# Patient Record
Sex: Female | Born: 1940 | Race: White | Hispanic: No | Smoking: Never smoker
Health system: Southern US, Community
[De-identification: ages and names within clinical notes are randomized; demographics above are authoritative.]

## PROBLEM LIST (undated history)

## (undated) DIAGNOSIS — E7800 Pure hypercholesterolemia, unspecified: Secondary | ICD-10-CM

## (undated) DIAGNOSIS — R0602 Shortness of breath: Secondary | ICD-10-CM

## (undated) DIAGNOSIS — F329 Major depressive disorder, single episode, unspecified: Secondary | ICD-10-CM

## (undated) DIAGNOSIS — M1990 Unspecified osteoarthritis, unspecified site: Secondary | ICD-10-CM

## (undated) DIAGNOSIS — E669 Obesity, unspecified: Secondary | ICD-10-CM

## (undated) DIAGNOSIS — R0600 Dyspnea, unspecified: Secondary | ICD-10-CM

## (undated) DIAGNOSIS — R0609 Other forms of dyspnea: Secondary | ICD-10-CM

## (undated) DIAGNOSIS — L409 Psoriasis, unspecified: Secondary | ICD-10-CM

## (undated) DIAGNOSIS — Z90710 Acquired absence of both cervix and uterus: Secondary | ICD-10-CM

## (undated) DIAGNOSIS — I10 Essential (primary) hypertension: Secondary | ICD-10-CM

## (undated) DIAGNOSIS — E119 Type 2 diabetes mellitus without complications: Secondary | ICD-10-CM

## (undated) DIAGNOSIS — L4050 Arthropathic psoriasis, unspecified: Secondary | ICD-10-CM

## (undated) DIAGNOSIS — I480 Paroxysmal atrial fibrillation: Secondary | ICD-10-CM

## (undated) DIAGNOSIS — H409 Unspecified glaucoma: Secondary | ICD-10-CM

## (undated) DIAGNOSIS — F32A Depression, unspecified: Secondary | ICD-10-CM

## (undated) HISTORY — DX: Psoriasis, unspecified: L40.9

## (undated) HISTORY — DX: Obesity, unspecified: E66.9

## (undated) HISTORY — PX: TUBAL LIGATION: SHX77

## (undated) HISTORY — DX: Dyspnea, unspecified: R06.00

## (undated) HISTORY — DX: Essential (primary) hypertension: I10

## (undated) HISTORY — PX: CARDIAC CATHETERIZATION: SHX172

## (undated) HISTORY — DX: Major depressive disorder, single episode, unspecified: F32.9

## (undated) HISTORY — DX: Paroxysmal atrial fibrillation: I48.0

## (undated) HISTORY — PX: JOINT REPLACEMENT: SHX530

## (undated) HISTORY — PX: TOTAL KNEE ARTHROPLASTY: SHX125

## (undated) HISTORY — PX: ABDOMINAL SURGERY: SHX537

## (undated) HISTORY — DX: Other forms of dyspnea: R06.09

## (undated) HISTORY — DX: Pure hypercholesterolemia, unspecified: E78.00

## (undated) HISTORY — DX: Unspecified glaucoma: H40.9

## (undated) HISTORY — DX: Acquired absence of both cervix and uterus: Z90.710

## (undated) HISTORY — DX: Arthropathic psoriasis, unspecified: L40.50

## (undated) HISTORY — DX: Shortness of breath: R06.02

## (undated) HISTORY — DX: Depression, unspecified: F32.A

## (undated) HISTORY — PX: APPENDECTOMY: SHX54

## (undated) HISTORY — DX: Type 2 diabetes mellitus without complications: E11.9

## (undated) HISTORY — PX: ABDOMINAL HYSTERECTOMY: SHX81

---

## 1997-11-15 ENCOUNTER — Other Ambulatory Visit: Admission: RE | Admit: 1997-11-15 | Discharge: 1997-11-15 | Payer: Self-pay | Admitting: *Deleted

## 1999-01-26 ENCOUNTER — Other Ambulatory Visit: Admission: RE | Admit: 1999-01-26 | Discharge: 1999-01-26 | Payer: Self-pay | Admitting: Internal Medicine

## 2008-01-12 ENCOUNTER — Encounter: Admission: RE | Admit: 2008-01-12 | Discharge: 2008-01-12 | Payer: Self-pay | Admitting: Cardiology

## 2008-02-12 ENCOUNTER — Inpatient Hospital Stay (HOSPITAL_BASED_OUTPATIENT_CLINIC_OR_DEPARTMENT_OTHER): Admission: RE | Admit: 2008-02-12 | Discharge: 2008-02-12 | Payer: Self-pay | Admitting: Cardiology

## 2010-11-07 NOTE — Cardiovascular Report (Signed)
NAMEHARSHITHA, Christina Bishop               ACCOUNT NO.:  [id number]   MEDICAL RECORD NO.:  [id number]          PATIENT TYPE:  OIB   LOCATION:  1963                         FACILITY:  [facility]   PHYSICIAN:  Georga Hacking, M.D.DATE OF BIRTH:  July 21, 1940   DATE OF PROCEDURE:  02/12/2008  DATE OF DISCHARGE:  02/12/2008                            CARDIAC CATHETERIZATION   HISTORY:  This is a 70 year old female with a history of diabetes,  hypertension, and hyperlipidemia with significant marital stress.  She  has developed worsening dyspnea on exertion with poorly-controlled  hypertension, began to have chest pain or shortness of breath.  She  presented to the office and had a Cardiolite stress test that showed  either a breast attenuation or anterior ischemia and a possibility of  transient cavity dilation.  She has a history of heart catheterization  several years ago and reported having had blockage, but we have not  received the records of this.   PROCEDURE:  Left heart catheterization with coronary angiograms and left  ventriculogram.   COMMENTS ABOUT PROCEDURE:  The patient was done in the outpatient  diagnostic laboratory.  The right femoral artery was entered using a  single anterior needle wall stick and a 4-French sheath was placed.  The  procedure was done with 4-French catheters and a 30 mL ventriculogram  was performed.  The sheath was removed in the holding area.  She  tolerated the procedure well.   HEMODYNAMIC DATA:  Aorta postcontrast 145/56, LV postcontrast 145/13-21.   ANGIOGRAPHIC DATA:  Left ventriculogram:  Performed in the 30-degree RAO  projection.  The aortic valve was normal.  Mitral valve was normal.  Left ventricle appears normal in size.  Estimated ejection fraction is  60%.  Coronary arteries arise and distribute normally.  No significant  coronary calcification is noted.  Left main coronary artery is normal.  Left anterior descending terminates on the distal  anterior wall.  There  is a small intermediate branch that arises that is free of disease.  Circumflex has 2 small marginal arteries and is free of disease.  Right  coronary artery is a large-dominant vessel.  The posterior descending  supplies the apex as does 2 posterolateral branches.  The 2  posterolateral branches are somewhat small and tortuous, but have no  severe obstructive stenoses noted.   IMPRESSION:  1. No significant obstructive coronary artery disease identified and      no coronary calcifications.  2. Normal left ventricular function.   RECOMMENDATIONS:  The patient had a recent echocardiogram and probably  has hypertensive heart disease as a cause for her dyspnea.  She has  poorly-controlled hypertension and will need better blood pressure  control.  She does not have obstructive coronary artery disease of  significance, may have hypertensive heart disease.      Georga Hacking, M.D.  Electronically Signed     WST/MEDQ  D:  02/12/2008  T:  02/12/2008  Job:  [id number]   cc:   Fara Chute

## 2010-11-10 NOTE — Cardiovascular Report (Signed)
NAMEPHILLIS, Christina Bishop               ACCOUNT NO.:  [id number]   MEDICAL RECORD NO.:  [id number]          PATIENT TYPE:  OIB   LOCATION:  1963                         FACILITY:  [facility]   PHYSICIAN:  Georga Hacking, M.D.DATE OF BIRTH:  13-Sep-1940   DATE OF PROCEDURE:  02/23/2008  DATE OF DISCHARGE:  02/12/2008                            CARDIAC CATHETERIZATION   HISTORY:  The patient is a 70 year old female with hypertension,  diabetes, and hyperlipidemia, has reported history of coronary disease.  She presented with the increasing exertional dyspnea.  Echocardiogram  shows moderate concentric LVH, but preserved systolic function.  Cardiolite testing showed breast attenuation versus anterior ischemia.  Catheterization was advised to assess for coronary disease.   PROCEDURE:  Left heart catheterization with coronary artery disease and  left ventriculogram.   COMMENTS ABOUT PROCEDURE:  The procedure was done in the outpatient  diagnostic laboratory.  The right femoral artery was entered using a  single anterior needle wall stick and 4-French sheath was inserted.  The  procedure was done with 4-French catheters and 30 mL ventriculogram was  performed.  She tolerated the procedure well.   HEMODYNAMIC DATA:  Aorta postcontrast 150/80, LV postcontrast 150/15-20.   ANGIOGRAPHIC DATA:  Left ventriculogram:  Performed in the 30-degree RAO  projection.  The aortic valve was normal.  The mitral valve was normal.  Left ventricle was normal in size.  Estimated ejection fraction is 60-  65%.  Coronary arteries arise and distribute normally.  There was no  significant coronary calcification identified.  The left main coronary  artery appears normal.  Left anterior descending has a larger diagonal  branch and the continuation branch of the LAD is somewhat smaller and  terminates on the apex.  There were scattered irregularities, but no  significant coronary obstructive disease is noted.  Circumflex  coronary  artery supplies 2 tortuous marginal arteries and is free of disease.  Right coronary artery is a large dominant vessel that appears free of  disease, posterior descending and large posterolateral branch supplied  by apex.   IMPRESSION:  1. Concentric left ventricular hypertrophy with normal systolic      function and increased left ventricular end-diastolic pressure.  2. No significant obstructive coronary artery disease was identified.   RECOMMENDATIONS:  Evaluate for other causes of dyspnea and risk factor  modification.  Weight loss, controlled with blood pressure.     Georga Hacking, M.D.  Electronically Signed    WST/MEDQ  D:  02/23/2008  T:  02/24/2008  Job:  [id number]   cc:   Fara Chute

## 2011-03-05 ENCOUNTER — Inpatient Hospital Stay (INDEPENDENT_AMBULATORY_CARE_PROVIDER_SITE_OTHER)
Admission: RE | Admit: 2011-03-05 | Discharge: 2011-03-05 | Disposition: A | Discharge status: Home / Self Care | Payer: Medicare Other | Source: Ambulatory Visit | Attending: Emergency Medicine | Admitting: Emergency Medicine

## 2011-03-05 DIAGNOSIS — IMO0002 Reserved for concepts with insufficient information to code with codable children: Secondary | ICD-10-CM

## 2011-03-05 DIAGNOSIS — S7010XA Contusion of unspecified thigh, initial encounter: Secondary | ICD-10-CM

## 2012-02-01 DIAGNOSIS — Z79899 Other long term (current) drug therapy: Secondary | ICD-10-CM | POA: Insufficient documentation

## 2012-02-01 DIAGNOSIS — H409 Unspecified glaucoma: Secondary | ICD-10-CM | POA: Insufficient documentation

## 2012-02-01 DIAGNOSIS — Z9889 Other specified postprocedural states: Secondary | ICD-10-CM

## 2012-02-01 DIAGNOSIS — L4050 Arthropathic psoriasis, unspecified: Secondary | ICD-10-CM | POA: Insufficient documentation

## 2012-02-01 DIAGNOSIS — F32A Depression, unspecified: Secondary | ICD-10-CM | POA: Insufficient documentation

## 2012-02-01 DIAGNOSIS — F329 Major depressive disorder, single episode, unspecified: Secondary | ICD-10-CM | POA: Insufficient documentation

## 2012-02-01 HISTORY — DX: Other specified postprocedural states: Z98.890

## 2012-02-01 HISTORY — DX: Other long term (current) drug therapy: Z79.899

## 2012-02-07 ENCOUNTER — Other Ambulatory Visit: Payer: Self-pay | Admitting: Obstetrics and Gynecology

## 2012-02-07 ENCOUNTER — Other Ambulatory Visit: Payer: Self-pay | Admitting: Family Medicine

## 2012-02-07 DIAGNOSIS — Z1231 Encounter for screening mammogram for malignant neoplasm of breast: Secondary | ICD-10-CM

## 2012-02-22 ENCOUNTER — Ambulatory Visit
Admission: RE | Admit: 2012-02-22 | Discharge: 2012-02-22 | Disposition: A | Discharge status: Home / Self Care | Payer: Medicare Other | Source: Ambulatory Visit | Attending: Family Medicine | Admitting: Family Medicine

## 2012-02-22 DIAGNOSIS — Z1231 Encounter for screening mammogram for malignant neoplasm of breast: Secondary | ICD-10-CM

## 2012-02-28 ENCOUNTER — Encounter: Payer: Self-pay | Admitting: Cardiology

## 2012-02-28 ENCOUNTER — Ambulatory Visit (INDEPENDENT_AMBULATORY_CARE_PROVIDER_SITE_OTHER): Payer: Medicare Other | Admitting: Cardiology

## 2012-02-28 VITALS — BP 110/74 | HR 52 | Ht 59.0 in | Wt 172.1 lb

## 2012-02-28 DIAGNOSIS — E1169 Type 2 diabetes mellitus with other specified complication: Secondary | ICD-10-CM

## 2012-02-28 DIAGNOSIS — I10 Essential (primary) hypertension: Secondary | ICD-10-CM

## 2012-02-28 DIAGNOSIS — E119 Type 2 diabetes mellitus without complications: Secondary | ICD-10-CM

## 2012-02-28 DIAGNOSIS — R0989 Other specified symptoms and signs involving the circulatory and respiratory systems: Secondary | ICD-10-CM

## 2012-02-28 DIAGNOSIS — E7800 Pure hypercholesterolemia, unspecified: Secondary | ICD-10-CM

## 2012-02-28 DIAGNOSIS — R0602 Shortness of breath: Secondary | ICD-10-CM

## 2012-02-28 DIAGNOSIS — R0600 Dyspnea, unspecified: Secondary | ICD-10-CM | POA: Insufficient documentation

## 2012-02-28 DIAGNOSIS — E669 Obesity, unspecified: Secondary | ICD-10-CM

## 2012-02-28 NOTE — Progress Notes (Signed)
Christina Bishop Date of Birth: November 26, 1940 Medical Record #[id number]  History of Present Illness: Christina Bishop is seen at the request of Cameron Sprang NP for evaluation of dyspnea. She is a pleasant 71 year old white female who states that she has been short of breath for years but that it is getting worse. She denies any chest pain. She gets short of breath now just walking across the room. Her feet swell every once in a while but this has not been a significant problem for her. Her weight has been stable over the past year. She admits that she is very sedentary and that she loves her recliner. She has had previous cardiac evaluation with cardiac catheterization in 2003 while in [place]. She also had a cardiac catheterization here in August of 2009 which was normal. She does have a history of diabetes mellitus type 2, hypertension, and hyperlipidemia. She is a nonsmoker. Current Outpatient Prescriptions on File Prior to Visit  Medication Sig Dispense Refill  . amLODipine (NORVASC) 10 MG tablet Take 10 mg by mouth daily.       Marland Kitchen atorvastatin (LIPITOR) 20 MG tablet Take by mouth daily.       . cloNIDine (CATAPRES) 0.1 MG tablet Take 0.1 mg by mouth at bedtime.      Marland Kitchen glipiZIDE (GLUCOTROL XL) 10 MG 24 hr tablet Take 10 mg by mouth daily with breakfast.       . lisinopril-hydrochlorothiazide (PRINZIDE,ZESTORETIC) 10-12.5 MG per tablet Take 1 tablet by mouth daily.       Marland Kitchen NEXIUM 40 MG capsule Take 40 mg by mouth as directed.       . ONGLYZA 5 MG TABS tablet Take 5 mg by mouth daily.       . sertraline (ZOLOFT) 100 MG tablet Take 100 mg by mouth daily.       Marland Kitchen zolpidem (AMBIEN) 5 MG tablet Take 5 mg by mouth at bedtime as needed.         Allergies  Allergen Reactions  . Codeine Itching    Past Medical History  Diagnosis Date  . DM2 (diabetes mellitus, type 2)     Uncontrolled  . HTN (hypertension)   . Psoriasis   . Psoriatic arthritis   . H/O: hysterectomy   . Depression   . SOB  (shortness of breath) on exertion   . Glaucoma   . Hypercholesterolemia     Past Surgical History  Procedure Date  . Total knee arthroplasty   . Cardiac catheterization     History  Smoking status  . Never Smoker   Smokeless tobacco  . Not on file    History  Alcohol Use No    Family History  Problem Relation Age of Onset  . Stroke Mother   . Heart attack Father     Review of Systems: The review of systems is positive for a mild chest cold that started 2 days ago. She has chronic insomnia for which she takes Ambien. She was recently started on statin therapy and her glipizide dose was increased. She reports that compared to 10-12 years ago she has gained a lot of weight. All other systems were reviewed and are negative.  Physical Exam: BP 110/74  Pulse 52  Ht 4\' 11"  (1.499 m)  Wt 78.073 kg (172 lb 1.9 oz)  BMI 34.76 kg/m2 She is a very pleasant but morbidly obese white female in no acute distress.The patient is alert and oriented x 3.  The mood and affect  are normal.  The skin is warm and dry.  Color is normal.  The HEENT exam reveals that the sclera are nonicteric.  The mucous membranes are moist.  The carotids are 2+ without bruits.  There is no thyromegaly.  There is no JVD.  The lungs are clear.  The chest wall is non tender.  The heart exam reveals a regular rate with a normal S1 and S2.  There are no murmurs, gallops, or rubs.  The PMI is not displaced.   Abdominal exam reveals good bowel sounds.  There is no guarding or rebound.  There is no hepatosplenomegaly or tenderness.  There are no masses.  Exam of the legs reveal no clubbing, cyanosis, or edema.  The legs are without rashes.  The distal pulses are intact.  Cranial nerves II - XII are intact.  Motor and sensory functions are intact.  The gait is normal.  LABORATORY DATA: ECG today demonstrates sinus bradycardia with sinus arrhythmia a rate of 52 beats per minute. It is otherwise normal. Laboratory data from  02/01/2012 shows a cholesterol of 262, triglycerides 135, LDL 157, HDL 78. Glucose of 174. BUN 31, creatinine 1.05. TSH was normal. Calcium was 10.3. Other chemistries were normal. Urinalysis was normal. CBC was normal. A1c was 7.6%.  Assessment / Plan: 1. Dyspnea. I suspect this is predominantly related to deconditioning and/or pulmonary disease. Prior cardiac evaluation has been unremarkable. She had a normal cardiac catheterization in 2009. We will update an echocardiogram. I'll also schedule her for complete pulmonary function testing. If the studies are unremarkable I will recommend a regular aerobic exercise program and weight loss.  2. Hypertension, controlled. Next  3. Hypercholesterolemia, moderate. Now atorvastatin.  4. Diabetes mellitus type 2.  5. Morbid obesity with significant deconditioning. May need to consider sleep apnea in differential. If above studies are negative we will consider a sleep study.

## 2012-02-28 NOTE — Patient Instructions (Addendum)
We will schedule you for an Echocardiogram and pulmonary function tests  Continue your current medication.  I will see you back in 2 months.

## 2012-03-05 ENCOUNTER — Other Ambulatory Visit (HOSPITAL_COMMUNITY): Payer: Medicare Other

## 2012-03-07 ENCOUNTER — Ambulatory Visit (INDEPENDENT_AMBULATORY_CARE_PROVIDER_SITE_OTHER): Payer: Medicare Other | Admitting: Internal Medicine

## 2012-03-07 DIAGNOSIS — R0609 Other forms of dyspnea: Secondary | ICD-10-CM

## 2012-03-07 DIAGNOSIS — R0989 Other specified symptoms and signs involving the circulatory and respiratory systems: Secondary | ICD-10-CM

## 2012-03-07 DIAGNOSIS — R0600 Dyspnea, unspecified: Secondary | ICD-10-CM

## 2012-03-07 DIAGNOSIS — I10 Essential (primary) hypertension: Secondary | ICD-10-CM

## 2012-03-07 DIAGNOSIS — E669 Obesity, unspecified: Secondary | ICD-10-CM

## 2012-03-07 DIAGNOSIS — E1169 Type 2 diabetes mellitus with other specified complication: Secondary | ICD-10-CM

## 2012-03-07 LAB — PULMONARY FUNCTION TEST

## 2012-03-07 NOTE — Progress Notes (Signed)
PFT done today. 

## 2012-03-18 ENCOUNTER — Telehealth: Payer: Self-pay

## 2012-03-18 ENCOUNTER — Ambulatory Visit (HOSPITAL_COMMUNITY): Payer: Medicare Other | Attending: Cardiology | Admitting: Radiology

## 2012-03-18 ENCOUNTER — Other Ambulatory Visit: Payer: Self-pay

## 2012-03-18 DIAGNOSIS — R0989 Other specified symptoms and signs involving the circulatory and respiratory systems: Secondary | ICD-10-CM | POA: Insufficient documentation

## 2012-03-18 DIAGNOSIS — E119 Type 2 diabetes mellitus without complications: Secondary | ICD-10-CM | POA: Insufficient documentation

## 2012-03-18 DIAGNOSIS — E1169 Type 2 diabetes mellitus with other specified complication: Secondary | ICD-10-CM

## 2012-03-18 DIAGNOSIS — R0609 Other forms of dyspnea: Secondary | ICD-10-CM | POA: Insufficient documentation

## 2012-03-18 DIAGNOSIS — E669 Obesity, unspecified: Secondary | ICD-10-CM

## 2012-03-18 DIAGNOSIS — I379 Nonrheumatic pulmonary valve disorder, unspecified: Secondary | ICD-10-CM | POA: Insufficient documentation

## 2012-03-18 DIAGNOSIS — R0600 Dyspnea, unspecified: Secondary | ICD-10-CM

## 2012-03-18 DIAGNOSIS — I059 Rheumatic mitral valve disease, unspecified: Secondary | ICD-10-CM | POA: Insufficient documentation

## 2012-03-18 DIAGNOSIS — I10 Essential (primary) hypertension: Secondary | ICD-10-CM | POA: Insufficient documentation

## 2012-03-18 DIAGNOSIS — I369 Nonrheumatic tricuspid valve disorder, unspecified: Secondary | ICD-10-CM | POA: Insufficient documentation

## 2012-03-18 NOTE — Progress Notes (Signed)
Echocardiogram performed.  

## 2012-03-18 NOTE — Telephone Encounter (Signed)
Patient called was told PFT's were okay.

## 2012-04-29 ENCOUNTER — Encounter: Payer: Self-pay | Admitting: Cardiology

## 2012-04-29 ENCOUNTER — Ambulatory Visit (INDEPENDENT_AMBULATORY_CARE_PROVIDER_SITE_OTHER): Payer: Medicare Other | Admitting: Cardiology

## 2012-04-29 VITALS — BP 120/66 | HR 64 | Ht <= 58 in | Wt 174.8 lb

## 2012-04-29 DIAGNOSIS — R0602 Shortness of breath: Secondary | ICD-10-CM

## 2012-04-29 DIAGNOSIS — E7800 Pure hypercholesterolemia, unspecified: Secondary | ICD-10-CM

## 2012-04-29 DIAGNOSIS — I10 Essential (primary) hypertension: Secondary | ICD-10-CM

## 2012-04-29 NOTE — Patient Instructions (Signed)
Continue to try and lose weight.  Exercise walking daily.  I will see you as needed.

## 2012-04-29 NOTE — Progress Notes (Signed)
Christina Bishop Date of Birth: [birth year] Medical Record #[id number]  History of Present Illness: Christina Bishop is seen for followup of her dyspnea. She states she is still short of breath is unchanged from her prior visit. She denies any cough. She has no edema or orthopnea.  Current Outpatient Prescriptions on File Prior to Visit  Medication Sig Dispense Refill  . amLODipine (NORVASC) 10 MG tablet Take 10 mg by mouth daily.       Marland Kitchen atorvastatin (LIPITOR) 20 MG tablet Take by mouth daily.       . Brimonidine Tartrate-Timolol (COMBIGAN OP) Apply to eye as directed.      . cloNIDine (CATAPRES) 0.1 MG tablet Take 0.1 mg by mouth at bedtime.      Marland Kitchen glipiZIDE (GLUCOTROL XL) 10 MG 24 hr tablet Take 10 mg by mouth daily with breakfast.       . lisinopril-hydrochlorothiazide (PRINZIDE,ZESTORETIC) 10-12.5 MG per tablet Take 1 tablet by mouth daily.       Marland Kitchen NEXIUM 40 MG capsule Take 40 mg by mouth as directed.       . NON FORMULARY Part of a drug study and think medication she is on is welbutrin      . ONGLYZA 5 MG TABS tablet Take 5 mg by mouth daily.       . sertraline (ZOLOFT) 100 MG tablet Take 100 mg by mouth daily.       . travoprost, benzalkonium, (TRAVATAN) 0.004 % ophthalmic solution Place 1 drop into both eyes at bedtime.      Marland Kitchen zolpidem (AMBIEN) 5 MG tablet Take 5 mg by mouth at bedtime as needed.         Allergies  Allergen Reactions  . Codeine Itching    Past Medical History  Diagnosis Date  . DM2 (diabetes mellitus, type 2)     Uncontrolled  . HTN (hypertension)   . Psoriasis   . Psoriatic arthritis   . H/O: hysterectomy   . Depression   . SOB (shortness of breath) on exertion   . Glaucoma   . Hypercholesterolemia   . Obesity     Past Surgical History  Procedure Date  . Total knee arthroplasty   . Cardiac catheterization     History  Smoking status  . Never Smoker   Smokeless tobacco  . Not on file    History  Alcohol Use No    Family History  Problem  Relation Age of Onset  . Stroke Mother   . Heart attack Father     Review of Systems: As noted in history of present illness. All other systems were reviewed and are negative.  Physical Exam: BP 120/66  Pulse 64  Ht 4\' 10"  (1.473 m)  Wt 79.289 kg (174 lb 12.8 oz)  BMI 36.53 kg/m2 She is a very pleasant but morbidly obese white female in no acute distress. The HEENT exam is normal.  The carotids are 2+ without bruits.  There is no thyromegaly.  There is no JVD.  The lungs are clear.  The chest wall is non tender.  The heart exam reveals a regular rate with a normal S1 and S2.  There are no murmurs, gallops, or rubs.  The PMI is not displaced.   Abdominal exam is obese. There are no masses.  Exam of the legs reveal no clubbing, cyanosis, or edema.  The legs are without rashes.  The distal pulses are intact.  Cranial nerves II - XII are intact.  Motor and sensory functions are intact.  The gait is normal.  LABORATORY DATA: Recent pulmonary function studies showed mild obstructive deficit with some response to bronchodilators. Diffusion capacity was moderately reduced. Next  Echo:Study Conclusions  Left ventricle: The cavity size was normal. Wall thickness was normal. Systolic function was normal. The estimated ejection fraction was in the range of 55% to 60%. Wall motion was normal; there were no regional wall motion abnormalities. Doppler parameters are consistent with abnormal left ventricular relaxation (grade 1 diastolic dysfunction).    Assessment / Plan: 1. Dyspnea. Based on her evaluation today I do not feel that her symptoms are cardiac related. Her echocardiogram was unremarkable. She had a normal cardiac catheterization in 2009. Pulmonary function studies are mildly impaired. Patient states she does not think she has sleep apnea. She is reassured by the above findings. She would like to manage conservatively. I strongly recommended weight loss and regular aerobic exercise. If  she continues to have significant dyspnea I would recommend pulmonary evaluation. I will see back as needed.  2. Hypertension, controlled.   3. Hypercholesterolemia, moderate. Now atorvastatin.  4. Diabetes mellitus type 2.  5. Morbid obesity with significant deconditioning.

## 2013-02-03 ENCOUNTER — Other Ambulatory Visit: Payer: Self-pay

## 2013-02-03 DIAGNOSIS — Z1231 Encounter for screening mammogram for malignant neoplasm of breast: Secondary | ICD-10-CM

## 2013-02-13 ENCOUNTER — Ambulatory Visit (INDEPENDENT_AMBULATORY_CARE_PROVIDER_SITE_OTHER): Payer: Medicare Other | Admitting: Internal Medicine

## 2013-02-13 ENCOUNTER — Encounter: Payer: Self-pay | Admitting: Internal Medicine

## 2013-02-13 VITALS — BP 122/74 | HR 78 | Temp 98.7°F | Ht <= 58 in | Wt 170.0 lb

## 2013-02-13 DIAGNOSIS — I10 Essential (primary) hypertension: Secondary | ICD-10-CM

## 2013-02-13 DIAGNOSIS — R0602 Shortness of breath: Secondary | ICD-10-CM

## 2013-02-13 MED ORDER — LOSARTAN POTASSIUM-HCTZ 50-12.5 MG PO TABS: 1.0000 | ORAL_TABLET | Freq: Every day | ORAL | Status: DC

## 2013-02-13 NOTE — Progress Notes (Signed)
  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  Already eval  02/2012 by Dr Peter [place] with neg cardiac w/u.  No obvious day to day or  daytime variabilty or assoc cp or chest tightness, subjective wheeze overt sinus or hb symptoms. No unusual exp hx or h/o childhood pna/ asthma or knowledge of premature birth.   Sleeping ok without nocturnal  or early am exacerbation  of respiratory  c/o's or need for noct saba. Also denies any obvious fluctuation of symptoms with weather or environmental changes or other aggravating or alleviating factors except as outlined above   Current Medications, Allergies, Past Medical History, Past Surgical History, Family History, and Social History were reviewed in [employer] record.    Review of Systems  Constitutional: Negative for fever, chills, diaphoresis, activity change, appetite change, fatigue and unexpected weight change.  HENT: Positive for ear pain. Negative for hearing loss, nosebleeds, congestion, sore throat, facial swelling, rhinorrhea, sneezing, mouth sores, trouble swallowing, neck pain, neck stiffness, dental problem, voice change, postnasal drip, sinus pressure, tinnitus and ear discharge.   Eyes: Negative for photophobia, discharge, itching and visual disturbance.  Respiratory: Positive for cough. Negative for apnea, choking, chest tightness, shortness of breath, wheezing and stridor.   Cardiovascular: Negative for chest pain, palpitations and leg  swelling.  Gastrointestinal: Negative for nausea, vomiting, abdominal pain, constipation, blood in stool and abdominal distention.       Indigestion  Genitourinary: Negative for dysuria, urgency, frequency, hematuria, flank pain, decreased urine volume and difficulty urinating.  Musculoskeletal: Positive for myalgias. Negative for back pain, joint swelling, arthralgias and gait problem.  Skin: Positive for rash. Negative for color change and pallor.  Neurological: Negative for dizziness, tremors, seizures, syncope, speech difficulty, weakness, light-headedness, numbness and headaches.  Hematological: Negative for adenopathy. Does not bruise/bleed easily.  Psychiatric/Behavioral: Positive for dysphoric mood. Negative for confusion, sleep disturbance and agitation. The patient is nervous/anxious.        Objective:   Physical Exam  amb wf nad  Wt Readings from Last 3 Encounters:  02/13/13 170 lb (77.111 kg)  04/29/12 174 lb 12.8 oz (79.289 kg)  02/28/12 172 lb 1.9 oz (78.073 kg)    HEENT: nl dentition, turbinates, and orophanx. Nl external ear canals without cough reflex   NECK :  without JVD/Nodes/TM/ nl carotid upstrokes bilaterally   LUNGS: no acc muscle use, clear to A and P bilaterally without cough on insp or exp maneuvers   CV:  RRR  no s3 or murmur or increase in P2, no edema   ABD:  soft and nontender with nl excursion in the supine position. No bruits or organomegaly, bowel sounds nl  MS:  warm without deformities, calf tenderness, cyanosis or clubbing  SKIN: warm and dry without lesions    NEURO:  alert, approp, no deficits          Assessment & Plan:

## 2013-02-13 NOTE — Patient Instructions (Addendum)
Stop lisinopril  Start losartan 50/12.5 one daily in its place  GERD (REFLUX)  is an extremely common cause of respiratory symptoms just like yours, many times with no significant heartburn at all.    It can be treated with medication, but also with lifestyle changes including avoidance of late meals, excessive alcohol, smoking cessation, and avoid fatty foods, chocolate, peppermint, colas, red wine, and acidic juices such as orange juice.  NO MINT OR MENTHOL PRODUCTS SO NO COUGH DROPS  USE SUGARLESS CANDY INSTEAD (jolley ranchers or Stover's)  NO OIL BASED VITAMINS - use powdered substitutes.  Please schedule a follow up office visit in 6 weeks, call sooner if needed

## 2013-02-14 NOTE — Assessment & Plan Note (Addendum)
-   PFT's 03/07/12 FEV1  1.28 ( 80%) ratio 70 and DLCO 53 corrects to 104  ERV 39% c/w  normal lungs but extrinsic effects of obesity - 02/13/2013  Walked RA x 3 laps @ 185 ft each stopped due to end of study no desat   Symptoms are markedly disproportionate to objective findings and not clear this is a lung problem but pt does appear to have difficult airway management issues.  DDX of  difficult airways managment all start with A and  include Adherence, Ace Inhibitors, Acid Reflux, Active Sinus Disease, Alpha 1 Antitripsin deficiency, Anxiety masquerading as Airways dz,  ABPA,  allergy(esp in young), Aspiration (esp in elderly), Adverse effects of DPI,  Active smokers, plus two Bs  = Bronchiectasis and Beta blocker use..and one C= CHF  acei at top of list based on previous studies showing no airflow obst and assoc dry cough.  Acid reflux related to obesity/ aging also a concern > diet reviewed in addition to timing of daily ppi maint rx which she's already doing  Will ask her to return in 6 weeks to regroup

## 2013-02-14 NOTE — Assessment & Plan Note (Addendum)
ACE inhibitors are problematic in  pts with airway complaints because  even experienced pulmonologists can't always distinguish ace effects from copd/asthma.  By themselves they don't actually cause a problem, much like oxygen can't by itself start a fire, but they certainly serve as a powerful catalyst or enhancer for any "fire"  or inflammatory process in the upper airway, be it caused by an ET  tube or more commonly reflux (especially in the obese or pts with known GERD (as is the case here and can be a problem triggering ace intolerance even in the absence of HB)  or who are on biphoshonates).    In the era of ARB near equivalency ( at least in short run)  until we have a better handle on the reversibility of the airway problem, it just makes sense to avoid ACEI  entirely in the short run and then decide later, having established a level of airway control using a reasonable limited regimen, whether to add back ace but even then being very careful to observe the pt for worsening airway control and number of meds used/ needed to control symptoms.    Stop lisinopril  Start trial losartan 50/12.5 one daily in its place

## 2013-03-02 ENCOUNTER — Ambulatory Visit: Payer: Medicare Other

## 2013-03-27 ENCOUNTER — Encounter: Payer: Self-pay | Admitting: Internal Medicine

## 2013-03-27 ENCOUNTER — Ambulatory Visit (INDEPENDENT_AMBULATORY_CARE_PROVIDER_SITE_OTHER): Payer: Medicare Other | Admitting: Internal Medicine

## 2013-03-27 VITALS — BP 118/66 | HR 65 | Temp 98.1°F | Ht 58.5 in | Wt 173.0 lb

## 2013-03-27 DIAGNOSIS — R0602 Shortness of breath: Secondary | ICD-10-CM

## 2013-03-27 DIAGNOSIS — I10 Essential (primary) hypertension: Secondary | ICD-10-CM

## 2013-03-27 DIAGNOSIS — K219 Gastro-esophageal reflux disease without esophagitis: Secondary | ICD-10-CM

## 2013-03-27 HISTORY — DX: Gastro-esophageal reflux disease without esophagitis: K21.9

## 2013-03-27 NOTE — Patient Instructions (Addendum)
If you are satisfied with your treatment plan let your doctor know and he/she can either refill your medications or you can return here when your prescription runs out.     If in any way you are not 100% satisfied,  please tell us.  If 100% better, tell your friends!  

## 2013-03-27 NOTE — Assessment & Plan Note (Addendum)
-   PFT's 03/07/12 FEV1  1.28 ( 80%) ratio 70 and DLCO 53 corrects to 104  ERV 39%  - 02/13/2013  Walked RA x 3 laps @ 185 ft each stopped due to end of study no desat  - Trial off ACEi 02/14/2013 > all symptoms resolved   Clearly this was an usual ace case, resolved off acei ( which probably served to accentuate the uppers airway  effects of acid reflux, which she still has but much milder off acei)  no further pulmonary f/u needed    Each maintenance medication was reviewed in detail including most importantly the difference between maintenance and as needed and under what circumstances the prns are to be used.  Please see instructions for details which were reviewed in writing and the patient given a copy.

## 2013-03-27 NOTE — Progress Notes (Signed)
  Subjective:    Patient ID: Christina Bishop, female    DOB: 17-Jul-1940 MRN: [id number]    Brief patient profile:  66 yowf never smoker with h/o psoriatic arthritis rx in [facility] by dermatologist referred 02/13/2013 by Dr Florentina Addison bland for doe onset 2009  History of Present Illness  02/13/2013 1st [facility] office visit/ Wert on ACEi cc indolent onset doe x 5 years mildly progressive to point where walking to [employer] at moderate pace about a block away from appt up slt inclline on way back  and steps stops p one flight and rests, assoc with variable dry cough.  Already eval  02/2012 by Dr Peter [place] with neg cardiac w/u. rec Stop lisinopril Start losartan 50/12.5 one daily in its place GERD diet    03/27/2013 f/u ov/Wert re: sob ? acei side effect Chief Complaint  Patient presents with  . Follow-up    Pt states her breathing is much improved- "I feel like a new person". No co's today.    not limited at all by sob    No obvious day to day or daytime variabilty or assoc chronic cough or cp or chest tightness, subjective wheeze overt sinus or hb symptoms. No unusual exp hx or h/o childhood pna/ asthma or knowledge of premature birth.  Sleeping ok without nocturnal  or early am exacerbation  of respiratory  c/o's or need for noct saba. Also denies any obvious fluctuation of symptoms with weather or environmental changes or other aggravating or alleviating factors except as outlined above   Current Medications, Allergies, Complete Past Medical History, Past Surgical History, Family History, and Social History were reviewed in [employer] record.  ROS  The following are not active complaints unless bolded sore throat, dysphagia, dental problems, itching, sneezing,  nasal congestion or excess/ purulent secretions, ear ache,   fever, chills, sweats, unintended wt loss, pleuritic or exertional cp, hemoptysis,  orthopnea pnd or leg swelling, presyncope,  palpitations, heartburn, abdominal pain, anorexia, nausea, vomiting, diarrhea  or change in bowel or urinary habits, change in stools or urine, dysuria,hematuria,  rash, arthralgias, visual complaints, headache, numbness weakness or ataxia or problems with walking or coordination,  change in mood/affect or memory.              Objective:   Physical Exam  amb wf nad   03/27/2013       173  Wt Readings from Last 3 Encounters:  02/13/13 170 lb (77.111 kg)  04/29/12 174 lb 12.8 oz (79.289 kg)  02/28/12 172 lb 1.9 oz (78.073 kg)    HEENT: nl dentition, turbinates, and orophanx. Nl external ear canals without cough reflex   NECK :  without JVD/Nodes/TM/ nl carotid upstrokes bilaterally   LUNGS: no acc muscle use, clear to A and P bilaterally without cough on insp or exp maneuvers   CV:  RRR  no s3 or murmur or increase in P2, no edema   ABD:  soft and nontender with nl excursion in the supine position. No bruits or organomegaly, bowel sounds nl  MS:  warm without deformities, calf tenderness, cyanosis or clubbing  SKIN: warm and dry without lesions    NEURO:  alert, approp, no deficits          Assessment & Plan:

## 2013-03-27 NOTE — Assessment & Plan Note (Signed)
She says still has over hb when misses a dose of nexium but not really following diet.  On diet and off acei she may be able to wean off nexium or use cheaper alternative but defer rx to Dr Lowella Bandy

## 2013-03-27 NOTE — Assessment & Plan Note (Signed)
Adequate control on present rx, reviewed > no change in rx needed    rec avoid acei indefinitely

## 2013-04-07 ENCOUNTER — Ambulatory Visit: Payer: Medicare Other

## 2013-04-29 ENCOUNTER — Ambulatory Visit: Payer: Medicare Other

## 2014-03-26 ENCOUNTER — Observation Stay (HOSPITAL_COMMUNITY)
Admission: EM | Admit: 2014-03-26 | Discharge: 2014-03-27 | Disposition: A | Discharge status: Home / Self Care | Payer: Medicare Other | Attending: Internal Medicine | Admitting: Internal Medicine

## 2014-03-26 ENCOUNTER — Encounter (HOSPITAL_COMMUNITY): Payer: Self-pay | Admitting: Emergency Medicine

## 2014-03-26 DIAGNOSIS — E669 Obesity, unspecified: Secondary | ICD-10-CM | POA: Insufficient documentation

## 2014-03-26 DIAGNOSIS — H409 Unspecified glaucoma: Secondary | ICD-10-CM | POA: Diagnosis not present

## 2014-03-26 DIAGNOSIS — E7800 Pure hypercholesterolemia, unspecified: Secondary | ICD-10-CM | POA: Diagnosis present

## 2014-03-26 DIAGNOSIS — I10 Essential (primary) hypertension: Secondary | ICD-10-CM | POA: Diagnosis not present

## 2014-03-26 DIAGNOSIS — E119 Type 2 diabetes mellitus without complications: Secondary | ICD-10-CM | POA: Diagnosis not present

## 2014-03-26 DIAGNOSIS — I4891 Unspecified atrial fibrillation: Secondary | ICD-10-CM | POA: Diagnosis not present

## 2014-03-26 DIAGNOSIS — Z794 Long term (current) use of insulin: Secondary | ICD-10-CM | POA: Insufficient documentation

## 2014-03-26 DIAGNOSIS — K219 Gastro-esophageal reflux disease without esophagitis: Secondary | ICD-10-CM | POA: Diagnosis present

## 2014-03-26 DIAGNOSIS — R55 Syncope and collapse: Principal | ICD-10-CM | POA: Diagnosis present

## 2014-03-26 DIAGNOSIS — Z872 Personal history of diseases of the skin and subcutaneous tissue: Secondary | ICD-10-CM | POA: Diagnosis not present

## 2014-03-26 DIAGNOSIS — Z90710 Acquired absence of both cervix and uterus: Secondary | ICD-10-CM | POA: Insufficient documentation

## 2014-03-26 DIAGNOSIS — Z79899 Other long term (current) drug therapy: Secondary | ICD-10-CM | POA: Insufficient documentation

## 2014-03-26 DIAGNOSIS — F329 Major depressive disorder, single episode, unspecified: Secondary | ICD-10-CM | POA: Insufficient documentation

## 2014-03-26 DIAGNOSIS — Z9889 Other specified postprocedural states: Secondary | ICD-10-CM | POA: Diagnosis not present

## 2014-03-26 DIAGNOSIS — R112 Nausea with vomiting, unspecified: Secondary | ICD-10-CM | POA: Insufficient documentation

## 2014-03-26 DIAGNOSIS — I480 Paroxysmal atrial fibrillation: Secondary | ICD-10-CM

## 2014-03-26 HISTORY — DX: Unspecified atrial fibrillation: I48.91

## 2014-03-26 LAB — CBC
HCT: 42.5 % (ref 36.0–46.0)
Hemoglobin: 14.7 g/dL (ref 12.0–15.0)
MCH: 28.4 pg (ref 26.0–34.0)
MCHC: 34.6 g/dL (ref 30.0–36.0)
MCV: 82.2 fL (ref 78.0–100.0)
Platelets: 310 10*3/uL (ref 150–400)
RBC: 5.17 MIL/uL — ABNORMAL HIGH (ref 3.87–5.11)
RDW: 13.1 % (ref 11.5–15.5)
WBC: 10.3 10*3/uL (ref 4.0–10.5)

## 2014-03-26 LAB — BASIC METABOLIC PANEL
Anion gap: 15 (ref 5–15)
BUN: 15 mg/dL (ref 6–23)
CO2: 26 mEq/L (ref 19–32)
Calcium: 9.5 mg/dL (ref 8.4–10.5)
Chloride: 99 mEq/L (ref 96–112)
Creatinine, Ser: 0.85 mg/dL (ref 0.50–1.10)
GFR calc Af Amer: 78 mL/min — ABNORMAL LOW (ref >90)
GFR calc non Af Amer: 67 mL/min — ABNORMAL LOW (ref >90)
Glucose, Bld: 258 mg/dL — ABNORMAL HIGH (ref 70–99)
Potassium: 3.9 mEq/L (ref 3.7–5.3)
Sodium: 140 mEq/L (ref 137–147)

## 2014-03-26 LAB — URINALYSIS, ROUTINE W REFLEX MICROSCOPIC
Bilirubin Urine: NEGATIVE
Glucose, UA: 250 mg/dL — AB
Hgb urine dipstick: NEGATIVE
Ketones, ur: 15 mg/dL — AB
Leukocytes, UA: NEGATIVE
Nitrite: NEGATIVE
Protein, ur: NEGATIVE mg/dL
Specific Gravity, Urine: 1.024 (ref 1.005–1.030)
Urobilinogen, UA: 0.2 mg/dL (ref 0.0–1.0)
pH: 6.5 (ref 5.0–8.0)

## 2014-03-26 LAB — TROPONIN I: Troponin I: <0.3 ng/mL (ref <0.30)

## 2014-03-26 LAB — I-STAT TROPONIN, ED: Troponin i, poc: 0 ng/mL (ref 0.00–0.08)

## 2014-03-26 LAB — TSH: TSH: 4.01 u[IU]/mL (ref 0.350–4.500)

## 2014-03-26 LAB — GLUCOSE, CAPILLARY: Glucose-Capillary: 154 mg/dL — ABNORMAL HIGH (ref 70–99)

## 2014-03-26 LAB — CBG MONITORING, ED: Glucose-Capillary: 285 mg/dL — ABNORMAL HIGH (ref 70–99)

## 2014-03-26 MED ORDER — INSULIN ASPART 100 UNIT/ML ~~LOC~~ SOLN
0.0000 [IU] | Freq: Three times a day (TID) | SUBCUTANEOUS | Status: DC
  Administered 2014-03-27: 3 [IU] via SUBCUTANEOUS

## 2014-03-26 MED ORDER — SERTRALINE HCL 100 MG PO TABS
100.0000 mg | ORAL_TABLET | Freq: Every day | ORAL | Status: DC
  Administered 2014-03-26 – 2014-03-27 (×2): 100 mg via ORAL
  Filled 2014-03-26 (×2): qty 1

## 2014-03-26 MED ORDER — LOSARTAN POTASSIUM 50 MG PO TABS
50.0000 mg | ORAL_TABLET | Freq: Every day | ORAL | Status: DC
Start: 2014-03-27 — End: 2014-03-27
  Administered 2014-03-27: 50 mg via ORAL
  Filled 2014-03-26: qty 1

## 2014-03-26 MED ORDER — HYDROCHLOROTHIAZIDE 12.5 MG PO CAPS
12.5000 mg | ORAL_CAPSULE | Freq: Every day | ORAL | Status: DC
  Administered 2014-03-27: 12.5 mg via ORAL
  Filled 2014-03-26: qty 1

## 2014-03-26 MED ORDER — OFF THE BEAT BOOK
Freq: Once | Status: AC
  Administered 2014-03-26: 22:00:00
  Filled 2014-03-26: qty 1

## 2014-03-26 MED ORDER — PANTOPRAZOLE SODIUM 40 MG PO TBEC
80.0000 mg | DELAYED_RELEASE_TABLET | Freq: Every day | ORAL | Status: DC
  Administered 2014-03-27: 80 mg via ORAL
  Filled 2014-03-26: qty 2

## 2014-03-26 MED ORDER — TRAVOPROST 0.004 % OP SOLN: 1.0000 [drp] | Freq: Every day | OPHTHALMIC | Status: DC

## 2014-03-26 MED ORDER — INSULIN DETEMIR 100 UNIT/ML FLEXPEN: 12.0000 [IU] | Freq: Every day | SUBCUTANEOUS | Status: DC

## 2014-03-26 MED ORDER — ATORVASTATIN CALCIUM 20 MG PO TABS
20.0000 mg | ORAL_TABLET | Freq: Every day | ORAL | Status: DC
  Administered 2014-03-26: 20 mg via ORAL
  Filled 2014-03-26 (×2): qty 1

## 2014-03-26 MED ORDER — GLIPIZIDE 10 MG PO TABS
10.0000 mg | ORAL_TABLET | Freq: Two times a day (BID) | ORAL | Status: DC
  Administered 2014-03-27: 10 mg via ORAL
  Filled 2014-03-26 (×3): qty 1

## 2014-03-26 MED ORDER — LATANOPROST 0.005 % OP SOLN
1.0000 [drp] | Freq: Every day | OPHTHALMIC | Status: DC
  Administered 2014-03-26: 1 [drp] via OPHTHALMIC
  Filled 2014-03-26: qty 2.5

## 2014-03-26 MED ORDER — METOPROLOL TARTRATE 12.5 MG HALF TABLET
12.5000 mg | ORAL_TABLET | Freq: Two times a day (BID) | ORAL | Status: DC
  Administered 2014-03-26 – 2014-03-27 (×2): 12.5 mg via ORAL
  Filled 2014-03-26 (×3): qty 1

## 2014-03-26 MED ORDER — INSULIN DETEMIR 100 UNIT/ML ~~LOC~~ SOLN
12.0000 [IU] | Freq: Every day | SUBCUTANEOUS | Status: DC
  Administered 2014-03-26: 12 [IU] via SUBCUTANEOUS
  Filled 2014-03-26 (×3): qty 0.12

## 2014-03-26 MED ORDER — ONDANSETRON HCL 4 MG/2ML IJ SOLN: 4.0000 mg | Freq: Four times a day (QID) | INTRAMUSCULAR | Status: DC | PRN

## 2014-03-26 MED ORDER — CLONIDINE HCL 0.1 MG PO TABS
0.1000 mg | ORAL_TABLET | Freq: Every day | ORAL | Status: DC
  Administered 2014-03-26: 0.1 mg via ORAL
  Filled 2014-03-26 (×2): qty 1

## 2014-03-26 MED ORDER — ACETAMINOPHEN 325 MG PO TABS: 650.0000 mg | ORAL_TABLET | ORAL | Status: DC | PRN

## 2014-03-26 MED ORDER — APIXABAN 5 MG PO TABS
5.0000 mg | ORAL_TABLET | Freq: Two times a day (BID) | ORAL | Status: DC
  Administered 2014-03-26 – 2014-03-27 (×2): 5 mg via ORAL
  Filled 2014-03-26 (×3): qty 1

## 2014-03-26 MED ORDER — LOSARTAN POTASSIUM-HCTZ 50-12.5 MG PO TABS: 1.0000 | ORAL_TABLET | Freq: Every day | ORAL | Status: DC

## 2014-03-26 NOTE — ED Notes (Signed)
Per EMS: standing in line at back when vision started to go black, patient sat down.  Per witnesses, patient passed out and was out for several minutes, upon awakening patient was nauseated and was vomiting.  Patient was clammy on ems arrival, 12 lead showed afib (80-100), patient denies hx of afib.  Patient states she feels weak.  Given zofran by ems, patient states she still feels sick though is not actively vomiting.

## 2014-03-26 NOTE — ED Provider Notes (Signed)
CSN: [id number]     Arrival date & time 03/26/14  1333 History   First MD Initiated Contact with Patient 03/26/14 1419     Chief Complaint  Patient presents with  . Loss of Consciousness  . Atrial Fibrillation  . Nausea     (Consider location/radiation/quality/duration/timing/severity/associated sxs/prior Treatment) Patient is a 73 y.o. female presenting with syncope.  Loss of Consciousness Episode history:  Single Most recent episode:  Today Duration: brief. Timing:  Constant Progression:  Resolved Chronicity:  New Context comment:  Standing in line at the bank Witnessed: yes   Relieved by:  Lying down Associated symptoms: diaphoresis, dizziness, nausea, palpitations (last night) and vomiting   Associated symptoms: no chest pain and no shortness of breath     Past Medical History  Diagnosis Date  . DM2 (diabetes mellitus, type 2)     Uncontrolled  . HTN (hypertension)   . Psoriasis   . Psoriatic arthritis   . H/O: hysterectomy   . Depression   . SOB (shortness of breath) on exertion   . Glaucoma   . Hypercholesterolemia   . Obesity    Past Surgical History  Procedure Laterality Date  . Total knee arthroplasty    . Cardiac catheterization     Family History  Problem Relation Age of Onset  . Stroke Mother   . Heart attack Father    History  Substance Use Topics  . Smoking status: Never Smoker   . Smokeless tobacco: Not on file  . Alcohol Use: No   OB History   Grav Para Term Preterm Abortions TAB SAB Ect Mult Living                 Review of Systems  Constitutional: Positive for diaphoresis.  Respiratory: Negative for shortness of breath.   Cardiovascular: Positive for palpitations (last night) and syncope. Negative for chest pain.  Gastrointestinal: Positive for nausea and vomiting.  Neurological: Positive for dizziness.  All other systems reviewed and are negative.     Allergies  Codeine  Home Medications   Prior to Admission medications    Medication Sig Start Date End Date Taking? Authorizing Provider  amLODipine (NORVASC) 10 MG tablet Take 10 mg by mouth daily.  02/24/12  Yes Historical Provider, MD  atorvastatin (LIPITOR) 20 MG tablet Take 20 mg by mouth daily.  02/06/12  Yes Historical Provider, MD  Brimonidine Tartrate-Timolol (COMBIGAN OP) Place 1 drop into both eyes 2 (two) times daily.    Yes Historical Provider, MD  cloNIDine (CATAPRES) 0.1 MG tablet Take 0.1 mg by mouth at bedtime.   Yes Historical Provider, MD  glipiZIDE (GLUCOTROL) 10 MG tablet Take 10 mg by mouth 2 (two) times daily before a meal.   Yes Historical Provider, MD  insulin detemir (LEVEMIR) 100 unit/ml SOLN Inject 12 Units into the skin at bedtime.   Yes Historical Provider, MD  losartan-hydrochlorothiazide (HYZAAR) 50-12.5 MG per tablet Take 1 tablet by mouth daily. 02/13/13  Yes Tanda Rockers, MD  NEXIUM 40 MG capsule Take 40 mg by mouth daily.  01/29/12  Yes Historical Provider, MD  sertraline (ZOLOFT) 100 MG tablet Take 100 mg by mouth daily.  02/24/12  Yes Historical Provider, MD  sitaGLIPtin (JANUVIA) 100 MG tablet Take 100 mg by mouth daily.   Yes Historical Provider, MD  travoprost, benzalkonium, (TRAVATAN) 0.004 % ophthalmic solution Place 1 drop into both eyes at bedtime.   Yes Historical Provider, MD   BP 124/84  Pulse 84  Temp(Src) 97.6 F (36.4 C) (Oral)  Resp 19  Ht 4\' 11"  (1.499 m)  Wt 166 lb (75.297 kg)  BMI 33.51 kg/m2  SpO2 98% Physical Exam  Nursing note and vitals reviewed. Constitutional: She is oriented to person, place, and time. She appears well-developed and well-nourished. No distress.  HENT:  Head: Normocephalic and atraumatic.  Mouth/Throat: Oropharynx is clear and moist.  Eyes: Conjunctivae are normal. Pupils are equal, round, and reactive to light. No scleral icterus.  Neck: Neck supple.  Cardiovascular: Normal rate, normal heart sounds and intact distal pulses.  An irregularly irregular rhythm present.  No murmur  heard. Pulmonary/Chest: Effort normal and breath sounds normal. No stridor. No respiratory distress. She has no rales.  Abdominal: Soft. Bowel sounds are normal. She exhibits no distension. There is no tenderness.  Musculoskeletal: Normal range of motion.  Neurological: She is alert and oriented to person, place, and time.  Skin: Skin is warm and dry. No rash noted.  Psychiatric: She has a normal mood and affect. Her behavior is normal.    ED Course  Procedures (including critical care time) Labs Review Labs Reviewed  CBC - Abnormal; Notable for the following:    RBC 5.17 (*)    All other components within normal limits  BASIC METABOLIC PANEL - Abnormal; Notable for the following:    Glucose, Bld 258 (*)    GFR calc non Af Amer 67 (*)    GFR calc Af Amer 78 (*)    All other components within normal limits  URINALYSIS, ROUTINE W REFLEX MICROSCOPIC - Abnormal; Notable for the following:    Glucose, UA 250 (*)    Ketones, ur 15 (*)    All other components within normal limits  CBG MONITORING, ED - Abnormal; Notable for the following:    Glucose-Capillary 285 (*)    All other components within normal limits  I-STAT TROPOININ, ED    Imaging Review No results found.   EKG Interpretation   Date/Time:  Friday March 26 2014 14:00:12 EDT Ventricular Rate:  100 PR Interval:    QRS Duration: 80 QT Interval:  384 QTC Calculation: 495 R Axis:   -40 Text Interpretation:  Atrial fibrillation Left anterior fascicular block  Low voltage, precordial leads Abnormal R-wave progression, early  transition Borderline prolonged QT interval No old tracing to compare  Confirmed by [facility]  MD, TREY ([id number]) on 03/26/2014 5:00:55 PM      MDM   Final diagnoses:  Syncope, unspecified syncope type  Atrial fibrillation, unspecified    73 yo female with syncope while standing in line at the bank.  No apparent traumatic injuries, pt was seated at time of syncope, does not think she fell to the  floor.  She was found to be in new onset a fib.  Rate normal currently.  Labs unremarkable.  Cardiology have been consulted.      Artis Delay, MD 03/26/14 [phone]

## 2014-03-26 NOTE — ED Notes (Signed)
Attempted report 

## 2014-03-26 NOTE — H&P (Signed)
Referring Physician:  ED Physician Primary Physician: Elyn Peers, MD Primary Cardiologist: Dr. Martinique   HPI:  Ms. Trim is a 73 year old female with past medical history significant for hypertension, hyperlipidemia, diabetes and obesity. She had a negative cath in 2009 which showed essentially normal coronary arteries with normal ejection fraction. Her last echocardiogram was obtained on 03/18/2012 which showed EF 09-81%, grade 1 diastolic dysfunction. She was last seen by Dr. Martinique on 04/29/2012, during which visit, Dr. Martinique reviewed the previous cath results and the echo result and felt that her dyspnea on exertion was unlikely to be related to cardiac etiology. She was eventually referred to Dr. Melvyn Novas with pulmonology for further evaluation. After evaluation by pulmonology, it was felt that her dyspnea was related to lisinopril, therefore lisinopril was discontinued and she was started on losartan. She states her dyspnea on exertion improved after that. According to the patient, she had intermittent irregular rhythm once every few months for the past several years. She has not seen a cardiologist for her irregular rhythm before.  She denies any recent chest pain, significant lower extremity edema, orthopnea, proximal nocturnal dyspnea, fever, chill, or cough. She states she has noticed increased dyspnea associated with exertion in the recent few months. This was new since her she stopped her lisinopril years ago. For the past 2 weeks, she has not been feeling well with right lower abdominal discomfort. She sought help with her PCP, and was told it was related to arthritis. Patient has also noted intermittent dizzy spells. She states she had a total of 8 falls in the last year. She describes herself as very clumsy. She also states her physical therapist told her she has some imbalance issue when she walks while turning her head, however no imbalance when she walk straight. She states she has  been compliant with her medication at home.  She went to sleep on the night of 03/25/2014 feeling her heart was out of rhythm with mild palpitation. She went to [employer] in the morning of 03/26/2014. After eating at [facility] inside [place], she went to the in-store bank. Shortly after getting up from [employer] and walk toward the bank, she had dizziness, diaphoresis and feeling clammy. She sat down in a nearby chair and felt she lost her consciousness for several minutes. She regained her consciousness after a bank teller tried to wake her up. When she woke up, she felt nausea and had vomiting. She was subsequently transferred to [facility] for [facility] evaluation. Upon arrival, her blood pressure was 116/78. O2 saturation 100% on room air. She was noted to be tachycardic with heart rate 125. Laboratory finding were significant for creatinine of 0.85, negative troponin, glucose was elevated at 258. EKG showed atrial fibrillation with heart rate 100, poor R wave progression in anterior leads, left anterior fascicular block. She has no prior documented EKG of atrial fibrillation, although patient states she has been having occasional irregular rhythm for the past several years.   Review of Systems:     Cardiac Review of Systems: {Y] = yes [ ]  = no  Chest Pain [  No  ]  Resting SOB [  No ] Exertional SOB  [ yes  ]  Orthopnea [ no ]   Pedal Edema [ nonpitting only  ]    Palpitations [  ] Syncope  [ yes  ]   Presyncope [   ]   General Review of Systems: [Y] = yes [  ]=no Constitional:  recent weight change [ no]; anorexia [  ]; fatigue [ no ]; nausea [ yes ]; night sweats [  ]; fever [ no ]; or chills [no  ];                                                                     Eyes : blurred vision [  ]; diplopia [   ]; vision changes [  ];  Amaurosis fugax[  ];  Resp: cough [ no ];  wheezing[ no ];  hemoptysis[  ];  PND Finlee.Brake  ];  GI:  gallstones[  ], vomiting[ yes ];  dysphagia[  ]; melena[  ];   hematochezia [no  ]; heartburn[  ];  RLQ abdominal pain GU: kidney stones [  ]; hematuria[ no ];   dysuria [  ];  nocturia[  ]; incontinence [ no ];             Skin: rash, swelling[no  ];, hair loss[  ];  peripheral edema[ yes, nonpitting ];  or itching[  ]; Musculosketetal: myalgias[  ];  joint swelling[  ];  joint erythema[  ];  joint pain[  ];  back pain[no  ];  Heme/Lymph: bruising[  ];  bleeding[no  ];  anemia[ no ];  Neuro: TIA[  ];  headaches[no  ];  stroke[  ];  vertigo[  ];  seizures[  ];   paresthesias[  ];  difficulty walking[ multiple fall ];  Psych:depression[  ]; anxiety[  ];  Endocrine: diabetes[yes  ];  thyroid dysfunction[no  ];  Other:  Past Medical History  Diagnosis Date  . DM2 (diabetes mellitus, type 2)     Uncontrolled  . HTN (hypertension)   . Psoriasis   . Psoriatic arthritis   . H/O: hysterectomy   . Depression   . SOB (shortness of breath) on exertion   . Glaucoma   . Hypercholesterolemia   . Obesity     Infusions:   Allergies  Allergen Reactions  . Codeine Itching    History   Social History  . Marital Status: Legally Separated    Spouse Name: N/A    Number of Children: 2  . Years of Education: N/A   Occupational History  . office work   . CNA    Social History Main Topics  . Smoking status: Never Smoker   . Smokeless tobacco: Not on file  . Alcohol Use: No  . Drug Use: No  . Sexual Activity:    Other Topics Concern  . Not on file   Social History Narrative  . No narrative on file    Family History  Problem Relation Age of Onset  . Stroke Mother   . Heart attack Father     PHYSICAL EXAM: Filed Vitals:   03/26/14 1630  BP: 165/90  Pulse: 97  Temp:   Resp: 19    No intake or output data in the 24 hours ending 03/26/14 1649  General:  Well appearing. No respiratory difficulty HEENT: normal Neck: supple. no JVD. Carotids 2+ bilat; no bruits. No lymphadenopathy or thryomegaly appreciated. Cor: PMI nondisplaced.  Irregularly irregular No rubs, gallops or murmurs. Lungs: clear Abdomen: soft, nontender, nondistended. No hepatosplenomegaly. No bruits or masses. Good bowel  sounds. Extremities: no cyanosis, clubbing, rash. LE nonpitting edema Neuro: alert & oriented x 3, cranial nerves grossly intact. moves all 4 extremities w/o difficulty. Affect pleasant.  ECG:  Results for orders placed during the hospital encounter of 03/26/14 (from the past 24 hour(s))  CBC     Status: Abnormal   Collection Time    03/26/14  2:10 PM      Result Value Ref Range   WBC 10.3  4.0 - 10.5 K/uL   RBC 5.17 (*) 3.87 - 5.11 MIL/uL   Hemoglobin 14.7  12.0 - 15.0 g/dL   HCT 42.5  36.0 - 46.0 %   MCV 82.2  78.0 - 100.0 fL   MCH 28.4  26.0 - 34.0 pg   MCHC 34.6  30.0 - 36.0 g/dL   RDW 13.1  11.5 - 15.5 %   Platelets 310  150 - 400 K/uL  BASIC METABOLIC PANEL     Status: Abnormal   Collection Time    03/26/14  2:10 PM      Result Value Ref Range   Sodium 140  137 - 147 mEq/L   Potassium 3.9  3.7 - 5.3 mEq/L   Chloride 99  96 - 112 mEq/L   CO2 26  19 - 32 mEq/L   Glucose, Bld 258 (*) 70 - 99 mg/dL   BUN 15  6 - 23 mg/dL   Creatinine, Ser 0.85  0.50 - 1.10 mg/dL   Calcium 9.5  8.4 - 10.5 mg/dL   GFR calc non Af Amer 67 (*) >90 mL/min   GFR calc Af Amer 78 (*) >90 mL/min   Anion gap 15  5 - 15  I-STAT TROPOININ, ED     Status: None   Collection Time    03/26/14  2:25 PM      Result Value Ref Range   Troponin i, poc 0.00  0.00 - 0.08 ng/mL   Comment 3           CBG MONITORING, ED     Status: Abnormal   Collection Time    03/26/14  3:11 PM      Result Value Ref Range   Glucose-Capillary 285 (*) 70 - 99 mg/dL   No results found.   ASSESSMENT: 1. Syncope 2. atrial fibrillation of unknown duration 3. Hypertension 4. Hyperlipidemia 5. Diabetes 6. Obesity  PLAN/DISCUSSION: 1. Admit to telemetry, trend enzyme (clean coronary on cath in 2009 reassuring, unlikely to have ACS, denies CP, admit to have  palpitation)  2. D/C amlodipine, add metoprolol 12.5 BID (may not need IV dilt as her HR 80 -110s on no rate control med)  3. Check TSH, orthostatic pressure, repeat Echo (to assess LV function and LA size)  4. ?if would be a systemic anticoagulation candidate, 8 falls in last yr, however appears to be mostly mechanical falls. IV heparin for now  5. Discussed with patient regarding potential cardioversion, she is hesitant as her mother got shocked multiple times. If does not convert with rate control, consider maintain rate control for now, f/u with Dr. Martinique and readdress cardioversion after 3 wks. May be candidate for NOAC depend on bleeding risk with fall  6. Watch out for severe bradycardia, per pt, her baseline HR should be in 21s   Signed, Almyra Deforest PA  Patient seen and examined with Almyra Deforest, PA-C. We discussed all aspects of the encounter. I agree with the assessment and plan as stated above.  73 y/o woman as above  presents with newly diagnosed AF with RVR and associated syncope. (CHADS2 = 2, CHADSVASC = 4 -> stroke risk 4.0% per year). She has had multiple falls but is very concerned about having a stroke because her mother had AF and suffered a stroke. We discussed the risks and indications of anti-coagulation with particular attention to her recurrent falls. She feels like she can tolerate anti-coagulation better if she uses a walker.   Agree with admitting her for rate control. Start low-dose metoprolol being watchful for tachy-brady syndrome. My gut feeling is that she will not tolerate AF well and will likely require an anti-arrhythmic agent at some point but we will see how she does.   Plan:  1) Admit tele 2) Start low-dose metoprolol 3) Apixaban 5 bid 4) Check echo  5) PT/OT consult  Benay Spice 6:34 PM

## 2014-03-27 ENCOUNTER — Encounter (HOSPITAL_COMMUNITY): Payer: Self-pay | Admitting: *Deleted

## 2014-03-27 DIAGNOSIS — I4891 Unspecified atrial fibrillation: Secondary | ICD-10-CM

## 2014-03-27 DIAGNOSIS — R55 Syncope and collapse: Secondary | ICD-10-CM | POA: Diagnosis present

## 2014-03-27 DIAGNOSIS — I10 Essential (primary) hypertension: Secondary | ICD-10-CM

## 2014-03-27 DIAGNOSIS — I480 Paroxysmal atrial fibrillation: Secondary | ICD-10-CM

## 2014-03-27 HISTORY — DX: Syncope and collapse: R55

## 2014-03-27 LAB — GLUCOSE, CAPILLARY
Glucose-Capillary: 113 mg/dL — ABNORMAL HIGH (ref 70–99)
Glucose-Capillary: 130 mg/dL — ABNORMAL HIGH (ref 70–99)
Glucose-Capillary: 114 mg/dL — ABNORMAL HIGH (ref 70–99)

## 2014-03-27 LAB — LIPID PANEL
Cholesterol: 152 mg/dL (ref 0–200)
HDL: 76 mg/dL (ref >39)
LDL Cholesterol: 60 mg/dL (ref 0–99)
Total CHOL/HDL Ratio: 2 RATIO
Triglycerides: 78 mg/dL (ref <150)
VLDL: 16 mg/dL (ref 0–40)

## 2014-03-27 LAB — BASIC METABOLIC PANEL
Anion gap: 10 (ref 5–15)
BUN: 19 mg/dL (ref 6–23)
CO2: 28 mEq/L (ref 19–32)
Calcium: 9.5 mg/dL (ref 8.4–10.5)
Chloride: 99 mEq/L (ref 96–112)
Creatinine, Ser: 0.82 mg/dL (ref 0.50–1.10)
GFR calc Af Amer: 81 mL/min — ABNORMAL LOW (ref >90)
GFR calc non Af Amer: 70 mL/min — ABNORMAL LOW (ref >90)
Glucose, Bld: 125 mg/dL — ABNORMAL HIGH (ref 70–99)
Potassium: 5.8 mEq/L — ABNORMAL HIGH (ref 3.7–5.3)
Sodium: 137 mEq/L (ref 137–147)

## 2014-03-27 LAB — TROPONIN I
Troponin I: <0.3 ng/mL (ref <0.30)
Troponin I: <0.3 ng/mL (ref <0.30)

## 2014-03-27 LAB — HEMOGLOBIN A1C
Hgb A1c MFr Bld: 7.4 % — ABNORMAL HIGH (ref <5.7)
Mean Plasma Glucose: 166 mg/dL — ABNORMAL HIGH (ref <117)

## 2014-03-27 MED ORDER — ACETAMINOPHEN 325 MG PO TABS: 650.0000 mg | ORAL_TABLET | ORAL | Status: AC | PRN

## 2014-03-27 MED ORDER — FUROSEMIDE 20 MG PO TABS: 20.0000 mg | ORAL_TABLET | Freq: Every day | ORAL | Status: DC

## 2014-03-27 MED ORDER — METOPROLOL TARTRATE 12.5 MG HALF TABLET: 12.5000 mg | ORAL_TABLET | Freq: Two times a day (BID) | ORAL | Status: DC

## 2014-03-27 MED ORDER — APIXABAN 5 MG PO TABS: 5.0000 mg | ORAL_TABLET | Freq: Two times a day (BID) | ORAL | Status: DC

## 2014-03-27 NOTE — Discharge Instructions (Addendum)
Information on my medicine - ELIQUIS (apixaban)  This medication education was reviewed with me or my healthcare representative as part of my discharge preparation.  The pharmacist that spoke with me during my hospital stay was:  Horatio Pel, RPH  Why was Eliquis prescribed for you? Eliquis was prescribed for you to reduce the risk of a blood clot forming that can cause a stroke if you have a medical condition called atrial fibrillation (a type of irregular heartbeat).  What do You need to know about Eliquis ? Take your Eliquis TWICE DAILY - one tablet in the morning and one tablet in the evening with or without food. If you have difficulty swallowing the tablet whole please discuss with your pharmacist how to take the medication safely.  Take Eliquis exactly as prescribed by your doctor and DO NOT stop taking Eliquis without talking to the doctor who prescribed the medication.  Stopping may increase your risk of developing a stroke.  Refill your prescription before you run out.  After discharge, you should have regular check-up appointments with your healthcare provider that is prescribing your Eliquis.  In the future your dose may need to be changed if your kidney function or weight changes by a significant amount or as you get older.  What do you do if you miss a dose? If you miss a dose, take it as soon as you remember on the same day and resume taking twice daily.  Do not take more than one dose of ELIQUIS at the same time to make up a missed dose.  Important Safety Information A possible side effect of Eliquis is bleeding. You should call your healthcare provider right away if you experience any of the following:   Bleeding from an injury or your nose that does not stop.   Unusual colored urine (red or dark brown) or unusual colored stools (red or black).   Unusual bruising for unknown reasons.   A serious fall or if you hit your head (even if there is no bleeding).  Some  medicines may interact with Eliquis and might increase your risk of bleeding or clotting while on Eliquis. To help avoid this, consult your healthcare provider or pharmacist prior to using any new prescription or non-prescription medications, including herbals, vitamins, non-steroidal anti-inflammatory drugs (NSAIDs) and supplements.  This website has more information on Eliquis (apixaban): [email or web address]  Atrial Fibrillation Atrial fibrillation is a type of irregular heart rhythm (arrhythmia). During atrial fibrillation, the upper chambers of the heart (atria) quiver continuously in a chaotic pattern. This causes an irregular and often rapid heart rate.  Atrial fibrillation is the result of the heart becoming overloaded with disorganized signals that tell it to beat. These signals are normally released one at a time by a part of the right atrium called the sinoatrial node. They then travel from the atria to the lower chambers of the heart (ventricles), causing the atria and ventricles to contract and pump blood as they pass. In atrial fibrillation, parts of the atria outside of the sinoatrial node also release these signals. This results in two problems. First, the atria receive so many signals that they do not have time to fully contract. Second, the ventricles, which can only receive one signal at a time, beat irregularly and out of rhythm with the atria.  There are three types of atrial fibrillation:   Paroxysmal. Paroxysmal atrial fibrillation starts suddenly and stops on its own within a week.  Persistent. Persistent atrial fibrillation lasts  for more than a week. It may stop on its own or with treatment.  Permanent. Permanent atrial fibrillation does not go away. Episodes of atrial fibrillation may lead to permanent atrial fibrillation. Atrial fibrillation can prevent your heart from pumping blood normally. It increases your risk of stroke and can lead to heart failure.    CAUSES   Heart conditions, including a heart attack, heart failure, coronary artery disease, and heart valve conditions.   Inflammation of the sac that surrounds the heart (pericarditis).  Blockage of an artery in the lungs (pulmonary embolism).  Pneumonia or other infections.  Chronic lung disease.  Thyroid problems, especially if the thyroid is overactive (hyperthyroidism).  Caffeine, excessive alcohol use, and use of some illegal drugs.   Use of some medicines, including certain decongestants and diet pills.  Heart surgery.   Birth defects.  Sometimes, no cause can be found. When this happens, the atrial fibrillation is called lone atrial fibrillation. The risk of complications from atrial fibrillation increases if you have lone atrial fibrillation and you are age 2 years or older. RISK FACTORS  Heart failure.  Coronary artery disease.  Diabetes mellitus.   High blood pressure (hypertension).   Obesity.   Other arrhythmias.   Increased age. SIGNS AND SYMPTOMS   A feeling that your heart is beating rapidly or irregularly.   A feeling of discomfort or pain in your chest.   Shortness of breath.   Sudden light-headedness or weakness.   Getting tired easily when exercising.   Urinating more often than normal (mainly when atrial fibrillation first begins).  In paroxysmal atrial fibrillation, symptoms may start and suddenly stop. DIAGNOSIS  Your health care provider may be able to detect atrial fibrillation when taking your pulse. Your health care provider may have you take a test called an ambulatory electrocardiogram (ECG). An ECG records your heartbeat patterns over a 24-hour period. You may also have other tests, such as:  Transthoracic echocardiogram (TTE). During echocardiography, sound waves are used to evaluate how blood flows through your heart.  Transesophageal echocardiogram (TEE).  Stress test. There is more than one type of stress  test. If a stress test is needed, ask your health care provider about which type is best for you.  Chest X-ray exam.  Blood tests.  Computed tomography (CT). TREATMENT  Treatment may include:  Treating any underlying conditions. For example, if you have an overactive thyroid, treating the condition may correct atrial fibrillation.  Taking medicine. Medicines may be given to control a rapid heart rate or to prevent blood clots, heart failure, or a stroke.  Having a procedure to correct the rhythm of the heart:  Electrical cardioversion. During electrical cardioversion, a controlled, low-energy shock is delivered to the heart through your skin. If you have chest pain, very low blood pressure, or sudden heart failure, this procedure may need to be done as an emergency.  Catheter ablation. During this procedure, heart tissues that send the signals that cause atrial fibrillation are destroyed.  Surgical ablation. During this surgery, thin lines of heart tissue that carry the abnormal signals are destroyed. This procedure can either be an open-heart surgery or a minimally invasive surgery. With the minimally invasive surgery, small cuts are made to access the heart instead of a large opening.  Pulmonary venous isolation. During this surgery, tissue around the veins that carry blood from the lungs (pulmonary veins) is destroyed. This tissue is thought to carry the abnormal signals. HOME CARE INSTRUCTIONS   Take  medicines only as directed by your health care provider. Some medicines can make atrial fibrillation worse or recur.  If blood thinners were prescribed by your health care provider, take them exactly as directed. Too much blood-thinning medicine can cause bleeding. If you take too little, you will not have the needed protection against stroke and other problems.  Perform blood tests at home if directed by your health care provider. Perform blood tests exactly as directed.  Quit smoking  if you smoke.  Do not drink alcohol.  Do not drink caffeinated beverages such as coffee, soda, and some teas. You may drink decaffeinated coffee, soda, or tea.   Maintain a healthy weight.Do not use diet pills unless your health care provider approves. They may make heart problems worse.   Follow diet instructions as directed by your health care provider.  Exercise regularly as directed by your health care provider.  Keep all follow-up visits as directed by your health care provider. This is important. PREVENTION  The following substances can cause atrial fibrillation to recur:   Caffeinated beverages.  Alcohol.  Certain medicines, especially those used for breathing problems.  Certain herbs and herbal medicines, such as those containing ephedra or ginseng.  Illegal drugs, such as cocaine and amphetamines. Sometimes medicines are given to prevent atrial fibrillation from recurring. Proper treatment of any underlying condition is also important in helping prevent recurrence.  SEEK MEDICAL CARE IF:  You notice a change in the rate, rhythm, or strength of your heartbeat.  You suddenly begin urinating more frequently.  You tire more easily when exerting yourself or exercising. SEEK IMMEDIATE MEDICAL CARE IF:   You have chest pain, abdominal pain, sweating, or weakness.  You feel nauseous.  You have shortness of breath.  You suddenly have swollen feet and ankles.  You feel dizzy.  Your face or limbs feel numb or weak.  You have a change in your vision or speech. MAKE SURE YOU:   Understand these instructions.  Will watch your condition.  Will get help right away if you are not doing well or get worse. Document Released: 06/11/2005 Document Revised: 10/26/2013 Document Reviewed: 07/22/2012 [facility] Patient Information 2015 [place], [place]. This information is not intended to replace advice given to you by your health care provider. Make sure you discuss any  questions you have with your health care provider.  Cardiac Diet This diet can help prevent heart disease and stroke. Many factors influence your heart health, including eating and exercise habits. Coronary risk rises a lot with abnormal blood fat (lipid) levels. Cardiac meal planning includes limiting unhealthy fats, increasing healthy fats, and making other small dietary changes. General guidelines are as follows:  Adjust calorie intake to reach and maintain desirable body weight.  Limit total fat intake to less than 30% of total calories. Saturated fat should be less than 7% of calories.  Saturated fats are found in animal products and in some vegetable products. Saturated vegetable fats are found in coconut oil, cocoa butter, palm oil, and palm kernel oil. Read labels carefully to avoid these products as much as possible. Use butter in moderation. Choose tub margarines and oils that have 2 grams of fat or less. Good cooking oils are canola and olive oils.  Practice low-fat cooking techniques. Do not fry food. Instead, broil, bake, boil, steam, grill, roast on a rack, stir-fry, or microwave it. Other fat reducing suggestions include:  Remove the skin from poultry.  Remove all visible fat from meats.  Skim  the fat off stews, soups, and gravies before serving them.  Steam vegetables in water or broth instead of sauting them in fat.  Avoid foods with trans fat (or hydrogenated oils), such as commercially fried foods and commercially baked goods. Commercial shortening and deep-frying fats will contain trans fat.  Increase intake of fruits, vegetables, whole grains, and legumes to replace foods high in fat.  Increase consumption of nuts, legumes, and seeds to at least 4 servings weekly. One serving of a legume equals  cup, and 1 serving of nuts or seeds equals  cup.  Choose whole grains more often. Have 3 servings per day (a serving is 1 ounce [oz]).  Eat 4 to 5 servings of vegetables  per day. A serving of vegetables is 1 cup of raw leafy vegetables;  cup of raw or cooked cut-up vegetables;  cup of vegetable juice.  Eat 4 to 5 servings of fruit per day. A serving of fruit is 1 medium whole fruit;  cup of dried fruit;  cup of fresh, frozen, or canned fruit;  cup of 100% fruit juice.  Increase your intake of dietary fiber to 20 to 30 grams per day. Insoluble fiber may help lower your risk of heart disease and may help curb your appetite.  Soluble fiber binds cholesterol to be removed from the blood. Foods high in soluble fiber are dried beans, citrus fruits, oats, apples, bananas, broccoli, Brussels sprouts, and eggplant.  Try to include foods fortified with plant sterols or stanols, such as yogurt, breads, juices, or margarines. Choose several fortified foods to achieve a daily intake of 2 to 3 grams of plant sterols or stanols.  Foods with omega-3 fats can help reduce your risk of heart disease. Aim to have a 3.5 oz portion of fatty fish twice per week, such as salmon, mackerel, albacore tuna, sardines, lake trout, or herring. If you wish to take a fish oil supplement, choose one that contains 1 gram of both DHA and EPA.  Limit processed meats to 2 servings (3 oz portion) weekly.  Limit the sodium in your diet to 1500 milligrams (mg) per day. If you have high blood pressure, talk to a registered dietitian about a DASH (Dietary Approaches to Stop Hypertension) eating plan.  Limit sweets and beverages with added sugar, such as soda, to no more than 5 servings per week. One serving is:   1 tablespoon sugar.  1 tablespoon jelly or jam.   cup sorbet.  1 cup lemonade.   cup regular soda. CHOOSING FOODS Starches  Allowed: Breads: All kinds (wheat, rye, raisin, white, oatmeal, [place], [place], and English muffin bread). Low-fat rolls: English muffins, frankfurter and hamburger buns, bagels, pita bread, tortillas (not fried). Pancakes, waffles, biscuits, and muffins  made with recommended oil.  Avoid: Products made with saturated or trans fats, oils, or whole milk products. Butter rolls, cheese breads, croissants. Commercial doughnuts, muffins, sweet rolls, biscuits, waffles, pancakes, store-bought mixes. Crackers  Allowed: Low-fat crackers and snacks: Animal, graham, rye, saltine (with recommended oil, no lard), oyster, and matzo crackers. Bread sticks, melba toast, rusks, flatbread, pretzels, and light popcorn.  Avoid: High-fat crackers: cheese crackers, butter crackers, and those made with coconut, palm oil, or trans fat (hydrogenated oils). Buttered popcorn. Cereals  Allowed: Hot or cold whole-grain cereals.  Avoid: Cereals containing coconut, hydrogenated vegetable fat, or animal fat. Potatoes / Pasta / Rice  Allowed: All kinds of potatoes, rice, and pasta (such as macaroni, spaghetti, and noodles).  Avoid: Pasta or rice  prepared with cream sauce or high-fat cheese. Chow mein noodles, [place] fries. Vegetables  Allowed: All vegetables and vegetable juices.  Avoid: Fried vegetables. Vegetables in cream, butter, or high-fat cheese sauces. Limit coconut. Fruit in cream or custard. Protein  Allowed: Limit your intake of meat, seafood, and poultry to no more than 6 oz (cooked weight) per day. All lean, well-trimmed beef, veal, pork, and lamb. All chicken and [place] without skin. All fish and shellfish. Wild game: wild duck, rabbit, pheasant, and venison. Egg whites or low-cholesterol egg substitutes may be used as desired. Meatless dishes: recipes with dried beans, peas, lentils, and tofu (soybean curd). Seeds and nuts: all seeds and most nuts.  Avoid: Prime grade and other heavily marbled and fatty meats, such as short ribs, spare ribs, rib eye roast or steak, frankfurters, sausage, bacon, and high-fat luncheon meats, mutton. Caviar. Commercially fried fish. Domestic duck, goose, venison sausage. Organ meats: liver, gizzard, heart, chitterlings,  brains, kidney, sweetbreads. Dairy  Allowed: Low-fat cheeses: nonfat or low-fat cottage cheese (1% or 2% fat), cheeses made with part skim milk, such as mozzarella, farmers, string, or ricotta. (Cheeses should be labeled no more than 2 to 6 grams fat per oz.). Skim (or 1%) milk: liquid, powdered, or evaporated. Buttermilk made with low-fat milk. Drinks made with skim or low-fat milk or cocoa. Chocolate milk or cocoa made with skim or low-fat (1%) milk. Nonfat or low-fat yogurt.  Avoid: Whole milk cheeses, including colby, cheddar, [place], Monterey Jack, [place], [place], [place], American, Swiss, and blue. Creamed cottage cheese, cream cheese. Whole milk and whole milk products, including buttermilk or yogurt made from whole milk, drinks made from whole milk. Condensed milk, evaporated whole milk, and 2% milk. Soups and Combination Foods  Allowed: Low-fat low-sodium soups: broth, dehydrated soups, homemade broth, soups with the fat removed, homemade cream soups made with skim or low-fat milk. Low-fat spaghetti, lasagna, chili, and Spanish rice if low-fat ingredients and low-fat cooking techniques are used.  Avoid: Cream soups made with whole milk, cream, or high-fat cheese. All other soups. Desserts and Sweets  Allowed: Sherbet, fruit ices, gelatins, meringues, and angel food cake. Homemade desserts with recommended fats, oils, and milk products. Jam, jelly, honey, marmalade, sugars, and syrups. Pure sugar candy, such as gum drops, hard candy, jelly beans, marshmallows, mints, and small amounts of dark chocolate.  Avoid: Commercially prepared cakes, pies, cookies, frosting, pudding, or mixes for these products. Desserts containing whole milk products, chocolate, coconut, lard, palm oil, or palm kernel oil. Ice cream or ice cream drinks. Candy that contains chocolate, coconut, butter, hydrogenated fat, or unknown ingredients. Buttered syrups. Fats and Oils  Allowed: Vegetable oils: safflower,  sunflower, corn, soybean, cottonseed, sesame, canola, olive, or peanut. Non-hydrogenated margarines. Salad dressing or mayonnaise: homemade or commercial, made with a recommended oil. Low or nonfat salad dressing or mayonnaise.  Limit added fats and oils to 6 to 8 tsp per day (includes fats used in cooking, baking, salads, and spreads on bread). Remember to count the "hidden fats" in foods.  Avoid: Solid fats and shortenings: butter, lard, salt pork, bacon drippings. Gravy containing meat fat, shortening, or suet. Cocoa butter, coconut. Coconut oil, palm oil, palm kernel oil, or hydrogenated oils: these ingredients are often used in bakery products, nondairy creamers, whipped toppings, candy, and commercially fried foods. Read labels carefully. Salad dressings made of unknown oils, sour cream, or cheese, such as blue cheese and [place]. Cream, all kinds: half-and-half, light, heavy, or whipping. Sour cream or cream  cheese (even if "light" or low-fat). Nondairy cream substitutes: coffee creamers and sour cream substitutes made with palm, palm kernel, hydrogenated oils, or coconut oil. Beverages  Allowed: Coffee (regular or decaffeinated), tea. Diet carbonated beverages, mineral water. Alcohol: Check with your caregiver. Moderation is recommended.  Avoid: Whole milk, regular sodas, and juice drinks with added sugar. Condiments  Allowed: All seasonings and condiments. Cocoa powder. "Cream" sauces made with recommended ingredients.  Avoid: Carob powder made with hydrogenated fats. SAMPLE MENU Breakfast   cup orange juice   cup oatmeal  1 slice toast  1 tsp margarine  1 cup skim milk Lunch  [place] sandwich with 2 oz [place], 2 slices bread  Lettuce and tomato slices  Fresh fruit  Carrot sticks  Coffee or tea Snack  Fresh fruit or low-fat crackers Dinner  3 oz lean ground beef  1 baked potato  1 tsp margarine   cup asparagus  Lettuce salad  1 tbs non-creamy  dressing   cup peach slices  1 cup skim milk Document Released: 03/20/2008 Document Revised: 12/11/2011 Document Reviewed: 08/11/2013 [employer] Patient Information 2015 [place], [place]. This information is not intended to replace advice given to you by your health care provider. Make sure you discuss any questions you have with your health care provider.

## 2014-03-27 NOTE — Progress Notes (Signed)
  Echocardiogram 2D Echocardiogram has been performed.  Milaina Sher 03/27/2014, 11:45 AM

## 2014-03-27 NOTE — Discharge Summary (Signed)
Patient ID: Christina Bishop,  MRN: [id number], DOB/AGE: October 02, 1940 73 y.o.  Admit date: 03/26/2014 Discharge date: 03/27/2014  Primary Care Provider: Elyn Peers, MD Primary Cardiologist: Dr Martinique  Discharge Diagnoses Principal Problem:   Syncope Active Problems:   Atrial fibrillation   DM2 (diabetes mellitus, type 2)   HTN (hypertension)   Hypercholesterolemia   GERD (gastroesophageal reflux disease)    Hospital Course:  73 year old female with past medical history significant for hypertension, hyperlipidemia, diabetes and obesity. She had a negative cath in 2009 which showed essentially normal coronary arteries with normal ejection fraction. Her last echocardiogram was obtained on 03/18/2012 which showed EF 12-24%, grade 1 diastolic dysfunction. She was last seen by Dr. Martinique on 04/29/2012, during which visit, Dr. Martinique reviewed the previous cath results and the echo result and felt that her dyspnea on exertion was unlikely to be related to cardiac etiology. She was eventually referred to Dr. Melvyn Novas with pulmonology for further evaluation. After evaluation by pulmonology, it was felt that her dyspnea was related to lisinopril, therefore lisinopril was discontinued and she was started on losartan. She states her dyspnea on exertion improved after that. According to the patient, she had intermittent irregular rhythm once every few months for the past several years. She has not seen a cardiologist for her irregular rhythm before.              She went to sleep on the night of 03/25/2014 feeling her heart was out of rhythm with mild palpitation. She went to [employer] in the morning of 03/26/2014. After eating at [facility] inside [place], she went to the in-store bank. Shortly after getting up from [employer] and walk toward the bank, she had dizziness, diaphoresis and feeling clammy. She sat down in a nearby chair and felt she lost her consciousness for several minutes. She regained her  consciousness after a bank teller tried to wake her up. When she woke up, she felt nausea and had vomiting. She was subsequently transferred to [facility] for further evaluation. Upon arrival, her blood pressure was 116/78. O2 saturation 100% on room air. She was noted to be tachycardic with heart rate 125. Laboratory finding were significant for creatinine of 0.85, negative troponin, glucose was elevated at 258. EKG showed atrial fibrillation with heart rate 100, poor R wave progression in anterior leads, left anterior fascicular block. She has no prior documented EKG of atrial fibrillation, although patient states she has been having occasional irregular rhythm for the past several years.                She was admitted and low dose Metoprolol was added for rate control. Eliquis was added. She declined DCCV stating her mother had some problems after a cardioversion. Echo shows preserved LVF with normal LA size. Her K+ was 5.8 at discharge but 3.9 on admission and I suspect her high K+ is secondary to hemolysis since her medications are pretty much the same. This should be re checked as an OP.    Discharge Vitals:  Blood pressure 106/56, pulse 74, temperature 97.8 F (36.6 C), temperature source Oral, resp. rate 16, height 4\' 11"  (1.499 m), weight 166 lb (75.297 kg), SpO2 96.00%.    Labs: Results for orders placed during the hospital encounter of 03/26/14 (from the past 24 hour(s))  CBG MONITORING, ED     Status: Abnormal   Collection Time    03/26/14  3:11 PM      Result Value  Ref Range   Glucose-Capillary 285 (*) 70 - 99 mg/dL  URINALYSIS, ROUTINE W REFLEX MICROSCOPIC     Status: Abnormal   Collection Time    03/26/14  4:27 PM      Result Value Ref Range   Color, Urine YELLOW  YELLOW   APPearance CLEAR  CLEAR   Specific Gravity, Urine 1.024  1.005 - 1.030   pH 6.5  5.0 - 8.0   Glucose, UA 250 (*) NEGATIVE mg/dL   Hgb urine dipstick NEGATIVE  NEGATIVE   Bilirubin Urine NEGATIVE   NEGATIVE   Ketones, ur 15 (*) NEGATIVE mg/dL   Protein, ur NEGATIVE  NEGATIVE mg/dL   Urobilinogen, UA 0.2  0.0 - 1.0 mg/dL   Nitrite NEGATIVE  NEGATIVE   Leukocytes, UA NEGATIVE  NEGATIVE  GLUCOSE, CAPILLARY     Status: Abnormal   Collection Time    03/26/14  9:25 PM      Result Value Ref Range   Glucose-Capillary 154 (*) 70 - 99 mg/dL  HEMOGLOBIN A1C     Status: Abnormal   Collection Time    03/26/14  9:31 PM      Result Value Ref Range   Hemoglobin A1C 7.4 (*) <5.7 %   Mean Plasma Glucose 166 (*) <117 mg/dL  TSH     Status: None   Collection Time    03/26/14  9:31 PM      Result Value Ref Range   TSH 4.010  0.350 - 4.500 uIU/mL  TROPONIN I     Status: None   Collection Time    03/26/14  9:31 PM      Result Value Ref Range   Troponin I <0.30  <0.30 ng/mL  TROPONIN I     Status: None   Collection Time    03/27/14  2:50 AM      Result Value Ref Range   Troponin I <0.30  <0.30 ng/mL  LIPID PANEL     Status: None   Collection Time    03/27/14  2:50 AM      Result Value Ref Range   Cholesterol 152  0 - 200 mg/dL   Triglycerides 78  <150 mg/dL   HDL 76  >39 mg/dL   Total CHOL/HDL Ratio 2.0     VLDL 16  0 - 40 mg/dL   LDL Cholesterol 60  0 - 99 mg/dL  GLUCOSE, CAPILLARY     Status: Abnormal   Collection Time    03/27/14  7:56 AM      Result Value Ref Range   Glucose-Capillary 113 (*) 70 - 99 mg/dL   Comment 1 Documented in Chart     Comment 2 Notify RN    TROPONIN I     Status: None   Collection Time    03/27/14  8:06 AM      Result Value Ref Range   Troponin I <0.30  <0.30 ng/mL  BASIC METABOLIC PANEL     Status: Abnormal   Collection Time    03/27/14  8:06 AM      Result Value Ref Range   Sodium 137  137 - 147 mEq/L   Potassium 5.8 (*) 3.7 - 5.3 mEq/L   Chloride 99  96 - 112 mEq/L   CO2 28  19 - 32 mEq/L   Glucose, Bld 125 (*) 70 - 99 mg/dL   BUN 19  6 - 23 mg/dL   Creatinine, Ser 0.82  0.50 - 1.10 mg/dL  Calcium 9.5  8.4 - 10.5 mg/dL   GFR calc non Af  Amer 70 (*) >90 mL/min   GFR calc Af Amer 81 (*) >90 mL/min   Anion gap 10  5 - 15  GLUCOSE, CAPILLARY     Status: Abnormal   Collection Time    03/27/14 12:07 PM      Result Value Ref Range   Glucose-Capillary 130 (*) 70 - 99 mg/dL   Comment 1 Documented in Chart     Comment 2 Notify RN      Disposition:      Follow-up Information   Follow up with Peter Martinique, MD.   Specialty:  Cardiology   Contact information:   [address] [place] [address] [phone]       Discharge Medications:    Medication List    STOP taking these medications       amLODipine 10 MG tablet  Commonly known as:  NORVASC      TAKE these medications       acetaminophen 325 MG tablet  Commonly known as:  TYLENOL  Take 2 tablets (650 mg total) by mouth every 4 (four) hours as needed for headache or mild pain.     apixaban 5 MG Tabs tablet  Commonly known as:  ELIQUIS  Take 1 tablet (5 mg total) by mouth 2 (two) times daily.     atorvastatin 20 MG tablet  Commonly known as:  LIPITOR  Take 20 mg by mouth daily.     cloNIDine 0.1 MG tablet  Commonly known as:  CATAPRES  Take 0.1 mg by mouth at bedtime.     COMBIGAN OP  Place 1 drop into both eyes 2 (two) times daily.     glipiZIDE 10 MG tablet  Commonly known as:  GLUCOTROL  Take 10 mg by mouth 2 (two) times daily before a meal.     insulin detemir 100 unit/ml Soln  Commonly known as:  LEVEMIR  Inject 12 Units into the skin at bedtime.     losartan-hydrochlorothiazide 50-12.5 MG per tablet  Commonly known as:  HYZAAR  Take 1 tablet by mouth daily.     metoprolol tartrate 12.5 mg Tabs tablet  Commonly known as:  LOPRESSOR  Take 0.5 tablets (12.5 mg total) by mouth 2 (two) times daily.     NEXIUM 40 MG capsule  Generic drug:  esomeprazole  Take 40 mg by mouth daily.     sertraline 100 MG tablet  Commonly known as:  ZOLOFT  Take 100 mg by mouth daily.     sitaGLIPtin 100 MG tablet  Commonly known as:   JANUVIA  Take 100 mg by mouth daily.     TRAVATAN 0.004 % ophthalmic solution  Generic drug:  travoprost (benzalkonium)  Place 1 drop into both eyes at bedtime.         Duration of Discharge Encounter: Greater than 30 minutes including physician time.  Angelena Form PA-C 03/27/2014 2:58 PM

## 2014-03-27 NOTE — Evaluation (Addendum)
Physical Therapy Evaluation Patient Details Name: Christina Bishop MRN: [id number] DOB: [birth year] Today's Date: 03/27/2014   History of Present Illness  Pt adm with afib with rvr. Pt with multiple falls in past year.  Clinical Impression  Pt did very well using rollator. Pt also could benefit from 3 in 1 for nighttime due to multiple falls have been at night going to bathroom.    Follow Up Recommendations Home health PT    Equipment Recommendations  3in1 (PT); Rollator    Recommendations for Other Services       Precautions / Restrictions Precautions Precautions: Fall      Mobility  Bed Mobility Overal bed mobility: Independent                Transfers Overall transfer level: Independent Equipment used: None;Rolling walker (2 wheeled);4-wheeled walker                Ambulation/Gait Ambulation/Gait assistance: Modified independent (Device/Increase time) Ambulation Distance (Feet): 500 Feet Assistive device: Rolling walker (2 wheeled);4-wheeled walker Gait Pattern/deviations: WFL(Within Functional Limits)   Gait velocity interpretation: at or above normal speed for age/gender General Gait Details: No loss of balance or unsteadiness with walker.  Stairs            Wheelchair Mobility    Modified Rankin (Stroke Patients Only)       Balance Overall balance assessment: History of Falls                                           Pertinent Vitals/Pain Pain Assessment: No/denies pain    Home Living Family/patient expects to be discharged to:: Private residence Living Arrangements: Alone Available Help at Discharge: Family;Available PRN/intermittently Type of Home: Apartment Home Access: Level entry     Home Layout: One level Home Equipment: None      Prior Function Level of Independence: Independent         Comments: At least 8 falls in last year.     Hand Dominance        Extremity/Trunk Assessment   Upper Extremity Assessment: Defer to OT evaluation           Lower Extremity Assessment: Overall WFL for tasks assessed         Communication   Communication: No difficulties  Cognition Arousal/Alertness: Awake/alert Behavior During Therapy: WFL for tasks assessed/performed Overall Cognitive Status: Within Functional Limits for tasks assessed                      General Comments      Exercises        Assessment/Plan    PT Assessment All further PT needs can be met in the next venue of care  PT Diagnosis Difficulty walking   PT Problem List Decreased balance  PT Treatment Interventions     PT Goals (Current goals can be found in the Care Plan section) Acute Rehab PT Goals PT Goal Formulation: No goals set, d/c therapy    Frequency     Barriers to discharge        Co-evaluation               End of Session   Activity Tolerance: Patient tolerated treatment well Patient left: in bed (sitting EOB) Nurse Communication: Mobility status         Time: 5597-4163 PT Time Calculation (  min): 18 min   Charges:   PT Evaluation $Initial PT Evaluation Tier I: 1 Procedure PT Treatments $Gait Training: 8-22 mins   PT G Codes:          Rilla Buckman 04/20/2014, 12:48 PM  [facility] PT [phone]

## 2014-03-27 NOTE — Care Management Note (Addendum)
    Page 1 of 2   03/30/2014     2:51:25 PM CARE MANAGEMENT NOTE 03/30/2014  Patient:  Christina Bishop, Christina Bishop   Account Number:  [id number]  Date Initiated:  03/27/2014  Documentation initiated by:  [facility]  Subjective/Objective Assessment:   adm: Syncope     Action/Plan:   discharge planning   Anticipated DC Date:  03/27/2014   Anticipated DC Plan:  [place]  Patient refused services      Choice offered to / List presented to:     DME arranged  3-N-1  Vassie Moselle      DME agency  [facility].        Status of service:  Completed, signed off Medicare Important Message given?   (If response is "NO", the following Medicare IM given date [place] will be blank) Date Medicare IM given:   Medicare IM given by:   Date Additional Medicare IM given:   Additional Medicare IM given by:    Discharge Disposition:  HOME/SELF CARE  Per UR Regulation:    If discussed at Long Length of Stay Meetings, dates discussed:    Comments:  03/28/15:20 CM spoke with pt on phone to offer choice of home health agency.  Pt states she DOES NOT WANT any HH set up as she has a Dr. Wille Bishop who comes to her house every Monday for PT.  CM asked which agency Dr. Wille Bishop is from and pt states she does not know but she has cleared this with her insurance and does not want to change this arrangements. CM called DME rep to deliver 3n1 and rolling walker to room as pt is ready to leave.  Cm requested RN to please get orders and F2F as they are NOT in the system.  No other CM needs were communicated.  Christina Bishop, BSN, [facility] [phone].    03/30/14- [place] discharge note- Christina Gibbons RN, BSN [phone] received call from pt- regarding [place] that was ordered- pt states that she checked with the agency that she thought she could get her services from and as it turns out they are not in network with her insurance and can not provide the services- she is now  wanting to see if she can still get services with [facility] for HH-PT- order is in epic for HH-PT- informed pt that this CM would make referral to [facility] for [facility] services- referral called to Butch Penny with [facility] for HH-PT and situation explained- [facility] to f/u on referral from weekend discharge.

## 2014-03-27 NOTE — Progress Notes (Signed)
    Subjective:  Feels well. No CP, no SOB.  Objective:  Vital Signs in the last 24 hours: Temp:  [97.6 F (36.4 C)-98.2 F (36.8 C)] 98.2 F (36.8 C) (10/02 2100) Pulse Rate:  [73-125] 106 (10/03 1037) Resp:  [11-24] 16 (10/02 2100) BP: (110-165)/(52-91) 110/78 mmHg (10/03 1037) SpO2:  [93 %-100 %] 99 % (10/02 2100) Weight:  [166 lb (75.297 kg)] 166 lb (75.297 kg) (10/02 1346)  Intake/Output from previous day:     Physical Exam: General: Well developed, well nourished, in no acute distress. Head:  Normocephalic and atraumatic. Lungs: Clear to auscultation and percussion. Heart: Irreg irreg normal rate  No murmur, rubs or gallops.  Abdomen: soft, non-tender, positive bowel sounds. Extremities: No clubbing or cyanosis. No edema. Neurologic: Alert and oriented x 3.    Lab Results:  Recent Labs  03/26/14 1410  WBC 10.3  HGB 14.7  PLT 310    Recent Labs  03/26/14 1410 03/27/14 0806  NA 140 137  K 3.9 5.8*  CL 99 99  CO2 26 28  GLUCOSE 258* 125*  BUN 15 19  CREATININE 0.85 0.82    Recent Labs  03/27/14 0250 03/27/14 0806  TROPONINI <0.30 <0.30    Recent Labs  03/27/14 0250  CHOL 152   No results found for this basename: PROTIME,  in the last 72 hours   Telemetry: 2.1 sec pause while sleeping. AFIB was 140's now 70-80's Personally viewed.    Cardiac Studies:  ECHO from 2013 normal EF. Current read pending.   Assessment/Plan:  Active Problems:   Atrial fibrillation   73 year old with syncope, AFIB, falls.   -OK with DC as long as ECHO not severely abnormal -Continue Eliquis (understands bleeding risks) -May develop tachy brady (understands may need pacer) -She feels much better, wants to go home.  -Syncope may have been autonomic as well.  -She states that she does not want cardioversion. Worried because mother had this and they had trouble restarting her heart she says. I explained that if she does not tolerate AFIB well, this may be an  option for her.  -Follow up with Dr. Martinique or APP in 1-2 weeks.   Alina Gilkey, [place] 03/27/2014, 1:17 PM

## 2014-03-28 NOTE — Discharge Summary (Signed)
Personally seen and examined. Agree with above. See progress note for details. AFIB now rate controlled 2 sec pause on tele. May need pacer in future. Candee Furbish, MD

## 2014-03-30 NOTE — Progress Notes (Signed)
PT Late G code Entry   03/27/14 1253  PT G-Codes **NOT FOR INPATIENT CLASS**  Functional Assessment Tool Used clinical judgement  Functional Limitation Mobility: Walking and moving around  Mobility: Walking and Moving Around Current Status (Z3007) CH  Mobility: Walking and Moving Around Goal Status [phone]) CH  Mobility: Walking and Moving Around Discharge Status [phone]) [place] PT [phone]

## 2014-04-01 ENCOUNTER — Telehealth: Payer: Self-pay | Admitting: Cardiology

## 2014-04-01 NOTE — Telephone Encounter (Signed)
New message   [facility] called reports she is not home bound. Pt doesn't qualify for home health services, she is on her way to lunch [place] [employer] [place].Marland Kitchen Please call if needed

## 2014-04-06 ENCOUNTER — Telehealth: Payer: Self-pay | Admitting: Cardiology

## 2014-04-06 NOTE — Telephone Encounter (Signed)
Pt says she still have not received her Eliquis. She says she need prior authorization. Please call to [facility][phone]

## 2014-04-06 NOTE — Telephone Encounter (Signed)
This message should go to refill-disregard please.

## 2014-04-07 NOTE — Telephone Encounter (Signed)
Per care manager notes from hospital, patient DID NOT want [place] services.Marland Kitchen

## 2014-04-08 MED ORDER — APIXABAN 5 MG PO TABS
5.0000 mg | ORAL_TABLET | Freq: Two times a day (BID) | ORAL | Status: DC
Start: 2014-04-08 — End: 2014-10-04

## 2014-04-08 NOTE — Telephone Encounter (Signed)
[facility] called patient's eliquis approved for 1 year.Prescription sent to pharmacy.

## 2014-04-08 NOTE — Telephone Encounter (Signed)
Please ask Christina Bishop if the PA has been done

## 2014-04-08 NOTE — Telephone Encounter (Signed)
Rose told me to ask you about this message.

## 2014-04-14 ENCOUNTER — Telehealth: Payer: Self-pay | Admitting: Cardiology

## 2014-04-14 NOTE — Telephone Encounter (Signed)
Returned call to patient no answer.Unable to leave a message no answering machine.

## 2014-04-14 NOTE — Telephone Encounter (Signed)
FORWARD TO CHERYL PUGH LPN 

## 2014-04-14 NOTE — Telephone Encounter (Signed)
Patient states that she has received 3 phone calls from this office and does not know why.  Did someone from the clinical area call?

## 2014-04-19 ENCOUNTER — Ambulatory Visit (INDEPENDENT_AMBULATORY_CARE_PROVIDER_SITE_OTHER): Payer: Medicare Other | Admitting: Cardiology

## 2014-04-19 ENCOUNTER — Encounter: Payer: Self-pay | Admitting: Cardiology

## 2014-04-19 VITALS — BP 120/80 | HR 56 | Ht 59.0 in | Wt 171.0 lb

## 2014-04-19 DIAGNOSIS — I480 Paroxysmal atrial fibrillation: Secondary | ICD-10-CM

## 2014-04-19 DIAGNOSIS — R55 Syncope and collapse: Secondary | ICD-10-CM

## 2014-04-19 DIAGNOSIS — E119 Type 2 diabetes mellitus without complications: Secondary | ICD-10-CM

## 2014-04-19 DIAGNOSIS — I10 Essential (primary) hypertension: Secondary | ICD-10-CM

## 2014-04-19 NOTE — Addendum Note (Signed)
Addended by: Golden Hurter D on: 04/19/2014 01:28 PM   Modules accepted: Orders

## 2014-04-19 NOTE — Progress Notes (Signed)
Christina Bishop Date of Birth: 10-Jul-1940 Medical Record #[id number]  History of Present Illness: Christina Bishop is seen for followup post hospitalization. She was admitted 10/02-10/03/15 after a syncopal episode in [place]. She was found to be in Afib with rate 100 bpm. Echo was unremarkable. She was started on metoprolol and Eliquis. She did not want a cardioversion. On follow up today she states she is feeling much better. No recurrent syncope. Sometimes she notes she is lightheaded but if she lies down it passes. She has been under a lot of stress recently. No chest pain or SOB.   Current Outpatient Prescriptions on File Prior to Visit  Medication Sig Dispense Refill  . acetaminophen (TYLENOL) 325 MG tablet Take 2 tablets (650 mg total) by mouth every 4 (four) hours as needed for headache or mild pain.      Marland Kitchen apixaban (ELIQUIS) 5 MG TABS tablet Take 1 tablet (5 mg total) by mouth 2 (two) times daily.  60 tablet  6  . atorvastatin (LIPITOR) 20 MG tablet Take 20 mg by mouth daily.       . Brimonidine Tartrate-Timolol (COMBIGAN OP) Place 1 drop into both eyes 2 (two) times daily.       . cloNIDine (CATAPRES) 0.1 MG tablet Take 0.1 mg by mouth at bedtime.      Marland Kitchen glipiZIDE (GLUCOTROL) 10 MG tablet Take 10 mg by mouth 2 (two) times daily before a meal.      . insulin detemir (LEVEMIR) 100 unit/ml SOLN Inject 12 Units into the skin at bedtime.      Marland Kitchen losartan-hydrochlorothiazide (HYZAAR) 50-12.5 MG per tablet Take 1 tablet by mouth daily.  30 tablet  11  . metoprolol tartrate (LOPRESSOR) 12.5 mg TABS tablet Take 0.5 tablets (12.5 mg total) by mouth 2 (two) times daily.  30 tablet  11  . NEXIUM 40 MG capsule Take 40 mg by mouth daily.       . sertraline (ZOLOFT) 100 MG tablet Take 100 mg by mouth daily.       . sitaGLIPtin (JANUVIA) 100 MG tablet Take 100 mg by mouth daily.      . travoprost, benzalkonium, (TRAVATAN) 0.004 % ophthalmic solution Place 1 drop into both eyes at bedtime.       No  current facility-administered medications on file prior to visit.    Allergies  Allergen Reactions  . Ace Inhibitors     dyspnea  . Codeine Itching    Past Medical History  Diagnosis Date  . DM2 (diabetes mellitus, type 2)     Uncontrolled  . HTN (hypertension)   . Psoriasis   . Psoriatic arthritis   . H/O: hysterectomy   . Depression   . SOB (shortness of breath) on exertion   . Glaucoma   . Hypercholesterolemia   . Obesity   . Paroxysmal atrial fibrillation     Past Surgical History  Procedure Laterality Date  . Total knee arthroplasty    . Cardiac catheterization      History  Smoking status  . Never Smoker   Smokeless tobacco  . Not on file    History  Alcohol Use No    Family History  Problem Relation Age of Onset  . Stroke Mother   . Heart attack Father     Review of Systems: As noted in history of present illness. All other systems were reviewed and are negative.  Physical Exam: BP 120/80  Pulse 56  Ht 4\' 11"  (  1.499 m)  Wt 171 lb (77.565 kg)  BMI 34.52 kg/m2 She is a very pleasant, obese white female in no acute distress. The HEENT exam is normal.  The carotids are 2+ without bruits.  There is no thyromegaly.  There is no JVD.  The lungs are clear.   The heart exam reveals a regular rate with a normal S1 and S2.  There are no murmurs, gallops, or rubs.  The PMI is not displaced.   Abdominal exam is obese. There are no masses.  Exam of the legs reveal no clubbing, cyanosis, or edema.  The distal pulses are intact.  Cranial nerves II - XII are intact.  Motor and sensory functions are intact.  The gait is normal.  LABORATORY DATA:   Echo:Study Conclusions: 03/27/14:  Study Conclusions  - Left ventricle: The cavity size was normal. Wall thickness was normal. Systolic function was normal. The estimated ejection fraction was in the range of 60% to 65%. Wall motion was normal; there were no regional wall motion abnormalities. - Mitral valve:  Calcified annulus.  All lab data from her hospitalization and notes/ DC summary reviewed personally.  Ecg today shows sinus brady with rate 47 bpm. Poor R wave progression. Otherwise normal. I have personally reviewed and interpreted this study.  Assessment / Plan: 1. Paroxysmal atrial fibrillation. Now back in NSR. Associated with syncope. Will continue low dose metoprolol. Continue Eliquis indefinitely.   2. Hypertension, controlled.   3. Hypercholesterolemia, moderate. Now atorvastatin.  4. Diabetes mellitus type 2.  5. Morbid obesity with significant deconditioning.  6. Normal cardiac cath 2009

## 2014-04-19 NOTE — Patient Instructions (Signed)
Continue your current therapy  I will see you in 4 months  

## 2014-04-21 NOTE — Telephone Encounter (Signed)
Patient had appointment with Dr.Jordan 04/19/14.

## 2014-05-10 ENCOUNTER — Telehealth: Payer: Self-pay | Admitting: Cardiology

## 2014-05-10 NOTE — Telephone Encounter (Signed)
She was back in NSR on her last visit. She is on a very low dose of metoprolol. She can stop it and see if hallucinations go away. If not she needs to see her primary care.  [name] Martinique MD, [facility]

## 2014-05-10 NOTE — Telephone Encounter (Signed)
Spoke with pt, aware of dr Doug Sou recommendation.

## 2014-05-10 NOTE — Telephone Encounter (Signed)
Pt been having hallucinations,she thinks it might be the Metoprolol causing it.

## 2014-05-10 NOTE — Telephone Encounter (Signed)
Spoke with pt, she is seeing ghost. She went to her sons house and they were there as well. She has been reading about the metoprolol and it can cause nightmares. She thinks she is awake but now is not sure. This happened to her before and it was related to her medicine. eliquis is the only other new medicine she has been started on. Will forward for dr jordan's review

## 2014-05-10 NOTE — Addendum Note (Signed)
Addended by: Cristopher Estimable on: 05/10/2014 05:19 PM   Modules accepted: Orders, Medications

## 2014-07-15 DIAGNOSIS — I10 Essential (primary) hypertension: Secondary | ICD-10-CM | POA: Diagnosis not present

## 2014-07-15 DIAGNOSIS — E1121 Type 2 diabetes mellitus with diabetic nephropathy: Secondary | ICD-10-CM | POA: Diagnosis not present

## 2014-07-15 DIAGNOSIS — M859 Disorder of bone density and structure, unspecified: Secondary | ICD-10-CM | POA: Diagnosis not present

## 2014-07-22 DIAGNOSIS — E785 Hyperlipidemia, unspecified: Secondary | ICD-10-CM | POA: Diagnosis not present

## 2014-07-22 DIAGNOSIS — I10 Essential (primary) hypertension: Secondary | ICD-10-CM | POA: Diagnosis not present

## 2014-07-22 DIAGNOSIS — E1121 Type 2 diabetes mellitus with diabetic nephropathy: Secondary | ICD-10-CM | POA: Diagnosis not present

## 2014-07-22 DIAGNOSIS — E1165 Type 2 diabetes mellitus with hyperglycemia: Secondary | ICD-10-CM | POA: Diagnosis not present

## 2014-07-22 DIAGNOSIS — E039 Hypothyroidism, unspecified: Secondary | ICD-10-CM | POA: Diagnosis not present

## 2014-07-26 ENCOUNTER — Ambulatory Visit: Payer: Medicare Other | Admitting: Cardiology

## 2014-07-27 DIAGNOSIS — L4050 Arthropathic psoriasis, unspecified: Secondary | ICD-10-CM | POA: Diagnosis not present

## 2014-07-27 DIAGNOSIS — M150 Primary generalized (osteo)arthritis: Secondary | ICD-10-CM | POA: Diagnosis not present

## 2014-07-27 DIAGNOSIS — L401 Generalized pustular psoriasis: Secondary | ICD-10-CM | POA: Diagnosis not present

## 2014-07-27 DIAGNOSIS — M5136 Other intervertebral disc degeneration, lumbar region: Secondary | ICD-10-CM | POA: Diagnosis not present

## 2014-10-04 ENCOUNTER — Ambulatory Visit (INDEPENDENT_AMBULATORY_CARE_PROVIDER_SITE_OTHER): Payer: Medicare Other | Admitting: Cardiology

## 2014-10-04 ENCOUNTER — Encounter: Payer: Self-pay | Admitting: Cardiology

## 2014-10-04 VITALS — BP 142/72 | HR 108 | Ht <= 58 in | Wt 177.9 lb

## 2014-10-04 DIAGNOSIS — I10 Essential (primary) hypertension: Secondary | ICD-10-CM

## 2014-10-04 DIAGNOSIS — E119 Type 2 diabetes mellitus without complications: Secondary | ICD-10-CM

## 2014-10-04 DIAGNOSIS — I480 Paroxysmal atrial fibrillation: Secondary | ICD-10-CM | POA: Diagnosis not present

## 2014-10-04 MED ORDER — APIXABAN 5 MG PO TABS: 5.0000 mg | ORAL_TABLET | Freq: Two times a day (BID) | ORAL | Status: DC

## 2014-10-04 NOTE — Progress Notes (Signed)
[name] Date of Birth: 12/14/1940 Medical Record #[id number]  History of Present Illness: Mrs. Christina Bishop is seen for followup atrial fibrillation. She was admitted 10/02-10/03/15 after a syncopal episode in [place]. She was found to be in Afib with rate 100 bpm. Echo was unremarkable. She was started on metoprolol and Eliquis. She did not want a cardioversion. On subsequent follow up she had converted to NSR. She later developed significant hallucinations on metoprolol and this was discontinued with resolution. She still notes arrhythmias at times- mostly at night. States it feels like her heart "drips". It really doesn't bother her. On initial presentation today pulse was 106 bpm but when I saw her it was 72 and regular.   Current Outpatient Prescriptions on File Prior to Visit  Medication Sig Dispense Refill  . acetaminophen (TYLENOL) 325 MG tablet Take 2 tablets (650 mg total) by mouth every 4 (four) hours as needed for headache or mild pain.    Marland Kitchen atorvastatin (LIPITOR) 20 MG tablet Take 20 mg by mouth daily.     . Brimonidine Tartrate-Timolol (COMBIGAN OP) Place 1 drop into both eyes 2 (two) times daily.     . cloNIDine (CATAPRES) 0.1 MG tablet Take 0.1 mg by mouth at bedtime.    . furosemide (LASIX) 10 MG/ML solution Take 20 mg by mouth daily.    Marland Kitchen glipiZIDE (GLUCOTROL) 10 MG tablet Take 10 mg by mouth 2 (two) times daily before a meal.    . insulin detemir (LEVEMIR) 100 unit/ml SOLN Inject 20 Units into the skin at bedtime.     Marland Kitchen losartan-hydrochlorothiazide (HYZAAR) 50-12.5 MG per tablet Take 1 tablet by mouth daily. 30 tablet 11  . NEXIUM 40 MG capsule Take 40 mg by mouth daily.     . sertraline (ZOLOFT) 100 MG tablet Take 100 mg by mouth daily.     . sitaGLIPtin (JANUVIA) 100 MG tablet Take 100 mg by mouth daily.    . travoprost, benzalkonium, (TRAVATAN) 0.004 % ophthalmic solution Place 1 drop into both eyes at bedtime.     No current facility-administered medications on  file prior to visit.    Allergies  Allergen Reactions  . Ace Inhibitors     dyspnea  . Codeine Itching  . Metoprolol Other (See Comments)    Hallucinations    Past Medical History  Diagnosis Date  . DM2 (diabetes mellitus, type 2)     Uncontrolled  . HTN (hypertension)   . Psoriasis   . Psoriatic arthritis   . H/O: hysterectomy   . Depression   . SOB (shortness of breath) on exertion   . Glaucoma   . Hypercholesterolemia   . Obesity   . Paroxysmal atrial fibrillation     Past Surgical History  Procedure Laterality Date  . Total knee arthroplasty    . Cardiac catheterization      History  Smoking status  . Never Smoker   Smokeless tobacco  . Not on file    History  Alcohol Use No    Family History  Problem Relation Age of Onset  . Stroke Mother   . Heart attack Father     Review of Systems: As noted in history of present illness. All other systems were reviewed and are negative.  Physical Exam: BP 142/72 mmHg  Pulse 108  Ht 4\' 9"  (1.448 m)  Wt 177 lb 14.4 oz (80.695 kg)  BMI 38.49 kg/m2 She is a very pleasant, obese white female in no acute distress.  The HEENT exam is normal.  The carotids are 2+ without bruits.  There is no thyromegaly.  There is no JVD.  The lungs are clear.   The heart exam reveals a regular rate with a normal S1 and S2.  There are no murmurs, gallops, or rubs.  The PMI is not displaced.   Abdominal exam is obese. There are no masses.  Exam of the legs reveal no clubbing, cyanosis, or edema.  The distal pulses are intact.  Cranial nerves II - XII are intact.  Motor and sensory functions are intact.  The gait is normal.  LABORATORY DATA:   Echo:Study Conclusions: 03/27/14:  Study Conclusions  - Left ventricle: The cavity size was normal. Wall thickness was normal. Systolic function was normal. The estimated ejection fraction was in the range of 60% to 65%. Wall motion was normal; there were no regional wall motion  abnormalities. - Mitral valve: Calcified annulus.    Assessment / Plan: 1. Paroxysmal atrial fibrillation. Now  in NSR but I suspect she is in and out of rhythm. Minimally symptomatic. HR fairly well controlled on no meds. Intolerant of metoprolol due to hallucinations.  Continue Eliquis indefinitely.   2. Hypertension, controlled.   3. Hypercholesterolemia, moderate. Now atorvastatin.  4. Diabetes mellitus type 2.  5. Morbid obesity   6. Normal cardiac cath 2009  I will follow up in 6 months.

## 2014-10-04 NOTE — Patient Instructions (Signed)
Continue your current therapy  I will see you in 6 months.   

## 2014-10-13 DIAGNOSIS — M859 Disorder of bone density and structure, unspecified: Secondary | ICD-10-CM | POA: Diagnosis not present

## 2014-10-13 DIAGNOSIS — Z1231 Encounter for screening mammogram for malignant neoplasm of breast: Secondary | ICD-10-CM | POA: Diagnosis not present

## 2014-10-13 DIAGNOSIS — N182 Chronic kidney disease, stage 2 (mild): Secondary | ICD-10-CM | POA: Diagnosis not present

## 2014-10-13 DIAGNOSIS — E785 Hyperlipidemia, unspecified: Secondary | ICD-10-CM | POA: Diagnosis not present

## 2014-10-13 DIAGNOSIS — I10 Essential (primary) hypertension: Secondary | ICD-10-CM | POA: Diagnosis not present

## 2014-10-13 DIAGNOSIS — E1121 Type 2 diabetes mellitus with diabetic nephropathy: Secondary | ICD-10-CM | POA: Diagnosis not present

## 2014-10-13 DIAGNOSIS — E559 Vitamin D deficiency, unspecified: Secondary | ICD-10-CM | POA: Diagnosis not present

## 2014-10-13 DIAGNOSIS — E039 Hypothyroidism, unspecified: Secondary | ICD-10-CM | POA: Diagnosis not present

## 2014-10-18 DIAGNOSIS — H02839 Dermatochalasis of unspecified eye, unspecified eyelid: Secondary | ICD-10-CM | POA: Diagnosis not present

## 2014-10-18 DIAGNOSIS — H2513 Age-related nuclear cataract, bilateral: Secondary | ICD-10-CM | POA: Diagnosis not present

## 2014-10-18 DIAGNOSIS — H409 Unspecified glaucoma: Secondary | ICD-10-CM | POA: Diagnosis not present

## 2014-10-26 DIAGNOSIS — I10 Essential (primary) hypertension: Secondary | ICD-10-CM | POA: Diagnosis not present

## 2014-10-26 DIAGNOSIS — E1129 Type 2 diabetes mellitus with other diabetic kidney complication: Secondary | ICD-10-CM | POA: Diagnosis not present

## 2014-10-26 DIAGNOSIS — Z23 Encounter for immunization: Secondary | ICD-10-CM | POA: Diagnosis not present

## 2014-10-26 DIAGNOSIS — E785 Hyperlipidemia, unspecified: Secondary | ICD-10-CM | POA: Diagnosis not present

## 2014-10-26 DIAGNOSIS — H6120 Impacted cerumen, unspecified ear: Secondary | ICD-10-CM | POA: Diagnosis not present

## 2014-10-26 DIAGNOSIS — M8580 Other specified disorders of bone density and structure, unspecified site: Secondary | ICD-10-CM | POA: Diagnosis not present

## 2014-11-11 DIAGNOSIS — M545 Low back pain: Secondary | ICD-10-CM | POA: Diagnosis not present

## 2014-11-11 DIAGNOSIS — M25551 Pain in right hip: Secondary | ICD-10-CM | POA: Diagnosis not present

## 2014-11-25 DIAGNOSIS — L4050 Arthropathic psoriasis, unspecified: Secondary | ICD-10-CM | POA: Diagnosis not present

## 2014-11-25 DIAGNOSIS — L401 Generalized pustular psoriasis: Secondary | ICD-10-CM | POA: Diagnosis not present

## 2014-11-25 DIAGNOSIS — M150 Primary generalized (osteo)arthritis: Secondary | ICD-10-CM | POA: Diagnosis not present

## 2014-11-25 DIAGNOSIS — M5136 Other intervertebral disc degeneration, lumbar region: Secondary | ICD-10-CM | POA: Diagnosis not present

## 2015-01-19 ENCOUNTER — Other Ambulatory Visit: Payer: Self-pay | Admitting: Cardiology

## 2015-01-26 DIAGNOSIS — L821 Other seborrheic keratosis: Secondary | ICD-10-CM | POA: Diagnosis not present

## 2015-01-26 DIAGNOSIS — L820 Inflamed seborrheic keratosis: Secondary | ICD-10-CM | POA: Diagnosis not present

## 2015-01-26 DIAGNOSIS — D1801 Hemangioma of skin and subcutaneous tissue: Secondary | ICD-10-CM | POA: Diagnosis not present

## 2015-02-14 ENCOUNTER — Encounter (HOSPITAL_COMMUNITY): Payer: Self-pay | Admitting: Emergency Medicine

## 2015-02-14 ENCOUNTER — Telehealth: Payer: Self-pay | Admitting: Cardiology

## 2015-02-14 ENCOUNTER — Observation Stay (HOSPITAL_COMMUNITY)
Admission: EM | Admit: 2015-02-14 | Discharge: 2015-02-17 | Disposition: A | Discharge status: Home / Self Care | Payer: Medicare Other | Attending: Internal Medicine | Admitting: Internal Medicine

## 2015-02-14 ENCOUNTER — Emergency Department (HOSPITAL_COMMUNITY): Payer: Medicare Other

## 2015-02-14 DIAGNOSIS — H409 Unspecified glaucoma: Secondary | ICD-10-CM | POA: Insufficient documentation

## 2015-02-14 DIAGNOSIS — K219 Gastro-esophageal reflux disease without esophagitis: Secondary | ICD-10-CM | POA: Insufficient documentation

## 2015-02-14 DIAGNOSIS — Z79899 Other long term (current) drug therapy: Secondary | ICD-10-CM | POA: Diagnosis not present

## 2015-02-14 DIAGNOSIS — E7800 Pure hypercholesterolemia, unspecified: Secondary | ICD-10-CM | POA: Diagnosis present

## 2015-02-14 DIAGNOSIS — R079 Chest pain, unspecified: Secondary | ICD-10-CM

## 2015-02-14 DIAGNOSIS — Z823 Family history of stroke: Secondary | ICD-10-CM | POA: Insufficient documentation

## 2015-02-14 DIAGNOSIS — Z8249 Family history of ischemic heart disease and other diseases of the circulatory system: Secondary | ICD-10-CM | POA: Diagnosis not present

## 2015-02-14 DIAGNOSIS — Z6838 Body mass index (BMI) 38.0-38.9, adult: Secondary | ICD-10-CM | POA: Insufficient documentation

## 2015-02-14 DIAGNOSIS — R001 Bradycardia, unspecified: Secondary | ICD-10-CM | POA: Diagnosis not present

## 2015-02-14 DIAGNOSIS — I10 Essential (primary) hypertension: Secondary | ICD-10-CM | POA: Insufficient documentation

## 2015-02-14 DIAGNOSIS — R0609 Other forms of dyspnea: Secondary | ICD-10-CM

## 2015-02-14 DIAGNOSIS — Z794 Long term (current) use of insulin: Secondary | ICD-10-CM | POA: Diagnosis not present

## 2015-02-14 DIAGNOSIS — I493 Ventricular premature depolarization: Secondary | ICD-10-CM | POA: Diagnosis not present

## 2015-02-14 DIAGNOSIS — E669 Obesity, unspecified: Secondary | ICD-10-CM | POA: Diagnosis not present

## 2015-02-14 DIAGNOSIS — M069 Rheumatoid arthritis, unspecified: Secondary | ICD-10-CM | POA: Diagnosis not present

## 2015-02-14 DIAGNOSIS — E785 Hyperlipidemia, unspecified: Secondary | ICD-10-CM | POA: Diagnosis not present

## 2015-02-14 DIAGNOSIS — Z885 Allergy status to narcotic agent status: Secondary | ICD-10-CM | POA: Diagnosis not present

## 2015-02-14 DIAGNOSIS — Z888 Allergy status to other drugs, medicaments and biological substances status: Secondary | ICD-10-CM | POA: Insufficient documentation

## 2015-02-14 DIAGNOSIS — R1030 Lower abdominal pain, unspecified: Secondary | ICD-10-CM | POA: Insufficient documentation

## 2015-02-14 DIAGNOSIS — R55 Syncope and collapse: Secondary | ICD-10-CM | POA: Diagnosis present

## 2015-02-14 DIAGNOSIS — E1165 Type 2 diabetes mellitus with hyperglycemia: Secondary | ICD-10-CM | POA: Insufficient documentation

## 2015-02-14 DIAGNOSIS — Z90710 Acquired absence of both cervix and uterus: Secondary | ICD-10-CM | POA: Diagnosis not present

## 2015-02-14 DIAGNOSIS — R072 Precordial pain: Secondary | ICD-10-CM

## 2015-02-14 DIAGNOSIS — Z96653 Presence of artificial knee joint, bilateral: Secondary | ICD-10-CM | POA: Insufficient documentation

## 2015-02-14 DIAGNOSIS — R0602 Shortness of breath: Secondary | ICD-10-CM | POA: Diagnosis not present

## 2015-02-14 DIAGNOSIS — R0789 Other chest pain: Secondary | ICD-10-CM | POA: Diagnosis not present

## 2015-02-14 DIAGNOSIS — I480 Paroxysmal atrial fibrillation: Secondary | ICD-10-CM | POA: Diagnosis not present

## 2015-02-14 DIAGNOSIS — R42 Dizziness and giddiness: Secondary | ICD-10-CM | POA: Insufficient documentation

## 2015-02-14 DIAGNOSIS — Z7982 Long term (current) use of aspirin: Secondary | ICD-10-CM | POA: Diagnosis not present

## 2015-02-14 DIAGNOSIS — F329 Major depressive disorder, single episode, unspecified: Secondary | ICD-10-CM | POA: Insufficient documentation

## 2015-02-14 DIAGNOSIS — E119 Type 2 diabetes mellitus without complications: Secondary | ICD-10-CM

## 2015-02-14 DIAGNOSIS — Z7901 Long term (current) use of anticoagulants: Secondary | ICD-10-CM | POA: Diagnosis not present

## 2015-02-14 DIAGNOSIS — R1110 Vomiting, unspecified: Secondary | ICD-10-CM | POA: Diagnosis not present

## 2015-02-14 DIAGNOSIS — L4050 Arthropathic psoriasis, unspecified: Secondary | ICD-10-CM | POA: Diagnosis not present

## 2015-02-14 HISTORY — DX: Chest pain, unspecified: R07.9

## 2015-02-14 HISTORY — DX: Unspecified osteoarthritis, unspecified site: M19.90

## 2015-02-14 LAB — MAGNESIUM: Magnesium: 1.8 mg/dL (ref 1.7–2.4)

## 2015-02-14 LAB — BASIC METABOLIC PANEL
Anion gap: 12 (ref 5–15)
BUN: 22 mg/dL — ABNORMAL HIGH (ref 6–20)
CO2: 23 mmol/L (ref 22–32)
Calcium: 9.5 mg/dL (ref 8.9–10.3)
Chloride: 101 mmol/L (ref 101–111)
Creatinine, Ser: 1.04 mg/dL — ABNORMAL HIGH (ref 0.44–1.00)
GFR calc Af Amer: >60 mL/min (ref >60)
GFR calc non Af Amer: 52 mL/min — ABNORMAL LOW (ref >60)
Glucose, Bld: 226 mg/dL — ABNORMAL HIGH (ref 65–99)
Potassium: 3.8 mmol/L (ref 3.5–5.1)
Sodium: 136 mmol/L (ref 135–145)

## 2015-02-14 LAB — CBC
HCT: 42.7 % (ref 36.0–46.0)
Hemoglobin: 14.9 g/dL (ref 12.0–15.0)
MCH: 29 pg (ref 26.0–34.0)
MCHC: 34.9 g/dL (ref 30.0–36.0)
MCV: 83.1 fL (ref 78.0–100.0)
Platelets: 305 10*3/uL (ref 150–400)
RBC: 5.14 MIL/uL — ABNORMAL HIGH (ref 3.87–5.11)
RDW: 12.8 % (ref 11.5–15.5)
WBC: 9.5 10*3/uL (ref 4.0–10.5)

## 2015-02-14 LAB — HEPATIC FUNCTION PANEL
ALT: 13 U/L — ABNORMAL LOW (ref 14–54)
AST: 26 U/L (ref 15–41)
Albumin: 3.9 g/dL (ref 3.5–5.0)
Alkaline Phosphatase: 73 U/L (ref 38–126)
Bilirubin, Direct: 0.3 mg/dL (ref 0.1–0.5)
Indirect Bilirubin: 0.8 mg/dL (ref 0.3–0.9)
Total Bilirubin: 1.1 mg/dL (ref 0.3–1.2)
Total Protein: 7.1 g/dL (ref 6.5–8.1)

## 2015-02-14 LAB — TROPONIN I: Troponin I: <0.03 ng/mL (ref <0.031)

## 2015-02-14 LAB — POC OCCULT BLOOD, ED: Fecal Occult Bld: NEGATIVE

## 2015-02-14 LAB — LIPASE, BLOOD: Lipase: 26 U/L (ref 22–51)

## 2015-02-14 LAB — GLUCOSE, CAPILLARY: Glucose-Capillary: 270 mg/dL — ABNORMAL HIGH (ref 65–99)

## 2015-02-14 LAB — BRAIN NATRIURETIC PEPTIDE: B Natriuretic Peptide: 37.1 pg/mL (ref 0.0–100.0)

## 2015-02-14 LAB — I-STAT TROPONIN, ED: Troponin i, poc: 0 ng/mL (ref 0.00–0.08)

## 2015-02-14 MED ORDER — FUROSEMIDE 20 MG PO TABS
20.0000 mg | ORAL_TABLET | Freq: Every day | ORAL | Status: DC
  Administered 2015-02-14 – 2015-02-17 (×4): 20 mg via ORAL
  Filled 2015-02-14 (×5): qty 1

## 2015-02-14 MED ORDER — TRAVOPROST (BAK FREE) 0.004 % OP SOLN
1.0000 [drp] | Freq: Every day | OPHTHALMIC | Status: DC
  Administered 2015-02-14 – 2015-02-16 (×3): 1 [drp] via OPHTHALMIC
  Filled 2015-02-14: qty 2.5

## 2015-02-14 MED ORDER — LOSARTAN POTASSIUM-HCTZ 50-12.5 MG PO TABS: 1.0000 | ORAL_TABLET | Freq: Every day | ORAL | Status: DC

## 2015-02-14 MED ORDER — HYDRALAZINE HCL 20 MG/ML IJ SOLN
10.0000 mg | Freq: Once | INTRAMUSCULAR | Status: AC
  Administered 2015-02-14: 10 mg via INTRAVENOUS
  Filled 2015-02-14: qty 1

## 2015-02-14 MED ORDER — ASPIRIN EC 325 MG PO TBEC
325.0000 mg | DELAYED_RELEASE_TABLET | Freq: Every day | ORAL | Status: DC
  Administered 2015-02-15 – 2015-02-17 (×3): 325 mg via ORAL
  Filled 2015-02-14 (×4): qty 1

## 2015-02-14 MED ORDER — AMLODIPINE BESYLATE 10 MG PO TABS
10.0000 mg | ORAL_TABLET | Freq: Every day | ORAL | Status: DC
  Administered 2015-02-14 – 2015-02-17 (×4): 10 mg via ORAL
  Filled 2015-02-14 (×5): qty 1

## 2015-02-14 MED ORDER — SODIUM CHLORIDE 0.9 % IV BOLUS (SEPSIS): 1000.0000 mL | Freq: Once | INTRAVENOUS | Status: DC

## 2015-02-14 MED ORDER — LINAGLIPTIN 5 MG PO TABS
5.0000 mg | ORAL_TABLET | Freq: Every day | ORAL | Status: DC
  Administered 2015-02-14 – 2015-02-17 (×4): 5 mg via ORAL
  Filled 2015-02-14 (×5): qty 1

## 2015-02-14 MED ORDER — APIXABAN 5 MG PO TABS
5.0000 mg | ORAL_TABLET | Freq: Two times a day (BID) | ORAL | Status: DC
  Administered 2015-02-14 – 2015-02-17 (×6): 5 mg via ORAL
  Filled 2015-02-14 (×8): qty 1

## 2015-02-14 MED ORDER — LOSARTAN POTASSIUM 50 MG PO TABS
50.0000 mg | ORAL_TABLET | Freq: Every day | ORAL | Status: DC
  Administered 2015-02-14 – 2015-02-17 (×4): 50 mg via ORAL
  Filled 2015-02-14 (×5): qty 1

## 2015-02-14 MED ORDER — ATORVASTATIN CALCIUM 20 MG PO TABS
20.0000 mg | ORAL_TABLET | Freq: Every day | ORAL | Status: DC
  Administered 2015-02-15 – 2015-02-17 (×3): 20 mg via ORAL
  Filled 2015-02-14 (×5): qty 1

## 2015-02-14 MED ORDER — CLONIDINE HCL 0.1 MG PO TABS
0.1000 mg | ORAL_TABLET | Freq: Two times a day (BID) | ORAL | Status: DC
  Administered 2015-02-14 – 2015-02-17 (×6): 0.1 mg via ORAL
  Filled 2015-02-14 (×8): qty 1

## 2015-02-14 MED ORDER — ASPIRIN 81 MG PO CHEW
324.0000 mg | CHEWABLE_TABLET | Freq: Once | ORAL | Status: AC
  Administered 2015-02-14: 324 mg via ORAL
  Filled 2015-02-14: qty 4

## 2015-02-14 MED ORDER — INSULIN ASPART 100 UNIT/ML ~~LOC~~ SOLN
0.0000 [IU] | Freq: Three times a day (TID) | SUBCUTANEOUS | Status: DC
  Administered 2015-02-15: 2 [IU] via SUBCUTANEOUS
  Administered 2015-02-16: 1 [IU] via SUBCUTANEOUS
  Administered 2015-02-16: 2 [IU] via SUBCUTANEOUS

## 2015-02-14 MED ORDER — HYDROCHLOROTHIAZIDE 12.5 MG PO CAPS
12.5000 mg | ORAL_CAPSULE | Freq: Every day | ORAL | Status: DC
  Administered 2015-02-14 – 2015-02-17 (×4): 12.5 mg via ORAL
  Filled 2015-02-14 (×4): qty 1

## 2015-02-14 MED ORDER — SODIUM CHLORIDE 0.9 % IV BOLUS (SEPSIS)
1000.0000 mL | Freq: Once | INTRAVENOUS | Status: AC
  Administered 2015-02-14: 1000 mL via INTRAVENOUS

## 2015-02-14 MED ORDER — SODIUM CHLORIDE 0.9 % IJ SOLN
3.0000 mL | Freq: Two times a day (BID) | INTRAMUSCULAR | Status: DC
  Administered 2015-02-14 – 2015-02-17 (×6): 3 mL via INTRAVENOUS

## 2015-02-14 MED ORDER — HYDRALAZINE HCL 20 MG/ML IJ SOLN: 10.0000 mg | Freq: Four times a day (QID) | INTRAMUSCULAR | Status: DC | PRN

## 2015-02-14 MED ORDER — PANTOPRAZOLE SODIUM 40 MG PO TBEC
40.0000 mg | DELAYED_RELEASE_TABLET | Freq: Every day | ORAL | Status: DC
  Administered 2015-02-14 – 2015-02-17 (×4): 40 mg via ORAL
  Filled 2015-02-14 (×4): qty 1

## 2015-02-14 MED ORDER — INSULIN DETEMIR 100 UNIT/ML ~~LOC~~ SOLN
20.0000 [IU] | Freq: Every day | SUBCUTANEOUS | Status: DC
  Administered 2015-02-14 – 2015-02-16 (×3): 20 [IU] via SUBCUTANEOUS
  Filled 2015-02-14 (×5): qty 0.2

## 2015-02-14 MED ORDER — SERTRALINE HCL 100 MG PO TABS
100.0000 mg | ORAL_TABLET | Freq: Every day | ORAL | Status: DC
  Administered 2015-02-14 – 2015-02-17 (×4): 100 mg via ORAL
  Filled 2015-02-14 (×5): qty 1

## 2015-02-14 NOTE — ED Provider Notes (Signed)
CSN: [id number]     Arrival date & time 02/14/15  0917 History   First MD Initiated Contact with Patient 02/14/15 0932     Chief Complaint  Patient presents with  . Chest Pain     (Consider location/radiation/quality/duration/timing/severity/associated sxs/prior Treatment) HPI  74 year old female who presents with chest pressure and near syncope. She has a history of type 2 diabetes, hypertension, hyperlipidemia, and paroxysmal atrial fibrillation on Eliquis. Reports that she has not been feeling well over the past 3 days. 3 days ago, while eating at a restaurant, she felt near syncopal. This is subsequently associated with severe diaphoresis and episode of nausea with vomiting. Emesis is non-bloody and non-bilious. She had recurrent episodes of near syncope with diaphoresis 2 times while grocery shopping 2 days ago. Over the past 24 hours she has noted chest pressure, shortness of breath with minimal exertion, and fatigue. Week ago noted black stools, but reports that that has all fully resolved. Has had intermittent epigastric discomfort with this as well. Denies any diarrhea, constipation, syncope, fevers or chills.  Past Medical History  Diagnosis Date  . DM2 (diabetes mellitus, type 2)     Uncontrolled  . HTN (hypertension)   . Psoriasis   . Psoriatic arthritis   . H/O: hysterectomy   . Depression   . SOB (shortness of breath) on exertion   . Glaucoma   . Hypercholesterolemia   . Obesity   . Paroxysmal atrial fibrillation    Past Surgical History  Procedure Laterality Date  . Total knee arthroplasty    . Cardiac catheterization     Family History  Problem Relation Age of Onset  . Stroke Mother   . Heart attack Father    Social History  Substance Use Topics  . Smoking status: Never Smoker   . Smokeless tobacco: None  . Alcohol Use: No   OB History    No data available     Review of Systems 10/14 systems reviewed and are negative other than those stated in the  HPI  Allergies  Ace inhibitors; Codeine; and Metoprolol  Home Medications   Prior to Admission medications   Medication Sig Start Date End Date Taking? Authorizing Provider  acetaminophen (TYLENOL) 325 MG tablet Take 2 tablets (650 mg total) by mouth every 4 (four) hours as needed for headache or mild pain. 03/27/14   Erlene Quan, PA-C  amLODipine (NORVASC) 10 MG tablet Take 10 mg by mouth daily. 09/17/14   Historical Provider, MD  apixaban (ELIQUIS) 5 MG TABS tablet Take 1 tablet (5 mg total) by mouth 2 (two) times daily. 10/04/14   Peter M Martinique, MD  atorvastatin (LIPITOR) 20 MG tablet Take 20 mg by mouth daily.  02/06/12   Historical Provider, MD  Brimonidine Tartrate-Timolol (COMBIGAN OP) Place 1 drop into both eyes 2 (two) times daily.     Historical Provider, MD  cloNIDine (CATAPRES) 0.1 MG tablet Take 0.1 mg by mouth at bedtime.    Historical Provider, MD  furosemide (LASIX) 10 MG/ML solution Take 20 mg by mouth daily.    Historical Provider, MD  furosemide (LASIX) 20 MG tablet Take 1 tablet (20 mg total) by mouth daily. 01/19/15   Peter M Martinique, MD  glipiZIDE (GLUCOTROL) 10 MG tablet Take 10 mg by mouth 2 (two) times daily before a meal.    Historical Provider, MD  HUMIRA 40 MG/0.8ML PSKT Inject 40 mg as directed. Every other week 09/10/14   Historical Provider, MD  insulin detemir (  LEVEMIR) 100 unit/ml SOLN Inject 20 Units into the skin at bedtime.     Historical Provider, MD  losartan-hydrochlorothiazide (HYZAAR) 50-12.5 MG per tablet Take 1 tablet by mouth daily. 02/13/13   Tanda Rockers, MD  NEXIUM 40 MG capsule Take 40 mg by mouth daily.  01/29/12   Historical Provider, MD  predniSONE (DELTASONE) 5 MG tablet Take 5 mg by mouth as needed. 1 1/2 tablets as needed 07/27/14   Historical Provider, MD  sertraline (ZOLOFT) 100 MG tablet Take 100 mg by mouth daily.  02/24/12   Historical Provider, MD  sitaGLIPtin (JANUVIA) 100 MG tablet Take 100 mg by mouth daily.    Historical Provider, MD   travoprost, benzalkonium, (TRAVATAN) 0.004 % ophthalmic solution Place 1 drop into both eyes at bedtime.    Historical Provider, MD   BP 179/69 mmHg  Pulse 55  Temp(Src) 98.1 F (36.7 C) (Oral)  Resp 16  Ht 4\' 10"  (1.473 m)  Wt 174 lb (78.926 kg)  BMI 36.38 kg/m2  SpO2 99% Physical Exam Physical Exam  Nursing note and vitals reviewed. Constitutional: Well developed, well nourished, non-toxic, and in no acute distress Head: Normocephalic and atraumatic.  Mouth/Throat: Oropharynx is clear and dry.  Neck: Normal range of motion. Neck supple.  Cardiovascular: Normal rate and regular rhythm.   Pulmonary/Chest: Effort normal and breath sounds normal.  Abdominal: Soft. There is no tenderness. There is no rebound and no guarding. Brown stools on rectal exam. No melena or hematochezia. Musculoskeletal: Normal range of motion.  Neurological: Alert, no facial droop, fluent speech, moves all extremities symmetrically Skin: Skin is warm and dry.  Psychiatric: Cooperative  ED Course  Procedures (including critical care time) Labs Review Labs Reviewed  BASIC METABOLIC PANEL - Abnormal; Notable for the following:    Glucose, Bld 226 (*)    BUN 22 (*)    Creatinine, Ser 1.04 (*)    GFR calc non Af Amer 52 (*)    All other components within normal limits  CBC - Abnormal; Notable for the following:    RBC 5.14 (*)    All other components within normal limits  HEPATIC FUNCTION PANEL - Abnormal; Notable for the following:    ALT 13 (*)    All other components within normal limits  MAGNESIUM  LIPASE, BLOOD  OCCULT BLOOD X 1 CARD TO LAB, STOOL  I-STAT TROPOININ, ED    Imaging Review Dg Chest 2 View  02/14/2015   CLINICAL DATA:  Vomiting, near syncope over past several days. Chest pain.  EXAM: CHEST  2 VIEW  COMPARISON:  01/12/2008  FINDINGS: The heart size and mediastinal contours are within normal limits. Both lungs are clear. The visualized skeletal structures are unremarkable.   IMPRESSION: No active cardiopulmonary disease.   Electronically Signed   By: Rolm Baptise M.D.   On: 02/14/2015 10:36   I have personally reviewed and evaluated these images and lab results as part of my medical decision-making.   EKG Interpretation   Date/Time:  Monday February 14 2015 09:31:34 EDT Ventricular Rate:  74 PR Interval:  154 QRS Duration: 90 QT Interval:  384 QTC Calculation: 426 R Axis:   -54 Text Interpretation:  Sinus rhythm with occasional Premature ventricular  complexes Left anterior fascicular block Prior EKG showing atrial  fibrillation, but no other changes when compared to prior EKG Confirmed by  Fany Cavanaugh MD, Kitty Cadavid ([id number]) on 02/14/2015 9:33:38 AM      MDM   Final diagnoses:  None  In short, this is 74 year old female who presents with chest pressure and multiple near syncopal episodes.  vital signs are non-concerning and exam, including cardiopulmonary exam is unremarkable.  Multiple risk factors for CAD, and c/f atypical presentation of ACS given history of poorly controlled DM-2 and age. EKG is nonischemic in nature and is no stigmata of arrhythmia. No evidence of atrial fibrillation currently. Troponin 1 is negative. Heart score > 3.  Chest x-ray shows no acute cardiopulmonary processes.  Hemoglobin is stable, and she has no evidence of acute bleeding on rectal exam. Abdomen is soft and nontender currently.  Remainder of blood work is unremarkable. She does tell me that she has been trying to lose 5 pounds within a very short period of time as her PCP has told her that she needed to lose weight, and she has not tried to do so until just recently as she is nearing her follow-up appointment. She has occasionally skips meals during this period of time. I believe that this certainly could have played a role in her near-syncopal symptoms. However, given her multiple risk factors of CAD and chest pressure, she is admitted [facility] for the remainder of her cardiac  rule out.   Forde Dandy, MD 02/14/15 [phone]

## 2015-02-14 NOTE — ED Notes (Signed)
Patient transported to X-ray 

## 2015-02-14 NOTE — Progress Notes (Signed)
[place] admitted to room [phone].  Came to [facility] with c/o chest pain.  Denies pain at present.  Placed on telemetry box [id number].  CCMD called to confirm CSN number and telemetry box.

## 2015-02-14 NOTE — Consult Note (Signed)
Cardiologist: Martinique Reason for Consult: Chest pain, dizziness Referring Physician: Carime Dinkel is an 74 y.o. female.  HPI:   Patient is a 74 year old female with history of diabetes mellitus, hypertension, atrial fibrillation, psoriatic arthritis, depression, hyperlipidemia and obesity.  In the fall of 2015 she had a syncopal episode was found to be in atrial fibrillation and was started on metoprolol and Eliquis. Her echocardiogram at that time indicated an ejection fraction of 60-65% with normal wall motion.   Patient reports on Friday she felt like she was in the past very similar to last October when she was admitted atrial fibrillation. Occurred again on Saturday morning and in the afternoon.  She also reports nausea, projectile vomiting 3 out of 10 chest pressure, dyspnea on exertion.  She says she felt sweaty this may have just been from being outside in the heat. She's had some mild lower abdominal pain.  She reports having some bright red blood in her stool last week however, Hemoccult-negative.  She says she has GERD so bad sometimes it comes out her ears.   The patient currently denies  fever, chest pain, shortness of breath, orthopnea,  PND, cough, congestion, melena, lower extremity edema,    Past Medical History  Diagnosis Date  . DM2 (diabetes mellitus, type 2)     Uncontrolled  . HTN (hypertension)   . Psoriasis   . Psoriatic arthritis   . H/O: hysterectomy   . Depression   . SOB (shortness of breath) on exertion   . Glaucoma   . Hypercholesterolemia   . Obesity   . Paroxysmal atrial fibrillation     Past Surgical History  Procedure Laterality Date  . Total knee arthroplasty    . Cardiac catheterization      Family History  Problem Relation Age of Onset  . Stroke Mother   . Heart attack Father     Social History:  reports that she has never smoked. She does not have any smokeless tobacco history on file. She reports that she does not drink  alcohol or use illicit drugs.  Allergies:  Allergies  Allergen Reactions  . Ace Inhibitors     dyspnea  . Codeine Itching  . Metoprolol Other (See Comments)    Hallucinations    Medications:  Medication Sig  acetaminophen (TYLENOL) 325 MG tablet Take 2 tablets (650 mg total) by mouth every 4 (four) hours as needed for headache or mild pain.  amLODipine (NORVASC) 10 MG tablet Take 10 mg by mouth daily.  apixaban (ELIQUIS) 5 MG TABS tablet Take 1 tablet (5 mg total) by mouth 2 (two) times daily.  Brimonidine Tartrate-Timolol (COMBIGAN OP) Place 1 drop into both eyes 2 (two) times daily.   Ca Carbonate-Mag Hydroxide (ROLAIDS PO) Take 1-2 tablets by mouth daily as needed (reflux).  cloNIDine (CATAPRES) 0.1 MG tablet Take 0.1 mg by mouth at bedtime.  furosemide (LASIX) 10 MG/ML solution Take 20 mg by mouth daily.  furosemide (LASIX) 20 MG tablet Take 1 tablet (20 mg total) by mouth daily.  glipiZIDE (GLUCOTROL) 10 MG tablet Take 10 mg by mouth 2 (two) times daily before a meal.  HUMIRA 40 MG/0.8ML PSKT Inject 40 mg as directed every 14 (fourteen) days. Every other week  insulin detemir (LEVEMIR) 100 unit/ml SOLN Inject 20 Units into the skin at bedtime.   losartan-hydrochlorothiazide (HYZAAR) 50-12.5 MG per tablet Take 1 tablet by mouth daily.  NEXIUM 40 MG capsule Take 40 mg by mouth daily.  predniSONE (DELTASONE) 5 MG tablet Take 10 mg by mouth as needed (flare up). 1 1/2 tablets as needed  sertraline (ZOLOFT) 100 MG tablet Take 100 mg by mouth daily.   sitaGLIPtin (JANUVIA) 100 MG tablet Take 100 mg by mouth daily.  travoprost, benzalkonium, (TRAVATAN) 0.004 % ophthalmic solution Place 1 drop into both eyes at bedtime.  triamcinolone cream (KENALOG) 0.1 % Apply 1 application topically 2 (two) times daily.  atorvastatin (LIPITOR) 20 MG tablet Take 20 mg by mouth daily.      Results for orders placed or performed during the hospital encounter of 02/14/15 (from the past 48 hour(s))    Basic metabolic panel     Status: Abnormal   Collection Time: 02/14/15  9:51 AM  Result Value Ref Range   Sodium 136 135 - 145 mmol/L   Potassium 3.8 3.5 - 5.1 mmol/L    Comment: SPECIMEN HEMOLYZED. HEMOLYSIS MAY AFFECT INTEGRITY OF RESULTS.   Chloride 101 101 - 111 mmol/L   CO2 23 22 - 32 mmol/L   Glucose, Bld 226 (H) 65 - 99 mg/dL   BUN 22 (H) 6 - 20 mg/dL   Creatinine, Ser 1.04 (H) 0.44 - 1.00 mg/dL   Calcium 9.5 8.9 - 10.3 mg/dL   GFR calc non Af Amer 52 (L) >60 mL/min   GFR calc Af Amer >60 >60 mL/min    Comment: (NOTE) The eGFR has been calculated using the CKD EPI equation. This calculation has not been validated in all clinical situations. eGFR's persistently <60 mL/min signify possible Chronic Kidney Disease.    Anion gap 12 5 - 15  CBC     Status: Abnormal   Collection Time: 02/14/15  9:51 AM  Result Value Ref Range   WBC 9.5 4.0 - 10.5 K/uL   RBC 5.14 (H) 3.87 - 5.11 MIL/uL   Hemoglobin 14.9 12.0 - 15.0 g/dL   HCT 42.7 36.0 - 46.0 %   MCV 83.1 78.0 - 100.0 fL   MCH 29.0 26.0 - 34.0 pg   MCHC 34.9 30.0 - 36.0 g/dL   RDW 12.8 11.5 - 15.5 %   Platelets 305 150 - 400 K/uL  Magnesium     Status: None   Collection Time: 02/14/15  9:51 AM  Result Value Ref Range   Magnesium 1.8 1.7 - 2.4 mg/dL  Lipase, blood     Status: None   Collection Time: 02/14/15  9:51 AM  Result Value Ref Range   Lipase 26 22 - 51 U/L  Hepatic function panel     Status: Abnormal   Collection Time: 02/14/15  9:51 AM  Result Value Ref Range   Total Protein 7.1 6.5 - 8.1 g/dL   Albumin 3.9 3.5 - 5.0 g/dL   AST 26 15 - 41 U/L   ALT 13 (L) 14 - 54 U/L   Alkaline Phosphatase 73 38 - 126 U/L   Total Bilirubin 1.1 0.3 - 1.2 mg/dL   Bilirubin, Direct 0.3 0.1 - 0.5 mg/dL   Indirect Bilirubin 0.8 0.3 - 0.9 mg/dL  I-stat troponin, ED     Status: None   Collection Time: 02/14/15 10:07 AM  Result Value Ref Range   Troponin i, poc 0.00 0.00 - 0.08 ng/mL   Comment 3            Comment: Due to the  release kinetics of cTnI, a negative result within the first hours of the onset of symptoms does not rule out myocardial infarction with certainty. If  myocardial infarction is still suspected, repeat the test at appropriate intervals.   POC occult blood, ED Provider will collect     Status: None   Collection Time: 02/14/15 11:19 AM  Result Value Ref Range   Fecal Occult Bld NEGATIVE NEGATIVE    Dg Chest 2 View  02/14/2015   CLINICAL DATA:  Vomiting, near syncope over past several days. Chest pain.  EXAM: CHEST  2 VIEW  COMPARISON:  01/12/2008  FINDINGS: The heart size and mediastinal contours are within normal limits. Both lungs are clear. The visualized skeletal structures are unremarkable.  IMPRESSION: No active cardiopulmonary disease.   Electronically Signed   By: Rolm Baptise M.D.   On: 02/14/2015 10:36    Review of Systems  Constitutional: Positive for diaphoresis (may just be from the heat outside). Negative for fever.  HENT: Negative for congestion and sore throat.   Respiratory: Positive for shortness of breath (With exertion). Negative for cough and wheezing.   Cardiovascular: Positive for chest pain ("Pressure") and palpitations. Negative for orthopnea, leg swelling and PND.  Gastrointestinal: Positive for nausea, vomiting, abdominal pain and blood in stool (One time last week). Negative for melena.  Musculoskeletal: Positive for back pain.  Neurological: Positive for dizziness.  All other systems reviewed and are negative.  Blood pressure 115/99, pulse 68, temperature 98.1 F (36.7 C), temperature source Oral, resp. rate 18, height '4\' 10"'  (1.473 m), weight 174 lb (78.926 kg), SpO2 99 %. Physical Exam  Nursing note and vitals reviewed. Constitutional: She is oriented to person, place, and time. She appears well-developed. No distress.  Obesity  HENT:  Head: Normocephalic and atraumatic.  Mouth/Throat: No oropharyngeal exudate.  Eyes: EOM are normal. Pupils are equal,  round, and reactive to light. No scleral icterus.  Neck: Normal range of motion. Neck supple.  Cardiovascular: Normal rate and regular rhythm.   Murmur heard.  Systolic murmur is present with a grade of 1/6  Pulses:      Radial pulses are 2+ on the right side, and 2+ on the left side.       Dorsalis pedis pulses are 2+ on the right side, and 2+ on the left side.  Respiratory: Effort normal and breath sounds normal. She has no wheezes. She has no rales.  GI: Soft. Bowel sounds are normal. She exhibits no distension. There is tenderness (mild left lower quadrant).  Musculoskeletal: She exhibits no edema.  Lymphadenopathy:    She has no cervical adenopathy.  Neurological: She is alert and oriented to person, place, and time.  Skin: Skin is warm and dry.  Psychiatric: She has a normal mood and affect.    Assessment/Plan  Active Problems:   DM2 (diabetes mellitus, type 2)   HTN (hypertension)   Hypercholesterolemia   Chest pain   Presyncope   History of PAF   Obesity   History of severe GERD   Patient continues to have mild intensity chest pressure. EKG shows no acute changes and troponin is negative so far.  Her blood pressure has improved considerably after 10 mg of IV hydralazine however, the chest pain has not decreased. This could be related to hypertensive urgency, [place] or perhaps GERD.  It has been several years since she's had a stress test.  She has family history of CAD.  I recommend [facility].  An echo is pending.  Monitor on telemetry for arrhythmia which may explain her dizziness.   Tarri Fuller, [place] 02/14/2015, 3:46 PM   I have  seen and examined the patient along with Tarri Fuller, PA.  I have reviewed the chart, notes and new data.  I agree with PA's note.  Key new complaints: continues to have constant, unvarying 2-3/10 retrosternal chest pressure, independent of meals or exercise Key examination changes: normal exam Key new findings / data: low risk  ECG and enzymes.  PLAN: Chest pressure with some features suggestive of cardiac etiology, but not typical. Intermediate risk factor profile. If serial cardiac enzymes remain normal, Myoview study in AM (she would like to try treadmill since she walks better after her bilateral knee replacements, but asked her not to consume caffeine in case we need to switch to pharmacological test).  Sanda Klein, MD, [place] [phone] 02/14/2015, 5:22 PM

## 2015-02-14 NOTE — Telephone Encounter (Signed)
Mrs.Fileds is calling because she past out on Friday and Saturday also throwing up . Not feeling well .Marland Kitchen Please call   Thanks

## 2015-02-14 NOTE — H&P (Signed)
[facility] History and Physical  Christina Bishop UVO:[id number] DOB: [birth year] DOA: 02/14/2015  Referring physician:  PCP: Thressa Sheller, MD  Specialists:   Chief Complaint: chest pains, dizziness   HPI: Christina Bishop is a 74 y.o. female with PMH of HTN, DM, PAF on Eliques presented with intermittent episodes of dizziness, associated with substernal chest tightness, diaphoresis for 3 days. No syncopal episodes, no focal neuro weakness. She denies acute chest pain, but describes substernal chest tightness, diaphoresis, dyspnea on exertion, nausea. She denies cough, no fever. No abdominal pains, but reports chronic GERD  Review of Systems: The patient denies anorexia, fever, weight loss,, vision loss, decreased hearing, hoarseness, chest pain, syncope, dyspnea on exertion, peripheral edema, balance deficits, hemoptysis, abdominal pain, melena, hematochezia, severe indigestion/heartburn, hematuria, incontinence, genital sores, muscle weakness, suspicious skin lesions, transient blindness, difficulty walking, depression, unusual weight change, abnormal bleeding, enlarged lymph nodes, angioedema, and breast masses.    Past Medical History  Diagnosis Date  . DM2 (diabetes mellitus, type 2)     Uncontrolled  . HTN (hypertension)   . Psoriasis   . Psoriatic arthritis   . H/O: hysterectomy   . Depression   . SOB (shortness of breath) on exertion   . Glaucoma   . Hypercholesterolemia   . Obesity   . Paroxysmal atrial fibrillation    Past Surgical History  Procedure Laterality Date  . Total knee arthroplasty    . Cardiac catheterization     Social History:  reports that she has never smoked. She does not have any smokeless tobacco history on file. She reports that she does not drink alcohol or use illicit drugs. Home  where does patient live--home, ALF, SNF? and with whom if at home? Yes;  Can patient participate in ADLs?  Allergies  Allergen Reactions  . Ace Inhibitors      dyspnea  . Codeine Itching  . Metoprolol Other (See Comments)    Hallucinations    Family History  Problem Relation Age of Onset  . Stroke Mother   . Heart attack Father     (be sure to complete)  Prior to Admission medications   Medication Sig Start Date End Date Taking? Authorizing Provider  acetaminophen (TYLENOL) 325 MG tablet Take 2 tablets (650 mg total) by mouth every 4 (four) hours as needed for headache or mild pain. 03/27/14   Erlene Quan, PA-C  amLODipine (NORVASC) 10 MG tablet Take 10 mg by mouth daily. 09/17/14   Historical Provider, MD  apixaban (ELIQUIS) 5 MG TABS tablet Take 1 tablet (5 mg total) by mouth 2 (two) times daily. 10/04/14   Peter M Martinique, MD  atorvastatin (LIPITOR) 20 MG tablet Take 20 mg by mouth daily.  02/06/12   Historical Provider, MD  Brimonidine Tartrate-Timolol (COMBIGAN OP) Place 1 drop into both eyes 2 (two) times daily.     Historical Provider, MD  cloNIDine (CATAPRES) 0.1 MG tablet Take 0.1 mg by mouth at bedtime.    Historical Provider, MD  furosemide (LASIX) 10 MG/ML solution Take 20 mg by mouth daily.    Historical Provider, MD  furosemide (LASIX) 20 MG tablet Take 1 tablet (20 mg total) by mouth daily. 01/19/15   Peter M Martinique, MD  glipiZIDE (GLUCOTROL) 10 MG tablet Take 10 mg by mouth 2 (two) times daily before a meal.    Historical Provider, MD  HUMIRA 40 MG/0.8ML PSKT Inject 40 mg as directed. Every other week 09/10/14   Historical Provider, MD  insulin detemir (  LEVEMIR) 100 unit/ml SOLN Inject 20 Units into the skin at bedtime.     Historical Provider, MD  losartan-hydrochlorothiazide (HYZAAR) 50-12.5 MG per tablet Take 1 tablet by mouth daily. 02/13/13   Tanda Rockers, MD  NEXIUM 40 MG capsule Take 40 mg by mouth daily.  01/29/12   Historical Provider, MD  predniSONE (DELTASONE) 5 MG tablet Take 5 mg by mouth as needed. 1 1/2 tablets as needed 07/27/14   Historical Provider, MD  sertraline (ZOLOFT) 100 MG tablet Take 100 mg by mouth daily.   02/24/12   Historical Provider, MD  sitaGLIPtin (JANUVIA) 100 MG tablet Take 100 mg by mouth daily.    Historical Provider, MD  travoprost, benzalkonium, (TRAVATAN) 0.004 % ophthalmic solution Place 1 drop into both eyes at bedtime.    Historical Provider, MD   Physical Exam: Filed Vitals:   02/14/15 1230  BP: 200/78  Pulse: 62  Temp:   Resp: 18     General:  Alert, no distress   Eyes: eom-i, perrla   ENT: no oral ulcers   Neck: supple, no JVD  Cardiovascular: s1,s2 rrr  Respiratory: CTA BL  Abdomen: soft, nt,n d  Skin: no rash   Musculoskeletal: no pitting edema  Psychiatric: no hallucinations   Neurologic: CN 2-12 intact. Motor 5/5 BL  Labs on Admission:  Basic Metabolic Panel:  Recent Labs Lab 02/14/15 0951  NA 136  K 3.8  CL 101  CO2 23  GLUCOSE 226*  BUN 22*  CREATININE 1.04*  CALCIUM 9.5  MG 1.8   Liver Function Tests:  Recent Labs Lab 02/14/15 0951  AST 26  ALT 13*  ALKPHOS 73  BILITOT 1.1  PROT 7.1  ALBUMIN 3.9    Recent Labs Lab 02/14/15 0951  LIPASE 26   No results for input(s): AMMONIA in the last 168 hours. CBC:  Recent Labs Lab 02/14/15 0951  WBC 9.5  HGB 14.9  HCT 42.7  MCV 83.1  PLT 305   Cardiac Enzymes: No results for input(s): CKTOTAL, CKMB, CKMBINDEX, TROPONINI in the last 168 hours.  BNP (last 3 results) No results for input(s): BNP in the last 8760 hours.  ProBNP (last 3 results) No results for input(s): PROBNP in the last 8760 hours.  CBG: No results for input(s): GLUCAP in the last 168 hours.  Radiological Exams on Admission: Dg Chest 2 View  02/14/2015   CLINICAL DATA:  Vomiting, near syncope over past several days. Chest pain.  EXAM: CHEST  2 VIEW  COMPARISON:  01/12/2008  FINDINGS: The heart size and mediastinal contours are within normal limits. Both lungs are clear. The visualized skeletal structures are unremarkable.  IMPRESSION: No active cardiopulmonary disease.   Electronically Signed   By:  Rolm Baptise M.D.   On: 02/14/2015 10:36    EKG: Independently reviewed.   Assessment/Plan Active Problems:   Chest pain  74 y/o female with PMH of HTN, DM, PAF on Eliques presented with intermittent episodes of dizziness, associated with substernal chest tightness, diaphoresis for 3 days  1. Chest pains/tightness/DOE. Atypical presentation. Initial trop/ECG: no acute changes. But she has significant risk factors for CAD -we will admit/tele/obs. Monitor serial trop/ECG. Cont ASA, not on BB, cont statins. Obtain echo, [facility] for stresstest eval   2. Presyncope. Of unclear etiology. Neuro exam is non focal. F/u echo. Control BP. Monitor on tele 3. HTN. Uncontrolled in ED. -we will increase clonidine to BID. Cont amlodipine, lasix, hyzaar. Prn hydralazine. Titrate as needed  4. PAF on Eliques 5. DM. Check ha1c. Cont insulin regimen. Monitor   Cardiology.  if consultant consulted, please document name and whether formally or informally consulted  Code Status: full (must indicate code status--if unknown or must be presumed, indicate so) Family Communication: d/w patient (indicate person spoken with, if applicable, with phone number if by telephone) Disposition Plan: home 24-48 hrs, pend work up (indicate anticipated LOS)  Time spent: >45 minutes   Kinnie Feil [facility] Pager [phone]  If 7PM-7AM, please contact night-coverage [email or web address] Password [facility] 02/14/2015, 12:45 PM

## 2015-02-14 NOTE — ED Notes (Signed)
Pt. Presents with complaint of mid chest pressure starting on Friday. States she has been feeling lightheaded and weak since Friday accompanied by N/V. Pt. Has not vomited today. Pt. States she has productive cough, states she felt febrile last night. No fever on assessment today.

## 2015-02-14 NOTE — Telephone Encounter (Signed)
Agree with recommendation  Sina Lucchesi Martinique MD, [facility]

## 2015-02-14 NOTE — Telephone Encounter (Signed)
Feeling like she is going to pass out followed by nausea vomiting and sweating  States she passed out twice, Friday and Saturday.  Can feel her heart is irregular but does not know how to take her pulse  Blood sugar this morning = 178  C/O suffering with severe acid reflux  Advised patient to go to the ED for evaluation.

## 2015-02-14 NOTE — ED Notes (Signed)
MD at bedside. 

## 2015-02-15 ENCOUNTER — Observation Stay (HOSPITAL_COMMUNITY): Payer: Medicare Other

## 2015-02-15 ENCOUNTER — Observation Stay (HOSPITAL_BASED_OUTPATIENT_CLINIC_OR_DEPARTMENT_OTHER): Payer: Medicare Other

## 2015-02-15 DIAGNOSIS — R072 Precordial pain: Secondary | ICD-10-CM | POA: Diagnosis not present

## 2015-02-15 DIAGNOSIS — E780 Pure hypercholesterolemia: Secondary | ICD-10-CM | POA: Diagnosis not present

## 2015-02-15 DIAGNOSIS — E1165 Type 2 diabetes mellitus with hyperglycemia: Secondary | ICD-10-CM | POA: Diagnosis not present

## 2015-02-15 DIAGNOSIS — R55 Syncope and collapse: Secondary | ICD-10-CM | POA: Diagnosis not present

## 2015-02-15 DIAGNOSIS — I10 Essential (primary) hypertension: Secondary | ICD-10-CM | POA: Diagnosis not present

## 2015-02-15 DIAGNOSIS — R079 Chest pain, unspecified: Secondary | ICD-10-CM | POA: Diagnosis not present

## 2015-02-15 LAB — TROPONIN I
Troponin I: <0.03 ng/mL (ref <0.031)
Troponin I: <0.03 ng/mL (ref <0.031)

## 2015-02-15 LAB — GLUCOSE, CAPILLARY
Glucose-Capillary: 90 mg/dL (ref 65–99)
Glucose-Capillary: 168 mg/dL — ABNORMAL HIGH (ref 65–99)
Glucose-Capillary: 197 mg/dL — ABNORMAL HIGH (ref 65–99)
Glucose-Capillary: 175 mg/dL — ABNORMAL HIGH (ref 65–99)

## 2015-02-15 LAB — HEMOGLOBIN A1C
Hgb A1c MFr Bld: 8.4 % — ABNORMAL HIGH (ref 4.8–5.6)
Mean Plasma Glucose: 194 mg/dL

## 2015-02-15 MED ORDER — REGADENOSON 0.4 MG/5ML IV SOLN
INTRAVENOUS | Status: AC
  Filled 2015-02-15: qty 5

## 2015-02-15 MED ORDER — REGADENOSON 0.4 MG/5ML IV SOLN
0.4000 mg | Freq: Once | INTRAVENOUS | Status: DC
  Filled 2015-02-15: qty 5

## 2015-02-15 MED ORDER — TECHNETIUM TC 99M SESTAMIBI - CARDIOLITE
10.0000 | Freq: Once | INTRAVENOUS | Status: AC | PRN
  Administered 2015-02-15: 10 via INTRAVENOUS

## 2015-02-15 MED ORDER — TECHNETIUM TC 99M SESTAMIBI - CARDIOLITE
30.0000 | Freq: Once | INTRAVENOUS | Status: AC | PRN
  Administered 2015-02-15: 13:00:00 30 via INTRAVENOUS

## 2015-02-15 NOTE — Discharge Instructions (Addendum)
Follow with Primary MD Thressa Sheller, MD in 7 days   Get CBC, CMP, 2 view Chest X ray checked  by Primary MD next visit.    Activity: As tolerated with Full fall precautions use walker/cane & assistance as needed   Disposition Home     Diet: Heart Healthy Low Carb.  For Heart failure patients - Check your Weight same time everyday, if you gain over 2 pounds, or you develop in leg swelling, experience more shortness of breath or chest pain, call your Primary MD immediately. Follow Cardiac Low Salt Diet and 1.5 lit/day fluid restriction.   On your next visit with your primary care physician please Get Medicines reviewed and adjusted.   Please request your Prim.MD to go over all Hospital Tests and Procedure/Radiological results at the follow up, please get all Hospital records sent to your Prim MD by signing hospital release before you go home.   If you experience worsening of your admission symptoms, develop shortness of breath, life threatening emergency, suicidal or homicidal thoughts you must seek medical attention immediately by calling 911 or calling your MD immediately  if symptoms less severe.  You Must read complete instructions/literature along with all the possible adverse reactions/side effects for all the Medicines you take and that have been prescribed to you. Take any new Medicines after you have completely understood and accpet all the possible adverse reactions/side effects.   Do not drive, operating heavy machinery, perform activities at heights, swimming or participation in water activities or provide baby sitting services until you have seen by yopur Cardiologist and recommended Neurologist and advised to do so again.  Do not drive when taking Pain medications.    Do not take more than prescribed Pain, Sleep and Anxiety Medications  Special Instructions: If you have smoked or chewed Tobacco  in the last 2 yrs please stop smoking, stop any regular Alcohol  and or  any Recreational drug use.  Wear Seat belts while driving.   Please note  You were cared for by a hospitalist during your hospital stay. If you have any questions about your discharge medications or the care you received while you were in the hospital after you are discharged, you can call the unit and asked to speak with the hospitalist on call if the hospitalist that took care of you is not available. Once you are discharged, your primary care physician will handle any further medical issues. Please note that NO REFILLS for any discharge medications will be authorized once you are discharged, as it is imperative that you return to your primary care physician (or establish a relationship with a primary care physician if you do not have one) for your aftercare needs so that they can reassess your need for medications and monitor your lab values.    Information on my medicine - ELIQUIS (apixaban)  This medication education was reviewed with me or my healthcare representative as part of my discharge preparation.    Why was Eliquis prescribed for you? Eliquis was prescribed for you to reduce the risk of a blood clot forming that can cause a stroke if you have a medical condition called atrial fibrillation (a type of irregular heartbeat).  What do You need to know about Eliquis ? Take your Eliquis  5mg  TWICE DAILY - one tablet in the morning and one tablet in the evening with or without food. If you have difficulty swallowing the tablet whole please discuss with your pharmacist how to take the medication safely.  Take Eliquis exactly as prescribed by your doctor and DO NOT stop taking Eliquis without talking to the doctor who prescribed the medication.  Stopping may increase your risk of developing a stroke.  Refill your prescription before you run out.  After discharge, you should have regular check-up appointments with your healthcare provider that is prescribing your Eliquis.  In the  future your dose may need to be changed if your kidney function or weight changes by a significant amount or as you get older.  What do you do if you miss a dose? If you miss a dose, take it as soon as you remember on the same day and resume taking twice daily.  Do not take more than one dose of ELIQUIS at the same time to make up a missed dose.  Important Safety Information A possible side effect of Eliquis is bleeding. You should call your healthcare provider right away if you experience any of the following: ? Bleeding from an injury or your nose that does not stop. ? Unusual colored urine (red or dark brown) or unusual colored stools (red or black). ? Unusual bruising for unknown reasons. ? A serious fall or if you hit your head (even if there is no bleeding).  Some medicines may interact with Eliquis and might increase your risk of bleeding or clotting while on Eliquis. To help avoid this, consult your healthcare provider or pharmacist prior to using any new prescription or non-prescription medications, including herbals, vitamins, non-steroidal anti-inflammatory drugs (NSAIDs) and supplements.  This website has more information on Eliquis (apixaban): http://www.eliquis.com/eliquis/home

## 2015-02-15 NOTE — Progress Notes (Signed)
Patient Demographics:    Christina Bishop, is a 74 y.o. female, DOB - [birth year], RDE:[id number]  Admit date - 02/14/2015   Admitting Physician Kinnie Feil, MD  Outpatient Primary MD for the patient is Thressa Sheller, MD  LOS - 1   Chief Complaint  Patient presents with  . Chest Pain        Subjective:    Christina Bishop today has, No headache, No chest pain, No abdominal pain - No Nausea, No new weakness tingling or numbness, No Cough - SOB.    Assessment  & Plan :     1. Chest pain with some dyspnea on exertion. He ruled out for MI with negative troponin 3 sets, echocardiogram unremarkable with chronic grade 1 diastolic dysfunction preserved EF of 60% and no wall motion abnormality. Cardiology on board she is to undergo stress test later today. Symptoms have completely resolved. After stress test will increase activity and monitor.  2. Presyncope. With #1 above, stable on telemetry, echo stable, blood pressure stable, not orthostatic, after stress test advance activity and monitor.   3. Essential hypertension. Pressure stable on combination of Norvasc, clonidine, ARB, Lasix along with HCTZ.   4. Paroxysmal atrial fibrillation with [place] VASC 2 Score of > 3 - on Eliquis continue. Cardiology on board. Monitor on telemetry.   5. GERD. On PPI continue.    6. Dyslipidemia. Continue home dose statin.    7. DM 2. Continue present insulin regimen.  Lab Results  Component Value Date   HGBA1C 8.4* 02/14/2015    CBG (last 3)   Recent Labs  02/14/15 2112 02/15/15 0548  GLUCAP 270* 90        Code Status : Full  Family Communication  : none present  Disposition Plan  : Home 1 days  Consults  :  Cards  Procedures  :   TTE  - Left ventricle: The cavity size was normal.  Wall thickness wasnormal. Systolic function was normal. The estimated ejectionfraction was in the range of 55% to 60%. Wall motion was normal;there were no regional wall motion abnormalities. Dopplerparameters are consistent with abnormal left ventricularrelaxation (grade 1 diastolic dysfunction). - Left atrium: The atrium was mildly dilated.  Impressions:  lNormal LV systolic function; grade 1 diastolic dysfunction; mild LAE; trace TR.   DVT Prophylaxis  :  Eliquis  Lab Results  Component Value Date   PLT 305 02/14/2015    Inpatient Medications  Scheduled Meds: . amLODipine  10 mg Oral Daily  . apixaban  5 mg Oral BID  . aspirin EC  325 mg Oral Daily  . atorvastatin  20 mg Oral Daily  . cloNIDine  0.1 mg Oral BID  . furosemide  20 mg Oral Daily  . losartan  50 mg Oral Daily   And  . hydrochlorothiazide  12.5 mg Oral Daily  . insulin aspart  0-9 Units Subcutaneous TID WC  . insulin detemir  20 Units Subcutaneous QHS  . linagliptin  5 mg Oral Daily  . pantoprazole  40 mg Oral Daily  . regadenoson      . regadenoson  0.4 mg Intravenous Once  . sertraline  100 mg Oral Daily  . sodium chloride  3 mL Intravenous Q12H  .  Travoprost (BAK Free)  1 drop Both Eyes QHS   Continuous Infusions:  PRN Meds:.hydrALAZINE  Antibiotics  :     Anti-infectives    None        Objective:   Filed Vitals:   02/15/15 0601 02/15/15 1213 02/15/15 1215 02/15/15 1217  BP: 148/53 103/73 110/82 138/60  Pulse: 62 59 96 93  Temp: 97.8 F (36.6 C)     TempSrc: Oral     Resp: 18     Height:      Weight:      SpO2: 97%       Wt Readings from Last 3 Encounters:  02/15/15 79.833 kg (176 lb)  10/04/14 80.695 kg (177 lb 14.4 oz)  04/19/14 77.565 kg (171 lb)     Intake/Output Summary (Last 24 hours) at 02/15/15 1218 Last data filed at 02/15/15 0816  Gross per 24 hour  Intake    120 ml  Output   1020 ml  Net   -900 ml     Physical Exam  Awake Alert, Oriented X 3, No new F.N  deficits, Normal affect .AT,PERRAL Supple Neck,No JVD, No cervical lymphadenopathy appriciated.  Symmetrical Chest wall movement, Good air movement bilaterally, CTAB RRR,No Gallops,Rubs or new Murmurs, No Parasternal Heave +ve B.Sounds, Abd Soft, No tenderness, No organomegaly appriciated, No rebound - guarding or rigidity. No Cyanosis, Clubbing or edema, No new Rash or bruise       Data Review:   Micro Results No results found for this or any previous visit (from the past 240 hour(s)).  Radiology Reports Dg Chest 2 View  02/14/2015   CLINICAL DATA:  Vomiting, near syncope over past several days. Chest pain.  EXAM: CHEST  2 VIEW  COMPARISON:  01/12/2008  FINDINGS: The heart size and mediastinal contours are within normal limits. Both lungs are clear. The visualized skeletal structures are unremarkable.  IMPRESSION: No active cardiopulmonary disease.   Electronically Signed   By: Rolm Baptise M.D.   On: 02/14/2015 10:36     CBC  Recent Labs Lab 02/14/15 0951  WBC 9.5  HGB 14.9  HCT 42.7  PLT 305  MCV 83.1  MCH 29.0  MCHC 34.9  RDW 12.8    Chemistries   Recent Labs Lab 02/14/15 0951  NA 136  K 3.8  CL 101  CO2 23  GLUCOSE 226*  BUN 22*  CREATININE 1.04*  CALCIUM 9.5  MG 1.8  AST 26  ALT 13*  ALKPHOS 73  BILITOT 1.1   ------------------------------------------------------------------------------------------------------------------ estimated creatinine clearance is 43.6 mL/min (by C-G formula based on Cr of 1.04). ------------------------------------------------------------------------------------------------------------------  Recent Labs  02/14/15 2004  HGBA1C 8.4*   ------------------------------------------------------------------------------------------------------------------ No results for input(s): CHOL, HDL, LDLCALC, TRIG, CHOLHDL, LDLDIRECT in the last 72  hours. ------------------------------------------------------------------------------------------------------------------ No results for input(s): TSH, T4TOTAL, T3FREE, THYROIDAB in the last 72 hours.  Invalid input(s): FREET3 ------------------------------------------------------------------------------------------------------------------ No results for input(s): VITAMINB12, FOLATE, FERRITIN, TIBC, IRON, RETICCTPCT in the last 72 hours.  Coagulation profile No results for input(s): INR, PROTIME in the last 168 hours.  No results for input(s): DDIMER in the last 72 hours.  Cardiac Enzymes  Recent Labs Lab 02/14/15 2004 02/14/15 2327 02/15/15 0526  TROPONINI <0.03 <0.03 <0.03   ------------------------------------------------------------------------------------------------------------------ Invalid input(s): POCBNP   Time Spent in minutes  35    Christina Bishop K M.D on 02/15/2015 at 12:18 PM  Between 7am to 7pm - Pager - [phone]  After 7pm go to [email or web address] - password [email or web address]  [facility] -  Office  [phone]

## 2015-02-15 NOTE — Progress Notes (Signed)
Patient Name: Christina Bishop Date of Encounter: 02/15/2015     Active Problems:   DM2 (diabetes mellitus, type 2)   HTN (hypertension)   Hypercholesterolemia   Chest pain    SUBJECTIVE  No more chest pain or dizziness. Hungry. Awaiting nuclear stress test.   CURRENT MEDS . amLODipine  10 mg Oral Daily  . apixaban  5 mg Oral BID  . aspirin EC  325 mg Oral Daily  . atorvastatin  20 mg Oral Daily  . cloNIDine  0.1 mg Oral BID  . furosemide  20 mg Oral Daily  . losartan  50 mg Oral Daily   And  . hydrochlorothiazide  12.5 mg Oral Daily  . insulin aspart  0-9 Units Subcutaneous TID WC  . insulin detemir  20 Units Subcutaneous QHS  . linagliptin  5 mg Oral Daily  . pantoprazole  40 mg Oral Daily  . sertraline  100 mg Oral Daily  . sodium chloride  3 mL Intravenous Q12H  . Travoprost (BAK Free)  1 drop Both Eyes QHS    OBJECTIVE  Filed Vitals:   02/14/15 2115 02/15/15 0226 02/15/15 0300 02/15/15 0601  BP: 138/57 159/61  148/53  Pulse: 74 70  62  Temp:  97.9 F (36.6 C)  97.8 F (36.6 C)  TempSrc:  Oral  Oral  Resp:  18  18  Height:      Weight:   79.833 kg (176 lb)   SpO2:  98%  97%    Intake/Output Summary (Last 24 hours) at 02/15/15 0939 Last data filed at 02/15/15 0816  Gross per 24 hour  Intake    120 ml  Output   1020 ml  Net   -900 ml   Filed Weights   02/14/15 0939 02/14/15 1754 02/15/15 0300  Weight: 78.926 kg (174 lb) 79.243 kg (174 lb 11.2 oz) 79.833 kg (176 lb)    PHYSICAL EXAM  General: Pleasant, NAD. obese Neuro: Alert and oriented X 3. Moves all extremities spontaneously. Psych: Normal affect. HEENT:  Normal  Neck: Supple without bruits or JVD. Lungs:  Resp regular and unlabored, CTA. Heart: RRR no s3, s4, or murmurs. Abdomen: Soft, non-tender, non-distended, BS + x 4.  Extremities: No clubbing, cyanosis or edema. DP/PT/Radials 2+ and equal bilaterally.  Accessory Clinical Findings  CBC  Recent Labs  02/14/15 0951  WBC 9.5   HGB 14.9  HCT 42.7  MCV 83.1  PLT 891   Basic Metabolic Panel  Recent Labs  02/14/15 0951  NA 136  K 3.8  CL 101  CO2 23  GLUCOSE 226*  BUN 22*  CREATININE 1.04*  CALCIUM 9.5  MG 1.8   Liver Function Tests  Recent Labs  02/14/15 0951  AST 26  ALT 13*  ALKPHOS 73  BILITOT 1.1  PROT 7.1  ALBUMIN 3.9    Recent Labs  02/14/15 0951  LIPASE 26   Cardiac Enzymes  Recent Labs  02/14/15 2004 02/14/15 2327 02/15/15 0526  TROPONINI <0.03 <0.03 <0.03   Hemoglobin A1C  Recent Labs  02/14/15 2004  HGBA1C 8.4*    TELE  NSR with some sinus brady  Radiology/Studies  Dg Chest 2 View  02/14/2015   CLINICAL DATA:  Vomiting, near syncope over past several days. Chest pain.  EXAM: CHEST  2 VIEW  COMPARISON:  01/12/2008  FINDINGS: The heart size and mediastinal contours are within normal limits. Both lungs are clear. The visualized skeletal structures are unremarkable.  IMPRESSION: No  active cardiopulmonary disease.   Electronically Signed   By: Rolm Baptise M.D.   On: 02/14/2015 10:36    ASSESSMENT AND PLAN Christina Bishop is a 74 y.o. female with a history of diabetes mellitus, hypertension, PAF on eliquis, psoriatic arthritis, depression, hyperlipidemia and obesity who was admitted to [facility] on 02/14/15 for chest pain and dizziness.   Chest pain- now resolved.  -- Troponin neg x 3 and ECG with no acute ST or TW changes -- Treadmill Cardiolite today. An echo is pending.If her NST and ECHO are unremarkable she may be able to be discharged home.  -- Continue ASA and statin  Dizziness and recent syncopal episodes-  telemetry with no arrhythmia which may explain her dizziness. Consider a 30 day heart monitor at discharge      DM- HgA1c 8.4. She will need better diabetes control. Per IM  PAF- she is maintaining NSR currently.  -- Continue Eliquis  HTN. Uncontrolled in ED and improved after IV hydralazine.  -- Clonidine increased from 0.1mg  qd to BID. Cont  amlodipine 10mg , lasix 20mg , hyzaar 50-12.5mg . Prn hydralazine.   Judy Pimple PA-C  Pager [phone]  Attending Note:   The patient was seen and examined.  Agree with assessment and plan as noted above.  Changes made to the above note as needed.  Pt is doing well justgot back from nuc lab. Awaiting results  May be able to go home today if nuc is negative  Thayer Headings, Brooke Bonito., MD, [facility] 02/15/2015, 1:41 PM [address]  [place] Pager [phone]

## 2015-02-15 NOTE — Progress Notes (Signed)
  RD consulted for diet education.   Lab Results  Component Value Date   HGBA1C 8.4* 02/14/2015   RD attempted to visit patient 3 times today- pt unavailable at all attempts.   RD provided "Type 2 Diabetes Nutrition Therapy" handout from the [employer]; left at pt bedside. RD contact information provided. Will re-attempt to meet with patient at earliest convenience.    Scarlette Ar RD, LDN Inpatient Clinical Dietitian Pager: [phone] After Hours Pager: [phone]

## 2015-02-16 ENCOUNTER — Observation Stay (HOSPITAL_COMMUNITY): Payer: Medicare Other

## 2015-02-16 DIAGNOSIS — R55 Syncope and collapse: Secondary | ICD-10-CM

## 2015-02-16 DIAGNOSIS — E780 Pure hypercholesterolemia: Secondary | ICD-10-CM | POA: Diagnosis not present

## 2015-02-16 DIAGNOSIS — R072 Precordial pain: Secondary | ICD-10-CM | POA: Diagnosis not present

## 2015-02-16 DIAGNOSIS — I10 Essential (primary) hypertension: Secondary | ICD-10-CM | POA: Diagnosis not present

## 2015-02-16 DIAGNOSIS — E1165 Type 2 diabetes mellitus with hyperglycemia: Secondary | ICD-10-CM | POA: Diagnosis not present

## 2015-02-16 LAB — GLUCOSE, CAPILLARY
Glucose-Capillary: 118 mg/dL — ABNORMAL HIGH (ref 65–99)
Glucose-Capillary: 150 mg/dL — ABNORMAL HIGH (ref 65–99)
Glucose-Capillary: 168 mg/dL — ABNORMAL HIGH (ref 65–99)
Glucose-Capillary: 101 mg/dL — ABNORMAL HIGH (ref 65–99)

## 2015-02-16 NOTE — Progress Notes (Signed)
Patient Demographics:    Christina Bishop, is a 74 y.o. female, DOB - [birth year], EXN:[id number]  Admit date - 02/14/2015   Admitting Physician Kinnie Feil, MD  Outpatient Primary MD for the patient is Thressa Sheller, MD  LOS - 2   Chief Complaint  Patient presents with  . Chest Pain        Subjective:    Christina Bishop today has, No headache, No chest pain, No abdominal pain - No Nausea, No new weakness tingling or numbness, No Cough - SOB.    Assessment  & Plan :     1. Chest pain with some dyspnea on exertion. He ruled out for MI with negative troponin 3 sets, echocardiogram unremarkable with chronic grade 1 diastolic dysfunction preserved EF of 60% and no wall motion abnormality. Stress test unremarkable. Cardiology on board.  2. Syncope. She does give history of bowel incontinence one year ago, echo and stress test unremarkable. Will check MRI and EEG due to incontinence history, we'll request outpatient neuro follow-up along with 30 day event monitor upon discharge if workup inconclusive.   3. Essential hypertension. Pressure stable on combination of Norvasc, clonidine, ARB, Lasix along with HCTZ.   4. Paroxysmal atrial fibrillation with [place] VASC 2 Score of > 3 - on Eliquis continue. Cardiology on board. Monitor on telemetry.   5. GERD. On PPI continue.    6. Dyslipidemia. Continue home dose statin.    7. DM 2. Continue present insulin regimen.  Lab Results  Component Value Date   HGBA1C 8.4* 02/14/2015    CBG (last 3)   Recent Labs  02/15/15 1703 02/15/15 2118 02/16/15 0547  GLUCAP 197* 175* 118*        Code Status : Full  Family Communication  : none present  Disposition Plan  : Home 1 days  Consults  :  Cards  Procedures  :   TTE  - Left  ventricle: The cavity size was normal. Wall thickness wasnormal. Systolic function was normal. The estimated ejectionfraction was in the range of 55% to 60%. Wall motion was normal;there were no regional wall motion abnormalities. Dopplerparameters are consistent with abnormal left ventricularrelaxation (grade 1 diastolic dysfunction). - Left atrium: The atrium was mildly dilated.  Impressions:  lNormal LV systolic function; grade 1 diastolic dysfunction; mild LAE; trace TR.  Stress test. No reversible ischemia  EEG  MRI brain   DVT Prophylaxis  :  Eliquis  Lab Results  Component Value Date   PLT 305 02/14/2015    Inpatient Medications  Scheduled Meds: . amLODipine  10 mg Oral Daily  . apixaban  5 mg Oral BID  . aspirin EC  325 mg Oral Daily  . atorvastatin  20 mg Oral Daily  . cloNIDine  0.1 mg Oral BID  . furosemide  20 mg Oral Daily  . losartan  50 mg Oral Daily   And  . hydrochlorothiazide  12.5 mg Oral Daily  . insulin aspart  0-9 Units Subcutaneous TID WC  . insulin detemir  20 Units Subcutaneous QHS  . linagliptin  5 mg Oral Daily  . pantoprazole  40 mg Oral Daily  . regadenoson  0.4 mg Intravenous Once  . sertraline  100 mg Oral Daily  .  sodium chloride  3 mL Intravenous Q12H  . Travoprost (BAK Free)  1 drop Both Eyes QHS   Continuous Infusions:  PRN Meds:.hydrALAZINE  Antibiotics  :     Anti-infectives    None        Objective:   Filed Vitals:   02/15/15 1700 02/15/15 2120 02/16/15 0405 02/16/15 0513  BP: 188/47 121/62  133/52  Pulse: 64 70  52  Temp: 98.4 F (36.9 C) 98.6 F (37 C)  98.1 F (36.7 C)  TempSrc: Oral Oral  Oral  Resp: 18 18  19   Height:      Weight:   78.608 kg (173 lb 4.8 oz)   SpO2: 99% 98%  98%    Wt Readings from Last 3 Encounters:  02/16/15 78.608 kg (173 lb 4.8 oz)  10/04/14 80.695 kg (177 lb 14.4 oz)  04/19/14 77.565 kg (171 lb)     Intake/Output Summary (Last 24 hours) at 02/16/15 1023 Last data filed at  02/16/15 0811  Gross per 24 hour  Intake    960 ml  Output   1200 ml  Net   -240 ml     Physical Exam  Awake Alert, Oriented X 3, No new F.N deficits, Normal affect [place].AT,PERRAL Supple Neck,No JVD, No cervical lymphadenopathy appriciated.  Symmetrical Chest wall movement, Good air movement bilaterally, CTAB RRR,No Gallops,Rubs or new Murmurs, No Parasternal Heave +ve B.Sounds, Abd Soft, No tenderness, No organomegaly appriciated, No rebound - guarding or rigidity. No Cyanosis, Clubbing or edema, No new Rash or bruise       Data Review:   Micro Results No results found for this or any previous visit (from the past 240 hour(s)).  Radiology Reports Dg Chest 2 View  02/14/2015   CLINICAL DATA:  Vomiting, near syncope over past several days. Chest pain.  EXAM: CHEST  2 VIEW  COMPARISON:  01/12/2008  FINDINGS: The heart size and mediastinal contours are within normal limits. Both lungs are clear. The visualized skeletal structures are unremarkable.  IMPRESSION: No active cardiopulmonary disease.   Electronically Signed   By: Rolm Baptise M.D.   On: 02/14/2015 10:36   Nm Myocar Multi W/spect W/wall Motion / Ef  02/15/2015   CLINICAL DATA:  74 year old with chest pain.  EXAM: MYOCARDIAL IMAGING WITH SPECT (REST AND PHARMACOLOGIC-STRESS)  GATED LEFT VENTRICULAR WALL MOTION STUDY  LEFT VENTRICULAR EJECTION FRACTION  TECHNIQUE: Standard myocardial SPECT imaging was performed after resting intravenous injection of 10 mCi Tc-41m sestamibi. Subsequently, intravenous infusion of Lexiscan was performed under the supervision of the Cardiology staff. At peak effect of the drug, 30 mCi Tc-64m sestamibi was injected intravenously and standard myocardial SPECT imaging was performed. Quantitative gated imaging was also performed to evaluate left ventricular wall motion, and estimate left ventricular ejection fraction.  COMPARISON:  None.  FINDINGS: Perfusion: No decreased activity in the left ventricle on  stress imaging to suggest reversible ischemia or infarction.  Wall Motion: Normal left ventricular wall motion. No left ventricular dilation.  Left Ventricular Ejection Fraction: 68 %  End diastolic volume 49 ml  End systolic volume 16 ml  IMPRESSION: 1. No reversible ischemia or infarction.  2. Normal left ventricular wall motion.  3. Left ventricular ejection fraction is 68%.  4. Low-risk stress test findings*.  *2012 Appropriate Use Criteria for Coronary Revascularization Focused Update: J Am Coll Cardiol. 9201;00(7):121-975. http://content.airportbarriers.com.aspx?articleid=1201161   Electronically Signed   By: Markus Daft M.D.   On: 02/15/2015 15:33     CBC  Recent Labs Lab 02/14/15 0951  WBC 9.5  HGB 14.9  HCT 42.7  PLT 305  MCV 83.1  MCH 29.0  MCHC 34.9  RDW 12.8    Chemistries   Recent Labs Lab 02/14/15 0951  NA 136  K 3.8  CL 101  CO2 23  GLUCOSE 226*  BUN 22*  CREATININE 1.04*  CALCIUM 9.5  MG 1.8  AST 26  ALT 13*  ALKPHOS 73  BILITOT 1.1   ------------------------------------------------------------------------------------------------------------------ estimated creatinine clearance is 43.2 mL/min (by C-G formula based on Cr of 1.04). ------------------------------------------------------------------------------------------------------------------  Recent Labs  02/14/15 2004  HGBA1C 8.4*   ------------------------------------------------------------------------------------------------------------------ No results for input(s): CHOL, HDL, LDLCALC, TRIG, CHOLHDL, LDLDIRECT in the last 72 hours. ------------------------------------------------------------------------------------------------------------------ No results for input(s): TSH, T4TOTAL, T3FREE, THYROIDAB in the last 72 hours.  Invalid input(s): FREET3 ------------------------------------------------------------------------------------------------------------------ No results for input(s):  VITAMINB12, FOLATE, FERRITIN, TIBC, IRON, RETICCTPCT in the last 72 hours.  Coagulation profile No results for input(s): INR, PROTIME in the last 168 hours.  No results for input(s): DDIMER in the last 72 hours.  Cardiac Enzymes  Recent Labs Lab 02/14/15 2004 02/14/15 2327 02/15/15 0526  TROPONINI <0.03 <0.03 <0.03   ------------------------------------------------------------------------------------------------------------------ Invalid input(s): POCBNP   Time Spent in minutes  35    Alece Koppel K M.D on 02/16/2015 at 10:23 AM  Between 7am to 7pm - Pager - [phone]  After 7pm go to [email or web address] - password [facility]  [facility] -  Office  [phone]

## 2015-02-16 NOTE — Procedures (Signed)
ELECTROENCEPHALOGRAM REPORT  Patient: Christina Bishop       Room #: 8A67 EEG No. ID: [id number] Age: 74 y.o.        Sex: female Referring Physician: Ronnie Derby Report Date:  02/16/2015        Interpreting Physician: Anthony Sar  History: [name] is an 74 y.o. female with a history of hypertension, diabetes mellitus and paroxysmal atrial fibrillation who presented with intermittent episodes of dizziness and substernal chest tightness, as well as episodes of incontinence.  Indications for study:  Rule out seizure disorder.  Technique: This is an 18 channel routine scalp EEG performed at the bedside with bipolar and monopolar montages arranged in accordance to the international 10/20 system of electrode placement.   Description: This EEG recording was performed during wakefulness. Predominant activity consisted of 10  Hz alpha rhythm with good attenuation with eye opening. Photic stimulation produced a symmetrical occipital driving response.  Hyperventilation was not performed. No epileptiform discharges were recorded. There was no abnormal slowing of cerebral activity.  Interpretation: This is a normal EEG recording during wakefulness. No evidence of an epileptic disorder was demonstrated. However, a normal EEG recording in and of itself does not rule out seizure disorder.   Rush Farmer M.D. [facility] [phone]

## 2015-02-16 NOTE — Progress Notes (Signed)
EEG completed, results pending. 

## 2015-02-16 NOTE — Progress Notes (Signed)
Patient Name: Christina Bishop Date of Encounter: 02/16/2015  Primary Cardiologist: Dr. Martinique   Active Problems:   DM2 (diabetes mellitus, type 2)   HTN (hypertension)   Hypercholesterolemia   Chest pain    SUBJECTIVE  Denies any SOB or CP last night. Says she occasionally has presyncope where she has to sit down.  CURRENT MEDS . amLODipine  10 mg Oral Daily  . apixaban  5 mg Oral BID  . aspirin EC  325 mg Oral Daily  . atorvastatin  20 mg Oral Daily  . cloNIDine  0.1 mg Oral BID  . furosemide  20 mg Oral Daily  . losartan  50 mg Oral Daily   And  . hydrochlorothiazide  12.5 mg Oral Daily  . insulin aspart  0-9 Units Subcutaneous TID WC  . insulin detemir  20 Units Subcutaneous QHS  . linagliptin  5 mg Oral Daily  . pantoprazole  40 mg Oral Daily  . regadenoson  0.4 mg Intravenous Once  . sertraline  100 mg Oral Daily  . sodium chloride  3 mL Intravenous Q12H  . Travoprost (BAK Free)  1 drop Both Eyes QHS    OBJECTIVE  Filed Vitals:   02/15/15 1700 02/15/15 2120 02/16/15 0405 02/16/15 0513  BP: 188/47 121/62  133/52  Pulse: 64 70  52  Temp: 98.4 F (36.9 C) 98.6 F (37 C)  98.1 F (36.7 C)  TempSrc: Oral Oral  Oral  Resp: 18 18  19   Height:      Weight:   173 lb 4.8 oz (78.608 kg)   SpO2: 99% 98%  98%    Intake/Output Summary (Last 24 hours) at 02/16/15 1147 Last data filed at 02/16/15 0811  Gross per 24 hour  Intake    960 ml  Output   1200 ml  Net   -240 ml   Filed Weights   02/14/15 1754 02/15/15 0300 02/16/15 0405  Weight: 174 lb 11.2 oz (79.243 kg) 176 lb (79.833 kg) 173 lb 4.8 oz (78.608 kg)    PHYSICAL EXAM  General: Pleasant, NAD. Neuro: Alert and oriented X 3. Moves all extremities spontaneously. Psych: Normal affect. HEENT:  Normal  Neck: Supple without bruits or JVD. Lungs:  Resp regular and unlabored, CTA. Heart: RRR no s3, s4, or murmurs. Abdomen: Soft, non-tender, non-distended, BS + x 4.  Extremities: No clubbing, cyanosis  or edema. DP/PT/Radials 2+ and equal bilaterally.  Accessory Clinical Findings  CBC  Recent Labs  02/14/15 0951  WBC 9.5  HGB 14.9  HCT 42.7  MCV 83.1  PLT 809   Basic Metabolic Panel  Recent Labs  02/14/15 0951  NA 136  K 3.8  CL 101  CO2 23  GLUCOSE 226*  BUN 22*  CREATININE 1.04*  CALCIUM 9.5  MG 1.8   Liver Function Tests  Recent Labs  02/14/15 0951  AST 26  ALT 13*  ALKPHOS 73  BILITOT 1.1  PROT 7.1  ALBUMIN 3.9    Recent Labs  02/14/15 0951  LIPASE 26   Cardiac Enzymes  Recent Labs  02/14/15 2004 02/14/15 2327 02/15/15 0526  TROPONINI <0.03 <0.03 <0.03   Hemoglobin A1C  Recent Labs  02/14/15 2004  HGBA1C 8.4*    TELE NSR with HR 40-50s, occasional HR 70s. No recurrent PAF    ECG  No new EKG  Echocardiogram  LV EF: 55% -  60%  ------------------------------------------------------------------- Indications:   R07.9 Chest Pain.  ------------------------------------------------------------------- History:  PMH: Acquired  from the patient and from the patient&'s chart. PMH: Shortness of Breath. Paroxysmal Atrial Fibrillation. Risk factors: Hypertension. Diabetes mellitus. Obese. Hypercholesterolemia.  ------------------------------------------------------------------- Study Conclusions  - Left ventricle: The cavity size was normal. Wall thickness was normal. Systolic function was normal. The estimated ejection fraction was in the range of 55% to 60%. Wall motion was normal; there were no regional wall motion abnormalities. Doppler parameters are consistent with abnormal left ventricular relaxation (grade 1 diastolic dysfunction). - Left atrium: The atrium was mildly dilated.  Impressions:  - lNormal LV systolic function; grade 1 diastolic dysfunction; mild LAE; trace TR.    Radiology/Studies  Dg Chest 2 View  02/14/2015   CLINICAL DATA:  Vomiting, near syncope over past several days. Chest  pain.  EXAM: CHEST  2 VIEW  COMPARISON:  01/12/2008  FINDINGS: The heart size and mediastinal contours are within normal limits. Both lungs are clear. The visualized skeletal structures are unremarkable.  IMPRESSION: No active cardiopulmonary disease.   Electronically Signed   By: Rolm Baptise M.D.   On: 02/14/2015 10:36   Mr Brain Wo Contrast  02/16/2015   CLINICAL DATA:  74 year old female with chest pain, dizziness, syncope. Initial encounter.  EXAM: MRI HEAD WITHOUT CONTRAST  TECHNIQUE: Multiplanar, multiecho pulse sequences of the brain and surrounding structures were obtained without intravenous contrast.  COMPARISON:  None.  FINDINGS: Cerebral volume is within normal limits for age. No restricted diffusion to suggest acute infarction. No midline shift, mass effect, evidence of mass lesion, ventriculomegaly, extra-axial collection or acute intracranial hemorrhage. Cervicomedullary junction and pituitary are within normal limits. Major intracranial vascular flow voids are within normal limits.  Mild for age nonspecific mostly anterior frontal lobe subcortical white matter T2 and FLAIR hyperintensity. Elsewhere Pearline Cables and white matter signal is within normal limits for age. No cortical encephalomalacia or chronic cerebral blood products.  Visible internal auditory structures appear normal. Trace left mastoid fluid and right sphenoid sinus mucosal thickening, other paranasal sinuses and mastoids are clear. Orbit and scalp soft tissues appear normal. Negative visualized cervical spine. Visualized bone marrow signal is within normal limits.  IMPRESSION: No acute intracranial abnormality. Mild for age nonspecific cerebral white matter signal changes, most commonly due to chronic small vessel disease.   Electronically Signed   By: Genevie Ann M.D.   On: 02/16/2015 10:58   Nm Myocar Multi W/spect W/wall Motion / Ef  02/15/2015   CLINICAL DATA:  74 year old with chest pain.  EXAM: MYOCARDIAL IMAGING WITH SPECT (REST  AND PHARMACOLOGIC-STRESS)  GATED LEFT VENTRICULAR WALL MOTION STUDY  LEFT VENTRICULAR EJECTION FRACTION  TECHNIQUE: Standard myocardial SPECT imaging was performed after resting intravenous injection of 10 mCi Tc-71m sestamibi. Subsequently, intravenous infusion of Lexiscan was performed under the supervision of the Cardiology staff. At peak effect of the drug, 30 mCi Tc-30m sestamibi was injected intravenously and standard myocardial SPECT imaging was performed. Quantitative gated imaging was also performed to evaluate left ventricular wall motion, and estimate left ventricular ejection fraction.  COMPARISON:  None.  FINDINGS: Perfusion: No decreased activity in the left ventricle on stress imaging to suggest reversible ischemia or infarction.  Wall Motion: Normal left ventricular wall motion. No left ventricular dilation.  Left Ventricular Ejection Fraction: 68 %  End diastolic volume 49 ml  End systolic volume 16 ml  IMPRESSION: 1. No reversible ischemia or infarction.  2. Normal left ventricular wall motion.  3. Left ventricular ejection fraction is 68%.  4. Low-risk stress test findings*.  *2012 Appropriate Use Criteria  for Coronary Revascularization Focused Update: J Am Coll Cardiol. 4628;63(8):177-116. http://content.airportbarriers.com.aspx?articleid=1201161   Electronically Signed   By: Markus Daft M.D.   On: 02/15/2015 15:33    ASSESSMENT AND PLAN  Patient is a 74 year old female with history of diabetes mellitus, hypertension, PAF,  psoriatic arthritis, depression, hyperlipidemia and obesity presented with chest pain  1. Chest pain, atypical  - myoview 02/15/2015 EF 68%, no reversible ischemia, low risk stress test  - Echo 02/15/2015 EF 57-90%, grade 1 diastolic dysfunction  - continue eliquis, D/C daily ASA. Not on metoprolol despite having h/o PAF due to significant hallucination as SE. HR bradycardia in 40-50s, occasionally in 70s, will not add diltiazem either  - stable for discharge from  cardiac perspective if EEG normal, she has a scheduled followup in 6 wks, it is possible arrhythmia/bradycardia can cause her symptom, if symptom occur again, will consider 30 day event monitor.  2. Dizziness  - MR of brain negative for acute processs.   - EEG pending   3. H/o PAF  4. HTN  5. HLD  6. DM: hgb A1C 8.4, need better control  Weston Brass Woodward Ku Pager: [phone]  Attending Note:   The patient was seen and examined.  Agree with assessment and plan as noted above.  Changes made to the above note as needed.  Stable from a cardiology standpoint.  Will sign off. She will follow up with Dr. Martinique  Tria Noguera J. Dezaree Tracey, Brooke Bonito., MD, [facility] 02/16/2015, 12:23 PM [address]  [place] Pager [phone]

## 2015-02-17 DIAGNOSIS — E1165 Type 2 diabetes mellitus with hyperglycemia: Secondary | ICD-10-CM | POA: Diagnosis not present

## 2015-02-17 DIAGNOSIS — I10 Essential (primary) hypertension: Secondary | ICD-10-CM | POA: Diagnosis not present

## 2015-02-17 DIAGNOSIS — E780 Pure hypercholesterolemia: Secondary | ICD-10-CM | POA: Diagnosis not present

## 2015-02-17 DIAGNOSIS — R55 Syncope and collapse: Secondary | ICD-10-CM | POA: Diagnosis not present

## 2015-02-17 LAB — GLUCOSE, CAPILLARY
Glucose-Capillary: 75 mg/dL (ref 65–99)
Glucose-Capillary: 77 mg/dL (ref 65–99)
Glucose-Capillary: 118 mg/dL — ABNORMAL HIGH (ref 65–99)

## 2015-02-17 NOTE — Progress Notes (Signed)
Pt has been discharged home. Discharge instructions completed with patient. Pt verbalizes understanding.

## 2015-02-17 NOTE — Discharge Summary (Signed)
Christina Bishop, is a 74 y.o. female  DOB 08/06/1940  MRN [id number].  Admission date:  02/14/2015  Admitting Physician  Kinnie Feil, MD  Discharge Date:  02/17/2015   Primary MD  Thressa Sheller, MD  Recommendations for primary care physician for things to follow:   Monitor glycemic control closely, needs outpatient neurology and cardiology follow-up with a 30 day event monitor.   Admission Diagnosis  Near syncope [R55] Chest pain, unspecified chest pain type [R07.9]   Discharge Diagnosis  Near syncope [R55] Chest pain, unspecified chest pain type [R07.9]     Active Problems:   DM2 (diabetes mellitus, type 2)   HTN (hypertension)   Hypercholesterolemia   Chest pain      Past Medical History  Diagnosis Date  . DM2 (diabetes mellitus, type 2)     Uncontrolled  . HTN (hypertension)   . Psoriasis   . Psoriatic arthritis   . H/O: hysterectomy   . Depression   . SOB (shortness of breath) on exertion   . Glaucoma   . Hypercholesterolemia   . Obesity   . Paroxysmal atrial fibrillation   . Arthritis     scoriatic, rheumatoid    Past Surgical History  Procedure Laterality Date  . Total knee arthroplasty    . Cardiac catheterization    . Abdominal hysterectomy    . Appendectomy    . Joint replacement      Both Knees       HPI  from the history and physical done on the day of admission:    Christina Bishop is a 74 y.o. female with PMH of HTN, DM, PAF on Eliques presented with intermittent episodes of dizziness, associated with substernal chest tightness, diaphoresis for 3 days. No syncopal episodes, no focal neuro weakness. She denies acute chest pain, but describes substernal chest tightness, diaphoresis, dyspnea on exertion, nausea. She denies cough, no fever. No abdominal pains, but  reports chronic GERD.    Hospital Course:     1. Chest pain with some dyspnea on exertion. He ruled out for MI with negative troponin 3 sets, echocardiogram unremarkable with chronic grade 1 diastolic dysfunction preserved EF of 60% and no wall motion abnormality. Stress test unremarkable. Cardiology on board.   2. Syncope. She does give history of bowel incontinence one year ago, echo and stress test unremarkable. We checked an MRI and EEG of the brain which were both unremarkable except for nonspecific MRI findings, after talking to her it appears that she has had 3-4 syncopal episodes in the last 2 years, 2 associated with bowel incontinence. Have requested her to follow with Dr. Peter Martinique for a 30 day event monitor, this was also requested with the cardiologist group following in the hospital. Have also requested her to follow with [facility] neurology for one-time neuro Evaluation. MRI and EEG are unremarkable this admission except for nonspecific MRI findings with chronic small vessel changes.    MRI and EEG due to incontinence history, we'll request outpatient neuro follow-up  along with 30 day event monitor upon discharge if workup inconclusive.   3. Essential hypertension. Pressure stable on combination of Norvasc, clonidine, ARB, Lasix along with HCTZ.   4. Paroxysmal atrial fibrillation with [place] VASC 2 Score of > 3 - on Eliquis continue. Cardiology on board. She remained stable on telemetry and was cleared for home discharge with outpatient cardiology follow-up by the cardiologist in the hospital.   5. GERD. On PPI continue.    6. Dyslipidemia. Continue home dose statin.    7. DM 2. Continue present insulin regimen. A1c was 8.2 we'll request PCP to adjust insulin regimen as appropriate.        Discharge Condition: Stable  Follow UP  Follow-up Information    Follow up with Thressa Sheller, MD. Schedule an appointment as soon as possible for a visit in 1 week.    Specialty:  Internal Medicine   Contact information:   [place], [place] [address] [phone]       Follow up with [facility]. Schedule an appointment as soon as possible for a visit in 1 week.   Contact information:   [address] [place] [address]       Follow up with [facility]. Schedule an appointment as soon as possible for a visit in 1 week.   Why:  Recurrent syncope question seizure   Contact information:   [address]     [place] [address] [phone]       Consults obtained - Cards  Diet and Activity recommendation: See Discharge Instructions below  Discharge Instructions       Discharge Instructions    Discharge instructions    Complete by:  As directed   Follow with Primary MD Thressa Sheller, MD in 7 days   Get CBC, CMP, 2 view Chest X ray checked  by Primary MD next visit.    Activity: As tolerated with Full fall precautions use walker/cane & assistance as needed   Disposition Home     Diet: Heart Healthy Low Carb.  For Heart failure patients - Check your Weight same time everyday, if you gain over 2 pounds, or you develop in leg swelling, experience more shortness of breath or chest pain, call your Primary MD immediately. Follow Cardiac Low Salt Diet and 1.5 lit/day fluid restriction.   On your next visit with your primary care physician please Get Medicines reviewed and adjusted.   Please request your Prim.MD to go over all Hospital Tests and Procedure/Radiological results at the follow up, please get all Hospital records sent to your Prim MD by signing hospital release before you go home.   If you experience worsening of your admission symptoms, develop shortness of breath, life threatening emergency, suicidal or homicidal thoughts you must seek medical attention immediately by calling 911 or calling your MD immediately  if  symptoms less severe.  You Must read complete instructions/literature along with all the possible adverse reactions/side effects for all the Medicines you take and that have been prescribed to you. Take any new Medicines after you have completely understood and accpet all the possible adverse reactions/side effects.   Do not drive, operating heavy machinery, perform activities at heights, swimming or participation in water activities or provide baby sitting services until you have seen by yopur Cardiologist and recommended Neurologist and advised to do so again.  Do not drive when taking Pain medications.    Do not  take more than prescribed Pain, Sleep and Anxiety Medications  Special Instructions: If you have smoked or chewed Tobacco  in the last 2 yrs please stop smoking, stop any regular Alcohol  and or any Recreational drug use.  Wear Seat belts while driving.   Please note  You were cared for by a hospitalist during your hospital stay. If you have any questions about your discharge medications or the care you received while you were in the hospital after you are discharged, you can call the unit and asked to speak with the hospitalist on call if the hospitalist that took care of you is not available. Once you are discharged, your primary care physician will handle any further medical issues. Please note that NO REFILLS for any discharge medications will be authorized once you are discharged, as it is imperative that you return to your primary care physician (or establish a relationship with a primary care physician if you do not have one) for your aftercare needs so that they can reassess your need for medications and monitor your lab values.     Increase activity slowly    Complete by:  As directed              Discharge Medications       Medication List    TAKE these medications        acetaminophen 325 MG tablet  Commonly known as:  TYLENOL  Take 2 tablets (650 mg total)  by mouth every 4 (four) hours as needed for headache or mild pain.     amLODipine 10 MG tablet  Commonly known as:  NORVASC  Take 10 mg by mouth daily.     apixaban 5 MG Tabs tablet  Commonly known as:  ELIQUIS  Take 1 tablet (5 mg total) by mouth 2 (two) times daily.     atorvastatin 20 MG tablet  Commonly known as:  LIPITOR  Take 20 mg by mouth daily.     cloNIDine 0.1 MG tablet  Commonly known as:  CATAPRES  Take 0.1 mg by mouth at bedtime.     COMBIGAN OP  Place 1 drop into both eyes 2 (two) times daily.     furosemide 10 MG/ML solution  Commonly known as:  LASIX  Take 20 mg by mouth daily.     furosemide 20 MG tablet  Commonly known as:  LASIX  Take 1 tablet (20 mg total) by mouth daily.     glipiZIDE 10 MG tablet  Commonly known as:  GLUCOTROL  Take 10 mg by mouth 2 (two) times daily before a meal.     HUMIRA 40 MG/0.8ML Pskt  Generic drug:  Adalimumab  Inject 40 mg as directed every 14 (fourteen) days. Every other week     insulin detemir 100 unit/ml Soln  Commonly known as:  LEVEMIR  Inject 20 Units into the skin at bedtime.     losartan-hydrochlorothiazide 50-12.5 MG per tablet  Commonly known as:  HYZAAR  Take 1 tablet by mouth daily.     NEXIUM 40 MG capsule  Generic drug:  esomeprazole  Take 40 mg by mouth daily.     predniSONE 5 MG tablet  Commonly known as:  DELTASONE  Take 10 mg by mouth as needed (flare up). 1 1/2 tablets as needed     ROLAIDS PO  Take 1-2 tablets by mouth daily as needed (reflux).     sertraline 100 MG tablet  Commonly known as:  ZOLOFT  Take 100 mg by mouth daily.     sitaGLIPtin 100 MG tablet  Commonly known as:  JANUVIA  Take 100 mg by mouth daily.     TRAVATAN 0.004 % ophthalmic solution  Generic drug:  travoprost (benzalkonium)  Place 1 drop into both eyes at bedtime.     triamcinolone cream 0.1 %  Commonly known as:  KENALOG  Apply 1 application topically 2 (two) times daily.        Major procedures and  Radiology Reports - PLEASE review detailed and final reports for all details, in brief -   TTE   Left ventricle: The cavity size was normal. Wall thickness wasnormal. Systolic function was normal. The estimated ejectionfraction was in the range of 55% to 60%. Wall motion was normal;there were no regional wall motion abnormalities. Dopplerparameters are consistent with abnormal left ventricularrelaxation (grade 1 diastolic dysfunction). - Left atrium: The atrium was mildly dilated.  Impressions: Normal LV systolic function; grade 1 diastolic dysfunction; mild LAE; trace TR.   Stress test. No reversible ischemia  EEG - This is a normal EEG recording during wakefulness. No evidence of an epileptic disorder was demonstrated. However, a normal EEG recording in and of itself does not rule out seizure disorder.   Dg Chest 2 View  02/14/2015   CLINICAL DATA:  Vomiting, near syncope over past several days. Chest pain.  EXAM: CHEST  2 VIEW  COMPARISON:  01/12/2008  FINDINGS: The heart size and mediastinal contours are within normal limits. Both lungs are clear. The visualized skeletal structures are unremarkable.  IMPRESSION: No active cardiopulmonary disease.   Electronically Signed   By: Rolm Baptise M.D.   On: 02/14/2015 10:36   Mr Brain Wo Contrast  02/16/2015   CLINICAL DATA:  74 year old female with chest pain, dizziness, syncope. Initial encounter.  EXAM: MRI HEAD WITHOUT CONTRAST  TECHNIQUE: Multiplanar, multiecho pulse sequences of the brain and surrounding structures were obtained without intravenous contrast.  COMPARISON:  None.  FINDINGS: Cerebral volume is within normal limits for age. No restricted diffusion to suggest acute infarction. No midline shift, mass effect, evidence of mass lesion, ventriculomegaly, extra-axial collection or acute intracranial hemorrhage. Cervicomedullary junction and pituitary are within normal limits. Major intracranial vascular flow voids are within normal  limits.  Mild for age nonspecific mostly anterior frontal lobe subcortical white matter T2 and FLAIR hyperintensity. Elsewhere Pearline Cables and white matter signal is within normal limits for age. No cortical encephalomalacia or chronic cerebral blood products.  Visible internal auditory structures appear normal. Trace left mastoid fluid and right sphenoid sinus mucosal thickening, other paranasal sinuses and mastoids are clear. Orbit and scalp soft tissues appear normal. Negative visualized cervical spine. Visualized bone marrow signal is within normal limits.  IMPRESSION: No acute intracranial abnormality. Mild for age nonspecific cerebral white matter signal changes, most commonly due to chronic small vessel disease.   Electronically Signed   By: Genevie Ann M.D.   On: 02/16/2015 10:58   Nm Myocar Multi W/spect W/wall Motion / Ef  02/15/2015   CLINICAL DATA:  74 year old with chest pain.  EXAM: MYOCARDIAL IMAGING WITH SPECT (REST AND PHARMACOLOGIC-STRESS)  GATED LEFT VENTRICULAR WALL MOTION STUDY  LEFT VENTRICULAR EJECTION FRACTION  TECHNIQUE: Standard myocardial SPECT imaging was performed after resting intravenous injection of 10 mCi Tc-70m sestamibi. Subsequently, intravenous infusion of Lexiscan was performed under the supervision of the Cardiology staff. At peak effect of the drug, 30 mCi Tc-1m sestamibi was injected intravenously and standard myocardial SPECT imaging was  performed. Quantitative gated imaging was also performed to evaluate left ventricular wall motion, and estimate left ventricular ejection fraction.  COMPARISON:  None.  FINDINGS: Perfusion: No decreased activity in the left ventricle on stress imaging to suggest reversible ischemia or infarction.  Wall Motion: Normal left ventricular wall motion. No left ventricular dilation.  Left Ventricular Ejection Fraction: 68 %  End diastolic volume 49 ml  End systolic volume 16 ml  IMPRESSION: 1. No reversible ischemia or infarction.  2. Normal left  ventricular wall motion.  3. Left ventricular ejection fraction is 68%.  4. Low-risk stress test findings*.  *2012 Appropriate Use Criteria for Coronary Revascularization Focused Update: J Am Coll Cardiol. 7078;67(5):449-201. http://content.airportbarriers.com.aspx?articleid=1201161   Electronically Signed   By: Markus Daft M.D.   On: 02/15/2015 15:33    Micro Results      No results found for this or any previous visit (from the past 240 hour(s)).     Today   Subjective    Yesha Muchow today has no headache,no chest abdominal pain,no new weakness tingling or numbness, feels much better wants to go home today.    Objective   Blood pressure 152/58, pulse 58, temperature 98.4 F (36.9 C), temperature source Oral, resp. rate 18, height 4\' 10"  (1.473 m), weight 84.006 kg (185 lb 3.2 oz), SpO2 98 %.   Intake/Output Summary (Last 24 hours) at 02/17/15 1031 Last data filed at 02/17/15 1004  Gross per 24 hour  Intake    960 ml  Output   2250 ml  Net  -1290 ml    Exam Awake Alert, Oriented x 3, No new F.N deficits, Normal affect [place].AT,PERRAL Supple Neck,No JVD, No cervical lymphadenopathy appriciated.  Symmetrical Chest wall movement, Good air movement bilaterally, CTAB RRR,No Gallops,Rubs or new Murmurs, No Parasternal Heave +ve B.Sounds, Abd Soft, Non tender, No organomegaly appriciated, No rebound -guarding or rigidity. No Cyanosis, Clubbing or edema, No new Rash or bruise   Data Review   CBC w Diff: Lab Results  Component Value Date   WBC 9.5 02/14/2015   HGB 14.9 02/14/2015   HCT 42.7 02/14/2015   PLT 305 02/14/2015    CMP: Lab Results  Component Value Date   NA 136 02/14/2015   K 3.8 02/14/2015   CL 101 02/14/2015   CO2 23 02/14/2015   BUN 22* 02/14/2015   CREATININE 1.04* 02/14/2015   PROT 7.1 02/14/2015   ALBUMIN 3.9 02/14/2015   BILITOT 1.1 02/14/2015   ALKPHOS 73 02/14/2015   AST 26 02/14/2015   ALT 13* 02/14/2015  . Lab Results  Component  Value Date   HGBA1C 8.4* 02/14/2015      Total Time in preparing paper work, data evaluation and todays exam - 35 minutes  Thurnell Lose M.D on 02/17/2015 at 10:31 AM  [facility]   Office  [phone]

## 2015-02-17 NOTE — Progress Notes (Signed)
  RD consulted for nutrition education per pt request regarding diabetes.   Lab Results  Component Value Date   HGBA1C 8.4* 02/14/2015    RD provided "Carbohydrate Counting for People with Diabetes" handout from the Academy of Nutrition and Dietetics. Discussed different food groups and their effects on blood sugar, emphasizing carbohydrate-containing foods. Provided list of carbohydrates and recommended serving sizes of common foods. Encouraged avoiding sugar-sweetened beverages and limiting other sweets/dessert.  Discussed importance of controlled and consistent carbohydrate intake throughout the day. Provided examples of ways to balance meals/snacks and encouraged intake of high-fiber, whole grain complex carbohydrates. Patient also had question regarding diet and hypertension. RD encouraged a lower sodium diet. Discouraged use of salt and packaged/canned foods. Provided recommended sodium content per serving. Provided carb-balanced low sodium sample menus and "Sodium content of Foods List" from the Academy of Nutrition and Dietetics. Teach back method used.  Expect good compliance.  Body mass index is 38.72 kg/(m^2). Pt meets criteria for Obesity based on current BMI.  Current diet order is Heart Healthy/Carb Modified, patient is consuming approximately 100% of meals at this time. Labs and medications reviewed. No further nutrition interventions warranted at this time. RD contact information provided. If additional nutrition issues arise, please re-consult RD.  Scarlette Ar RD, LDN Inpatient Clinical Dietitian Pager: [phone] After Hours Pager: [phone]

## 2015-02-17 NOTE — Progress Notes (Signed)
Inpatient Diabetes Program Recommendations  AACE/ADA: New Consensus Statement on Inpatient Glycemic Control (2013)  Target Ranges:  Prepandial:   less than 140 mg/dL      Peak postprandial:   less than 180 mg/dL (1-2 hours)      Critically ill patients:  140 - 180 mg/dL   Results for Christina Bishop, Christina Bishop (MRN [id number]) as of 02/17/2015 08:47  Ref. Range 02/16/2015 05:47 02/16/2015 12:07 02/16/2015 16:57 02/16/2015 21:19 02/17/2015 06:34 02/17/2015 06:58  Glucose-Capillary Latest Ref Range: 65-99 mg/dL 118 (H) 150 (H) 168 (H) 101 (H) 75 77    Diabetes history: DM2 Outpatient Diabetes medications: Glipizide 10 mg BID, Levemir 20 units QHS, Januvia 100 mg daily Current orders for Inpatient glycemic control: Levemir 20 units QHS, Novolog 0-9 units TID with meals, Tradjenta 5 mg daily  Inpatient Diabetes Program Recommendations Insulin - Basal: Fasting glucose 75 mg/dl this morning. May want to consider decreasing Levemir to 18 units QHS.  Thanks, Barnie Alderman, RN, MSN, CCRN, CDE Diabetes Coordinator Inpatient Diabetes Program [phone] (Team Pager from [place] to [place]) [phone] ([place] office) [phone] [facility] office) [phone] [facility] office)

## 2015-02-23 ENCOUNTER — Encounter: Payer: Self-pay | Admitting: Neurology

## 2015-02-23 ENCOUNTER — Ambulatory Visit (INDEPENDENT_AMBULATORY_CARE_PROVIDER_SITE_OTHER): Payer: Medicare Other | Admitting: Neurology

## 2015-02-23 VITALS — BP 141/85 | HR 85 | Ht <= 58 in | Wt 171.0 lb

## 2015-02-23 DIAGNOSIS — R55 Syncope and collapse: Secondary | ICD-10-CM | POA: Diagnosis not present

## 2015-02-23 DIAGNOSIS — I951 Orthostatic hypotension: Secondary | ICD-10-CM

## 2015-02-23 HISTORY — DX: Orthostatic hypotension: I95.1

## 2015-02-23 NOTE — Patient Instructions (Addendum)
We will check a carotid doppler study. Try to check blood sugar at the time you have symptoms next.  Syncope Syncope is a medical term for fainting or passing out. This means you lose consciousness and drop to the ground. People are generally unconscious for less than 5 minutes. You may have some muscle twitches for up to 15 seconds before waking up and returning to normal. Syncope occurs more often in older adults, but it can happen to anyone. While most causes of syncope are not dangerous, syncope can be a sign of a serious medical problem. It is important to seek medical care.  CAUSES  Syncope is caused by a sudden drop in blood flow to the brain. The specific cause is often not determined. Factors that can bring on syncope include:  Taking medicines that lower blood pressure.  Sudden changes in posture, such as standing up quickly.  Taking more medicine than prescribed.  Standing in one place for too long.  Seizure disorders.  Dehydration and excessive exposure to heat.  Low blood sugar (hypoglycemia).  Straining to have a bowel movement.  Heart disease, irregular heartbeat, or other circulatory problems.  Fear, emotional distress, seeing blood, or severe pain. SYMPTOMS  Right before fainting, you may:  Feel dizzy or light-headed.  Feel nauseous.  See all white or all black in your field of vision.  Have cold, clammy skin. DIAGNOSIS  Your health care provider will ask about your symptoms, perform a physical exam, and perform an electrocardiogram (ECG) to record the electrical activity of your heart. Your health care provider may also perform other heart or blood tests to determine the cause of your syncope which may include:  Transthoracic echocardiogram (TTE). During echocardiography, sound waves are used to evaluate how blood flows through your heart.  Transesophageal echocardiogram (TEE).  Cardiac monitoring. This allows your health care provider to monitor your  heart rate and rhythm in real time.  Holter monitor. This is a portable device that records your heartbeat and can help diagnose heart arrhythmias. It allows your health care provider to track your heart activity for several days, if needed.  Stress tests by exercise or by giving medicine that makes the heart beat faster. TREATMENT  In most cases, no treatment is needed. Depending on the cause of your syncope, your health care provider may recommend changing or stopping some of your medicines. HOME CARE INSTRUCTIONS  Have someone stay with you until you feel stable.  Do not drive, use machinery, or play sports until your health care provider says it is okay.  Keep all follow-up appointments as directed by your health care provider.  Lie down right away if you start feeling like you might faint. Breathe deeply and steadily. Wait until all the symptoms have passed.  Drink enough fluids to keep your urine clear or pale yellow.  If you are taking blood pressure or heart medicine, get up slowly and take several minutes to sit and then stand. This can reduce dizziness. SEEK IMMEDIATE MEDICAL CARE IF:   You have a severe headache.  You have unusual pain in the chest, abdomen, or back.  You are bleeding from your mouth or rectum, or you have black or tarry stool.  You have an irregular or very fast heartbeat.  You have pain with breathing.  You have repeated fainting or seizure-like jerking during an episode.  You faint when sitting or lying down.  You have confusion.  You have trouble walking.  You have  severe weakness.  You have vision problems. If you fainted, call your local emergency services (911 in [place] Do not drive yourself to the hospital.  MAKE SURE YOU:  Understand these instructions.  Will watch your condition.  Will get help right away if you are not doing well or get worse. Document Released: 06/11/2005 Document Revised: 06/16/2013 Document Reviewed:  08/10/2011 [facility] Patient Information 2015 [place], [place]. This information is not intended to replace advice given to you by your health care provider. Make sure you discuss any questions you have with your health care provider.

## 2015-02-23 NOTE — Progress Notes (Signed)
Reason for visit: Near syncope  Referring physician: Kelayres is a 74 y.o. female  History of present illness:  Christina Bishop is a 74 year old right-handed white female with a history of difficulty with dizziness with standing that dates back at least 5 years. The patient has also had some increasing problems with dyspnea on exertion, minimal activity may result in severe shortness of breath. The patient had an actual syncopal event associated with fecal incontinence in October 2015. She had a near syncopal event that occurred on 02/14/2015. She was at a restaurant at the time, start feeling dizzy and lightheaded, she has had episodes of visual dimming, sweats, feeling nervous and jittery. The patient does have diabetes, but she has not checked a blood sugar during an event. She indicates that she has quite frequent episodes of dizziness which may occur on average every other day. The patient feels better when she sits down or lies down. She will have some slight chest discomfort and she has frequent elevations of the heart associated with her atrial fibrillation. The patient was admitted to the hospital on 02/14/2015, MRI of the brain showed very minimal nonacute small vessel disease, and EEG study was normal. A 2-D echocardiogram showed an ejection fraction of 60%, she will be following up with Dr. Martinique from cardiology tomorrow. She has some history of some numbness of the hands, no numbness of the feet. She has generalized fatigue issues. She is sent to this office for an evaluation.  Past Medical History  Diagnosis Date  . DM2 (diabetes mellitus, type 2)     Uncontrolled  . HTN (hypertension)   . Psoriasis   . Psoriatic arthritis   . H/O: hysterectomy   . Depression   . SOB (shortness of breath) on exertion   . Glaucoma   . Hypercholesterolemia   . Obesity   . Paroxysmal atrial fibrillation   . Arthritis     scoriatic, rheumatoid    Past Surgical History    Procedure Laterality Date  . Total knee arthroplasty    . Cardiac catheterization    . Abdominal hysterectomy    . Appendectomy    . Joint replacement      Both Knees  . Abdominal surgery    . Tubal ligation      Family History  Problem Relation Age of Onset  . Stroke Mother   . Heart attack Father   . Leukemia Paternal Uncle   . Diabetes Cousin   . Leukemia Cousin   . Heart disease Brother     Social history:  reports that she has never smoked. She has never used smokeless tobacco. She reports that she does not drink alcohol or use illicit drugs.  Medications:  Prior to Admission medications   Medication Sig Start Date End Date Taking? Authorizing Provider  acetaminophen (TYLENOL) 325 MG tablet Take 2 tablets (650 mg total) by mouth every 4 (four) hours as needed for headache or mild pain. 03/27/14  Yes Luke K Kilroy, PA-C  amLODipine (NORVASC) 10 MG tablet Take 10 mg by mouth daily. 09/17/14  Yes Historical Provider, MD  apixaban (ELIQUIS) 5 MG TABS tablet Take 1 tablet (5 mg total) by mouth 2 (two) times daily. 10/04/14  Yes Peter M Martinique, MD  atorvastatin (LIPITOR) 20 MG tablet Take 20 mg by mouth daily.  02/06/12  Yes Historical Provider, MD  Brimonidine Tartrate-Timolol (COMBIGAN OP) Place 1 drop into both eyes 2 (two) times daily.  Yes Historical Provider, MD  Ca Carbonate-Mag Hydroxide (ROLAIDS PO) Take 1-2 tablets by mouth daily as needed (reflux).   Yes Historical Provider, MD  cloNIDine (CATAPRES) 0.1 MG tablet Take 0.1 mg by mouth at bedtime.   Yes Historical Provider, MD  furosemide (LASIX) 20 MG tablet Take 1 tablet (20 mg total) by mouth daily. 01/19/15  Yes Peter M Martinique, MD  glipiZIDE (GLUCOTROL) 10 MG tablet Take 10 mg by mouth 2 (two) times daily before a meal.   Yes Historical Provider, MD  HUMIRA 40 MG/0.8ML PSKT Inject 40 mg as directed every 14 (fourteen) days. Every other week 09/10/14  Yes Historical Provider, MD  insulin detemir (LEVEMIR) 100 unit/ml SOLN  Inject 20 Units into the skin at bedtime.    Yes Historical Provider, MD  losartan-hydrochlorothiazide (HYZAAR) 50-12.5 MG per tablet Take 1 tablet by mouth daily. 02/13/13  Yes Tanda Rockers, MD  NEXIUM 40 MG capsule Take 40 mg by mouth daily.  01/29/12  Yes Historical Provider, MD  predniSONE (DELTASONE) 5 MG tablet Take 10 mg by mouth as needed (flare up). 1 1/2 tablets as needed 07/27/14  Yes Historical Provider, MD  sertraline (ZOLOFT) 100 MG tablet Take 100 mg by mouth daily.  02/24/12  Yes Historical Provider, MD  sitaGLIPtin (JANUVIA) 100 MG tablet Take 100 mg by mouth daily.   Yes Historical Provider, MD  travoprost, benzalkonium, (TRAVATAN) 0.004 % ophthalmic solution Place 1 drop into both eyes at bedtime.   Yes Historical Provider, MD  triamcinolone cream (KENALOG) 0.1 % Apply 1 application topically 2 (two) times daily.   Yes Historical Provider, MD      Allergies  Allergen Reactions  . Ace Inhibitors     dyspnea  . Codeine Itching  . Metoprolol Other (See Comments)    Hallucinations    ROS:  Out of a complete 14 system review of symptoms, the patient complains only of the following symptoms, and all other reviewed systems are negative.  Fatigue Palpitations of the heart Hearing loss Skin rash Shortness of breath Bowel incontinence with blackout Achy muscles Allergies Feeling hot  Blood pressure 141/85, pulse 85, height 4\' 9"  (1.448 m), weight 171 lb (77.565 kg).   Blood pressure, right arm, sitting is 578 systolic. Blood pressure, right arm, standing is 469 systolic.  Physical Exam  General: The patient is alert and cooperative at the time of the examination. The patient is minimally obese.  Eyes: Pupils are equal, round, and reactive to light. Discs are flat bilaterally.  Neck: The neck is supple, no carotid bruits are noted.  Respiratory: The respiratory examination is clear.  Cardiovascular: The cardiovascular examination reveals a regular rate and rhythm,  no obvious murmurs or rubs are noted.  Skin: Extremities are without significant edema.  Neurologic Exam  Mental status: The patient is alert and oriented x 3 at the time of the examination. The patient has apparent normal recent and remote memory, with an apparently normal attention span and concentration ability.  Cranial nerves: Facial symmetry is present. There is good sensation of the face to pinprick and soft touch bilaterally. The strength of the facial muscles and the muscles to head turning and shoulder shrug are normal bilaterally. Speech is well enunciated, no aphasia or dysarthria is noted. Extraocular movements are full. Visual Mand are full. The tongue is midline, and the patient has symmetric elevation of the soft palate. No obvious hearing deficits are noted.  Motor: The motor testing reveals 5 over 5 strength  of all 4 extremities. Good symmetric motor tone is noted throughout.  Sensory: Sensory testing is intact to pinprick, soft touch, vibration sensation, and position sense on all 4 extremities. No evidence of extinction is noted.  Coordination: Cerebellar testing reveals good finger-nose-finger and heel-to-shin bilaterally.  Gait and station: Gait is normal. Tandem gait is slightly unsteady. Romberg is negative. No drift is seen.  Reflexes: Deep tendon reflexes are symmetric and normal bilaterally. Toes are downgoing bilaterally.   MRI brain 02/16/15:  IMPRESSION: No acute intracranial abnormality. Mild for age nonspecific cerebral white matter signal changes, most commonly due to chronic small vessel disease.   Assessment/Plan:  1. Syncope, near syncope  2. Dyspnea on exertion  3. Orthostatic hypotension  The patient appears to have some issues with orthostasis today. The episodes of visual dimming, dizziness, diaphoresis may be consistent with a drop in blood pressure. The patient has significant dyspnea on exertion, the etiology is unclear. The patient  drops her blood pressure today from 549 to 826 systolic with sitting to standing. She will undergo a cardiology evaluation, she may end up getting a prolonged cardiac monitor. I will set her up for a carotid Doppler study. I have asked her to check her blood sugars during a symptomatic period.  Christina Alexanders MD 02/23/2015 9:10 PM  [facility] [address] [place], [place] [address]  Phone [phone] Fax [phone]

## 2015-02-24 ENCOUNTER — Encounter: Payer: Self-pay | Admitting: Physician Assistant

## 2015-02-24 ENCOUNTER — Ambulatory Visit (INDEPENDENT_AMBULATORY_CARE_PROVIDER_SITE_OTHER): Payer: Medicare Other | Admitting: Physician Assistant

## 2015-02-24 ENCOUNTER — Ambulatory Visit (INDEPENDENT_AMBULATORY_CARE_PROVIDER_SITE_OTHER): Payer: Medicare Other

## 2015-02-24 VITALS — BP 142/68 | HR 90 | Ht <= 58 in | Wt 170.9 lb

## 2015-02-24 DIAGNOSIS — I10 Essential (primary) hypertension: Secondary | ICD-10-CM

## 2015-02-24 DIAGNOSIS — E7800 Pure hypercholesterolemia, unspecified: Secondary | ICD-10-CM

## 2015-02-24 DIAGNOSIS — I481 Persistent atrial fibrillation: Secondary | ICD-10-CM | POA: Diagnosis not present

## 2015-02-24 DIAGNOSIS — E1129 Type 2 diabetes mellitus with other diabetic kidney complication: Secondary | ICD-10-CM | POA: Diagnosis not present

## 2015-02-24 DIAGNOSIS — M8580 Other specified disorders of bone density and structure, unspecified site: Secondary | ICD-10-CM | POA: Diagnosis not present

## 2015-02-24 DIAGNOSIS — R0602 Shortness of breath: Secondary | ICD-10-CM

## 2015-02-24 DIAGNOSIS — R55 Syncope and collapse: Secondary | ICD-10-CM

## 2015-02-24 DIAGNOSIS — R0600 Dyspnea, unspecified: Secondary | ICD-10-CM | POA: Diagnosis not present

## 2015-02-24 DIAGNOSIS — I480 Paroxysmal atrial fibrillation: Secondary | ICD-10-CM

## 2015-02-24 DIAGNOSIS — E559 Vitamin D deficiency, unspecified: Secondary | ICD-10-CM | POA: Diagnosis not present

## 2015-02-24 NOTE — Patient Instructions (Signed)
Schedule 30 day event monitor  Keep Appointment with Dr.Jordan as planned 04/05/15 at 9:45 am

## 2015-02-24 NOTE — Progress Notes (Signed)
Patient ID: Christina Bishop, female   DOB: 1940/10/25, 74 y.o.   MRN: [id number]     Date:  02/24/2015   ID:  Christina Bishop, DOB [birth year], MRN [id number]  PCP:  Thressa Sheller, MD  Primary Cardiologist:  Martinique  Chief Complaint  Patient presents with  . Follow-up  . Shortness of Breath    everytime when getting up and walk, sometimes when standing for a long time starts to feels light headed     History of Present Illness:  Christina Bishop is a 74 y.o. female with history of diabetes mellitus, hypertension, atrial fibrillation-CHADSVASC > 3, psoriatic arthritis, depression, hyperlipidemia and obesity. In the fall of 2015 she had a syncopal episode was found to be in atrial fibrillation and was started on metoprolol and Eliquis. Her echocardiogram at that time indicated an ejection fraction of 60-65% with normal wall motion.   Patient was admitted to [facility] 02/14/2015 with chest pain and dizziness.  She had a stress test the following day with no reversible ischemia and ejection fraction of 60%. Echocardiogram that same day revealed an EF of 55-60% with grade 1 diastolic dysfunction. She was continued on a liquid's aspirin was discontinued. Due to bradycardia, neither metoprolol nor diltiazem were added.  She also has hallucinations when she is on metoprolol.  She had MRI of the brain which was negative for anything acute.  EEG of her brain was also unremarkable.   She presents for posthospital follow-up. She reports doing well and drinking more water so she doesn't get dehydrated.  She does report significant shortness of breath when she is ambulating which is why she was originally taken off of lisinopril and switch to Hyzaar.  She says she works at [employer] usually in the last time she was there was before her hospitalization.  Orthostatic vital signs were checked yesterday and her systolic pressure only dropped by 10.  The patient currently denies nausea,  vomiting, fever, chest pain, orthopnea, dizziness, PND, cough, congestion, abdominal pain, hematochezia, melena, lower extremity edema, claudication.  Wt Readings from Last 3 Encounters:  02/24/15 170 lb 14.4 oz (77.52 kg)  02/23/15 171 lb (77.565 kg)  02/17/15 185 lb 3.2 oz (84.006 kg)     Past Medical History  Diagnosis Date  . DM2 (diabetes mellitus, type 2)     Uncontrolled  . HTN (hypertension)   . Psoriasis   . Psoriatic arthritis   . H/O: hysterectomy   . Depression   . SOB (shortness of breath) on exertion   . Glaucoma   . Hypercholesterolemia   . Obesity   . Paroxysmal atrial fibrillation   . Arthritis     scoriatic, rheumatoid    Current Outpatient Prescriptions  Medication Sig Dispense Refill  . acetaminophen (TYLENOL) 325 MG tablet Take 2 tablets (650 mg total) by mouth every 4 (four) hours as needed for headache or mild pain.    Marland Kitchen amLODipine (NORVASC) 10 MG tablet Take 10 mg by mouth daily.  0  . apixaban (ELIQUIS) 5 MG TABS tablet Take 1 tablet (5 mg total) by mouth 2 (two) times daily. 60 tablet 11  . atorvastatin (LIPITOR) 20 MG tablet Take 20 mg by mouth daily.     . Brimonidine Tartrate-Timolol (COMBIGAN OP) Place 1 drop into both eyes 2 (two) times daily.     . Ca Carbonate-Mag Hydroxide (ROLAIDS PO) Take 1-2 tablets by mouth daily as needed (reflux).    . cloNIDine (CATAPRES) 0.1 MG tablet  Take 0.1 mg by mouth at bedtime.    . furosemide (LASIX) 20 MG tablet Take 1 tablet (20 mg total) by mouth daily. 30 tablet 2  . glipiZIDE (GLUCOTROL) 10 MG tablet Take 10 mg by mouth 2 (two) times daily before a meal.    . HUMIRA 40 MG/0.8ML PSKT Inject 40 mg as directed every 14 (fourteen) days. Every other week  5  . insulin detemir (LEVEMIR) 100 unit/ml SOLN Inject 20 Units into the skin at bedtime.     Marland Kitchen losartan-hydrochlorothiazide (HYZAAR) 50-12.5 MG per tablet Take 1 tablet by mouth daily. 30 tablet 11  . NEXIUM 40 MG capsule Take 40 mg by mouth daily.     .  predniSONE (DELTASONE) 5 MG tablet Take 10 mg by mouth as needed (flare up). 1 1/2 tablets as needed  0  . sertraline (ZOLOFT) 100 MG tablet Take 100 mg by mouth daily.     . sitaGLIPtin (JANUVIA) 100 MG tablet Take 100 mg by mouth daily.    . travoprost, benzalkonium, (TRAVATAN) 0.004 % ophthalmic solution Place 1 drop into both eyes at bedtime.    . triamcinolone cream (KENALOG) 0.1 % Apply 1 application topically 2 (two) times daily.     No current facility-administered medications for this visit.    Allergies:    Allergies  Allergen Reactions  . Ace Inhibitors     dyspnea  . Codeine Itching  . Metoprolol Other (See Comments)    Hallucinations    Social History:  The patient  reports that she has never smoked. She has never used smokeless tobacco. She reports that she does not drink alcohol or use illicit drugs.   Family history:   Family History  Problem Relation Age of Onset  . Stroke Mother   . Heart attack Father   . Leukemia Paternal Uncle   . Diabetes Cousin   . Leukemia Cousin   . Heart disease Brother     ROS:  Please see the history of present illness.  All other systems reviewed and negative.   PHYSICAL EXAM: VS:  BP 142/68 mmHg  Pulse 90  Ht 4\' 9"  (1.448 m)  Wt 170 lb 14.4 oz (77.52 kg)  BMI 36.97 kg/m2 Obese, well developed, in no acute distress HEENT: Pupils are equal round react to light accommodation extraocular movements are intact.  Neck: no JVDNo cervical lymphadenopathy. Cardiac: Regular rate and rhythm without murmurs rubs or gallops. Lungs:  clear to auscultation bilaterally, no wheezing, rhonchi or rales Abd: soft, nontender, positive bowel sounds all quadrants, no hepatosplenomegaly Ext: no lower extremity edema.  2+ radial and dorsalis pedis pulses. Skin: warm and dry Neuro:  Grossly normal   ASSESSMENT AND PLAN:  Problem List Items Addressed This Visit    Syncope   SOB (shortness of breath) on exertion - Primary   Relevant Orders    Cardiac event monitor   Hypercholesterolemia   Relevant Orders   Cardiac event monitor   HTN (hypertension)   Relevant Orders   Cardiac event monitor   Atrial fibrillation   Relevant Orders   Cardiac event monitor      Dyspnea with exertion: We ambulated the patient around the office and oxygen saturations only decreased to 96%.  Heart rate was 100..  Is likely that she is just deconditioned and getting shortness of breath because of that. She had a good echo and negative Lexiscan. Lungs are clear on exam no signs of heart failure.  Syncope: Heart rate is  regular on exam rate of 90. Although, she has had documented heart rates as low as 40s and 50s which is why diltiazem and metoprolol were avoided. We'll place her on a 30 day CardioNet monitor.  Essential hypertension: No changes to current therapy.  Hyperlipidemia: Continue statin   Paroxysmal atrial fibrillation As above. 30 day CardioNet. monitor for significant arrhythmia.  Continue all across   Follow-up in 6 weeks as scheduled

## 2015-02-25 ENCOUNTER — Telehealth: Payer: Self-pay

## 2015-02-25 NOTE — Telephone Encounter (Signed)
Spoke to patient she stated she saw PCP 02/24/15 and told her she thought losartan causing her sob.PCP advised it might be one of her diabetic medications.PCP advised her to hold losartan for 1 week monitor B/P.Stated she will be seeing PCP after 1 week.Stated she did not not want to go back to lisinopril it caused sob.Advised to call back if needed.

## 2015-03-03 DIAGNOSIS — E039 Hypothyroidism, unspecified: Secondary | ICD-10-CM | POA: Diagnosis not present

## 2015-03-03 DIAGNOSIS — I10 Essential (primary) hypertension: Secondary | ICD-10-CM | POA: Diagnosis not present

## 2015-03-17 DIAGNOSIS — E119 Type 2 diabetes mellitus without complications: Secondary | ICD-10-CM | POA: Diagnosis not present

## 2015-03-17 DIAGNOSIS — E1129 Type 2 diabetes mellitus with other diabetic kidney complication: Secondary | ICD-10-CM | POA: Diagnosis not present

## 2015-03-17 DIAGNOSIS — I10 Essential (primary) hypertension: Secondary | ICD-10-CM | POA: Diagnosis not present

## 2015-03-30 ENCOUNTER — Telehealth: Payer: Self-pay | Admitting: Cardiology

## 2015-03-30 NOTE — Telephone Encounter (Signed)
Returned call to patient monitor results given. 

## 2015-03-30 NOTE — Telephone Encounter (Signed)
Returning your call. °

## 2015-03-31 DIAGNOSIS — I10 Essential (primary) hypertension: Secondary | ICD-10-CM | POA: Diagnosis not present

## 2015-03-31 DIAGNOSIS — H6120 Impacted cerumen, unspecified ear: Secondary | ICD-10-CM | POA: Diagnosis not present

## 2015-03-31 DIAGNOSIS — E119 Type 2 diabetes mellitus without complications: Secondary | ICD-10-CM | POA: Diagnosis not present

## 2015-04-05 ENCOUNTER — Ambulatory Visit (INDEPENDENT_AMBULATORY_CARE_PROVIDER_SITE_OTHER): Payer: Medicare Other | Admitting: Cardiology

## 2015-04-05 ENCOUNTER — Encounter: Payer: Self-pay | Admitting: Cardiology

## 2015-04-05 VITALS — BP 122/66 | HR 66 | Ht <= 58 in | Wt 174.6 lb

## 2015-04-05 DIAGNOSIS — Z794 Long term (current) use of insulin: Secondary | ICD-10-CM

## 2015-04-05 DIAGNOSIS — E7800 Pure hypercholesterolemia, unspecified: Secondary | ICD-10-CM | POA: Diagnosis not present

## 2015-04-05 DIAGNOSIS — I480 Paroxysmal atrial fibrillation: Secondary | ICD-10-CM | POA: Diagnosis not present

## 2015-04-05 DIAGNOSIS — E1165 Type 2 diabetes mellitus with hyperglycemia: Secondary | ICD-10-CM

## 2015-04-05 DIAGNOSIS — I10 Essential (primary) hypertension: Secondary | ICD-10-CM | POA: Diagnosis not present

## 2015-04-05 DIAGNOSIS — E118 Type 2 diabetes mellitus with unspecified complications: Secondary | ICD-10-CM | POA: Diagnosis not present

## 2015-04-05 DIAGNOSIS — E039 Hypothyroidism, unspecified: Secondary | ICD-10-CM | POA: Diagnosis not present

## 2015-04-05 NOTE — Progress Notes (Signed)
Patient ID: Christina Bishop, female   DOB: 05/11/1941, 74 y.o.   MRN: [id number]     Date:  04/05/2015   ID:  Christina Bishop, DOB Feb 09, 1941, MRN [id number]  PCP:  Thressa Sheller, MD  Primary Cardiologist:  Martin[place]  Chief Complaint  Patient presents with  . Follow-up    30 day event monitor//pt states SOB on exertion//PCP took her off HYZAAR bc of SOB, breathing has gotten better  . Dizziness    occasionally, breaks out in a sweat, feels better when she gets off her feet//has passed out twice, pt thinks bc her BP and blood sugar drop     History of Present Illness:  Christina Bishop is a 74 y.o. female with history of diabetes mellitus, hypertension, atrial fibrillation-CHADSVASC > 3, psoriatic arthritis, depression, hyperlipidemia and obesity. In the fall of 2015 she had a syncopal episode was found to be in atrial fibrillation and was started on metoprolol and Eliquis. Her echocardiogram at that time indicated an ejection fraction of 60-65% with normal wall motion. She developed hallucinations on metoprolol and this was stopped.   Patient was admitted to [facility] 02/14/2015 with chest pain and near syncope.  A Myoview study showed no reversible ischemia and ejection fraction of 68%. Echocardiogram  revealed an EF of 55-60% with grade 1 diastolic dysfunction.  Due to bradycardia, neither metoprolol nor diltiazem were added.   She had MRI of the brain which was negative for  Acute findings.  EEG of her brain was also unremarkable. Subsequent Event monitor showed episodes of AFib with controlled rate- longest 4 hrs and 40 minute. No significant tachy or bradycardia.   On follow up today she reports that she is feeling much better with adjustment in meds. Her losartan HCT and glipizide were stopped. One week ago her Clonidine was doubled to 0.4 mg bid.   The patient currently denies nausea, vomiting, fever, chest pain, orthopnea, dizziness, PND, cough, congestion, abdominal  pain, hematochezia, melena, lower extremity edema, claudication. She is aware when her heart is out of rhythm but otherwise has no cardiac complaints.  Wt Readings from Last 3 Encounters:  04/05/15 79.198 kg (174 lb 9.6 oz)  02/24/15 77.52 kg (170 lb 14.4 oz)  02/23/15 77.565 kg (171 lb)     Past Medical History  Diagnosis Date  . DM2 (diabetes mellitus, type 2) (HCC)     Uncontrolled  . HTN (hypertension)   . Psoriasis   . Psoriatic arthritis ([place])   . H/O: hysterectomy   . Depression   . SOB (shortness of breath) on exertion   . Glaucoma   . Hypercholesterolemia   . Obesity   . Paroxysmal atrial fibrillation (HCC)   . Arthritis     scoriatic, rheumatoid    Current Outpatient Prescriptions  Medication Sig Dispense Refill  . acetaminophen (TYLENOL) 325 MG tablet Take 2 tablets (650 mg total) by mouth every 4 (four) hours as needed for headache or mild pain.    Marland Kitchen amLODipine (NORVASC) 10 MG tablet Take 10 mg by mouth daily.  0  . apixaban (ELIQUIS) 5 MG TABS tablet Take 1 tablet (5 mg total) by mouth 2 (two) times daily. 60 tablet 11  . atorvastatin (LIPITOR) 20 MG tablet Take 20 mg by mouth daily.     . Brimonidine Tartrate-Timolol (COMBIGAN OP) Place 1 drop into both eyes 2 (two) times daily.     . Ca Carbonate-Mag Hydroxide (ROLAIDS PO) Take 1-2 tablets by mouth daily as  needed (reflux).    . cloNIDine (CATAPRES) 0.2 MG tablet Take 3 tablets by mouth daily. Take 1 tab in the morning and 2 tab at night.  0  . furosemide (LASIX) 20 MG tablet Take 1 tablet (20 mg total) by mouth daily. 30 tablet 2  . HUMIRA 40 MG/0.8ML PSKT Inject 40 mg as directed every 14 (fourteen) days. Every other week  5  . JARDIANCE 10 MG TABS tablet Take 1 tablet by mouth daily.  0  . LANTUS SOLOSTAR 100 UNIT/ML Solostar Pen Inject 18 Units into the skin every morning.  3  . levothyroxine (SYNTHROID, LEVOTHROID) 25 MCG tablet Take 1 tablet by mouth daily with breakfast.    . NEXIUM 40 MG capsule Take 40  mg by mouth daily.     . predniSONE (DELTASONE) 5 MG tablet Take 10 mg by mouth as needed (flare up). 1 1/2 tablets as needed  0  . sertraline (ZOLOFT) 100 MG tablet Take 100 mg by mouth daily.     . travoprost, benzalkonium, (TRAVATAN) 0.004 % ophthalmic solution Place 1 drop into both eyes at bedtime.    . triamcinolone cream (KENALOG) 0.1 % Apply 1 application topically 2 (two) times daily.     No current facility-administered medications for this visit.    Allergies:    Allergies  Allergen Reactions  . Ace Inhibitors     dyspnea  . Codeine Itching  . Metoprolol Other (See Comments)    Hallucinations    Social History:  The patient  reports that she has never smoked. She has never used smokeless tobacco. She reports that she does not drink alcohol or use illicit drugs.   Family history:   Family History  Problem Relation Age of Onset  . Stroke Mother   . Heart attack Father   . Leukemia Paternal Uncle   . Diabetes Cousin   . Leukemia Cousin   . Heart disease Brother     ROS:  Please see the history of present illness.  All other systems reviewed and negative.   PHYSICAL EXAM: VS:  BP 122/66 mmHg  Pulse 66  Ht 4\' 9"  (1.448 m)  Wt 79.198 kg (174 lb 9.6 oz)  BMI 37.77 kg/m2 Obese, well developed, in no acute distress HEENT: Pupils are equal round react to light accommodation extraocular movements are intact.  Neck: no JVDNo cervical lymphadenopathy. Cardiac: Regular rate and rhythm without murmurs rubs or gallops. Lungs:  clear to auscultation bilaterally, no wheezing, rhonchi or rales Abd: soft, nontender, positive bowel sounds all quadrants, no hepatosplenomegaly Ext: no lower extremity edema.  2+ radial and dorsalis pedis pulses. Skin: warm and dry Neuro:  Grossly normal   ASSESSMENT AND PLAN:  Problem List Items Addressed This Visit    DM2 (diabetes mellitus, type 2) (HCC)   Relevant Medications   LANTUS SOLOSTAR 100 UNIT/ML Solostar Pen   JARDIANCE 10 MG  TABS tablet   HTN (hypertension)   Relevant Medications   cloNIDine (CATAPRES) 0.2 MG tablet   Hypercholesterolemia   Relevant Medications   cloNIDine (CATAPRES) 0.2 MG tablet   Atrial fibrillation (HCC) - Primary   Relevant Medications   cloNIDine (CATAPRES) 0.2 MG tablet       Near syncope. Extensive work up as noted. I suspect this is a combination of Afib and hypotension. Afib rate is controlled on no rate slowing medication. She is minimally symptomatic now. Need to avoid hypotension.   Essential hypertension: I am concerned with her taking such  a high dose of Clonidine especially with history of near syncope and orthostasis. Will reduce to 0.2 mg in the am and 0.4 mg in the pm. I would be happy if her blood pressure is less than 160. I think trying to achieve too tight a BP control will increase the risk of syncope.   Hyperlipidemia: Continue statin   Paroxysmal atrial fibrillation- continue rate control and Eliquis.     Follow-up in 4 months.

## 2015-04-05 NOTE — Patient Instructions (Signed)
Reduce Clonidine to 1 tablet in the morning and 2 tablets in the evening.  I will see you in 4-6  Months.

## 2015-04-12 DIAGNOSIS — H918X3 Other specified hearing loss, bilateral: Secondary | ICD-10-CM | POA: Diagnosis not present

## 2015-04-18 DIAGNOSIS — H2513 Age-related nuclear cataract, bilateral: Secondary | ICD-10-CM | POA: Diagnosis not present

## 2015-04-18 DIAGNOSIS — H409 Unspecified glaucoma: Secondary | ICD-10-CM | POA: Diagnosis not present

## 2015-05-10 DIAGNOSIS — R1011 Right upper quadrant pain: Secondary | ICD-10-CM | POA: Diagnosis not present

## 2015-05-11 ENCOUNTER — Other Ambulatory Visit: Payer: Self-pay | Admitting: Internal Medicine

## 2015-05-11 DIAGNOSIS — R1011 Right upper quadrant pain: Secondary | ICD-10-CM

## 2015-05-12 ENCOUNTER — Ambulatory Visit
Admission: RE | Admit: 2015-05-12 | Discharge: 2015-05-12 | Disposition: A | Discharge status: Home / Self Care | Payer: Medicare Other | Source: Ambulatory Visit | Attending: Internal Medicine | Admitting: Internal Medicine

## 2015-05-12 DIAGNOSIS — R1011 Right upper quadrant pain: Secondary | ICD-10-CM

## 2015-05-12 DIAGNOSIS — R1111 Vomiting without nausea: Secondary | ICD-10-CM | POA: Diagnosis not present

## 2015-05-31 DIAGNOSIS — M150 Primary generalized (osteo)arthritis: Secondary | ICD-10-CM | POA: Diagnosis not present

## 2015-05-31 DIAGNOSIS — L4050 Arthropathic psoriasis, unspecified: Secondary | ICD-10-CM | POA: Diagnosis not present

## 2015-05-31 DIAGNOSIS — M25539 Pain in unspecified wrist: Secondary | ICD-10-CM | POA: Diagnosis not present

## 2015-05-31 DIAGNOSIS — M5136 Other intervertebral disc degeneration, lumbar region: Secondary | ICD-10-CM | POA: Diagnosis not present

## 2015-08-23 ENCOUNTER — Other Ambulatory Visit: Payer: Self-pay | Admitting: Internal Medicine

## 2015-08-23 DIAGNOSIS — G43111 Migraine with aura, intractable, with status migrainosus: Secondary | ICD-10-CM

## 2015-08-23 DIAGNOSIS — G43109 Migraine with aura, not intractable, without status migrainosus: Secondary | ICD-10-CM | POA: Diagnosis not present

## 2015-08-26 ENCOUNTER — Ambulatory Visit
Admission: RE | Admit: 2015-08-26 | Discharge: 2015-08-26 | Disposition: A | Discharge status: Home / Self Care | Payer: Medicare Other | Source: Ambulatory Visit | Attending: Internal Medicine | Admitting: Internal Medicine

## 2015-08-26 DIAGNOSIS — G43111 Migraine with aura, intractable, with status migrainosus: Secondary | ICD-10-CM

## 2015-08-26 DIAGNOSIS — R51 Headache: Secondary | ICD-10-CM | POA: Diagnosis not present

## 2015-08-26 DIAGNOSIS — R55 Syncope and collapse: Secondary | ICD-10-CM | POA: Diagnosis not present

## 2015-08-29 ENCOUNTER — Telehealth: Payer: Self-pay | Admitting: Cardiology

## 2015-08-29 NOTE — Telephone Encounter (Signed)
Returned call to patient no answer.LMTC. 

## 2015-08-29 NOTE — Telephone Encounter (Signed)
New Message    Going to PCP. Sent for a brain scan hasn't heard anything from that and she feels like she will need to see Dr. Martinique over her PCP anyway.  Pt c/o BP issue:  1. What are your last 5 BP readings? She only has two readings  228/96  210/81 this morning   2. Are you having any other symptoms (ex. Dizziness, headache, blurred vision, passed out)? Yes. All of that about 1 week ago or so.  3. What is your medication issue? Pt is not sure. Pt states that she has doubled up on her medications but it has not come down. Request a call back to discuss.

## 2015-08-29 NOTE — Telephone Encounter (Signed)
New message ° ° ° ° °

## 2015-08-30 ENCOUNTER — Telehealth: Payer: Self-pay | Admitting: Neurology

## 2015-08-30 ENCOUNTER — Ambulatory Visit (INDEPENDENT_AMBULATORY_CARE_PROVIDER_SITE_OTHER): Payer: Medicare Other | Admitting: Cardiology

## 2015-08-30 ENCOUNTER — Encounter: Payer: Self-pay | Admitting: Cardiology

## 2015-08-30 VITALS — BP 164/80 | HR 48 | Ht <= 58 in | Wt 175.8 lb

## 2015-08-30 DIAGNOSIS — E1165 Type 2 diabetes mellitus with hyperglycemia: Secondary | ICD-10-CM | POA: Diagnosis not present

## 2015-08-30 DIAGNOSIS — R443 Hallucinations, unspecified: Secondary | ICD-10-CM

## 2015-08-30 DIAGNOSIS — I480 Paroxysmal atrial fibrillation: Secondary | ICD-10-CM

## 2015-08-30 DIAGNOSIS — E7800 Pure hypercholesterolemia, unspecified: Secondary | ICD-10-CM | POA: Diagnosis not present

## 2015-08-30 DIAGNOSIS — I10 Essential (primary) hypertension: Secondary | ICD-10-CM | POA: Diagnosis not present

## 2015-08-30 DIAGNOSIS — I951 Orthostatic hypotension: Secondary | ICD-10-CM

## 2015-08-30 DIAGNOSIS — Z794 Long term (current) use of insulin: Secondary | ICD-10-CM

## 2015-08-30 MED ORDER — FUROSEMIDE 20 MG PO TABS: 20.0000 mg | ORAL_TABLET | Freq: Every day | ORAL | Status: DC

## 2015-08-30 MED ORDER — AMLODIPINE BESYLATE 10 MG PO TABS: 10.0000 mg | ORAL_TABLET | Freq: Every day | ORAL | Status: DC

## 2015-08-30 NOTE — Progress Notes (Signed)
Patient ID: Christina Bishop, female   DOB: 08/28/1940, 75 y.o.   MRN: [id number]     Date:  08/30/2015   ID:  Christina Bishop, DOB 07/07/1940, MRN [id number]  PCP:  Thressa Sheller, MD  Primary Cardiologist:  [place]  Chief Complaint  Patient presents with  . Follow-up    PAF  pt states she got confused when driving, vision was blurry, felt like she had a fever--a week ago; AFIB has been worse--pain down left arm; pain on top of head; SOB on exertion; ankles stay swollen--has had a difficult time walking;BP has been elevated; confusion when talking occasionally--pt states she says something but means something else     History of Present Illness:  Christina Bishop is a 75 y.o. female with history of diabetes mellitus, hypertension, atrial fibrillation-CHADSVASC > 3, psoriatic arthritis, depression, hyperlipidemia and obesity. In the fall of 2015 she had a syncopal episode was found to be in atrial fibrillation and was started on metoprolol and Eliquis. Her echocardiogram at that time indicated an ejection fraction of 60-65% with normal wall motion. She developed hallucinations on metoprolol and this was stopped.   Patient was admitted to [facility] 02/14/2015 with chest pain and near syncope.  A Myoview study showed no reversible ischemia and ejection fraction of 68%. Echocardiogram  revealed an EF of 55-60% with grade 1 diastolic dysfunction.  Due to bradycardia, neither metoprolol nor diltiazem were added.   She had MRI of the brain which was negative for acute findings.  EEG of her brain was also unremarkable. Subsequent Event monitor showed episodes of AFib with controlled rate- longest 4 hrs and 40 minute. No significant tachy or bradycardia.   On follow up today she reports not feeling well for the past 4 months. Her husband passed and she moved to an apartment. She notes increased episodes of confusion. Has periods when she doesn't know where she is. Some of these episodes may  occur while driving and she ended up in the median on one occasion. Sometimes she can't speak right or find words. She has some lightheadedness or vision going blank. She notes imbalance with walking. She is having frequent hallucinations mostly of people who aren't there. She notes she is out of Norvasc and lasix and doesn't know how long she has been out. Her BP has been running high and sometimes she has taken up to 4 Clonidine tablets in one night. By her records BP ranges from 123456 systolic and 123456 diastolic. She does note periods of palpitations that may last several hours. Recent CT head was unremarkable. Blood sugar 150-160.   The patient currently denies nausea, vomiting, fever, chest pain, orthopnea,  PND, cough, congestion, abdominal pain, hematochezia, melena, lower extremity edema, claudication.   Wt Readings from Last 3 Encounters:  08/30/15 79.742 kg (175 lb 12.8 oz)  04/05/15 79.198 kg (174 lb 9.6 oz)  02/24/15 77.52 kg (170 lb 14.4 oz)     Past Medical History  Diagnosis Date  . DM2 (diabetes mellitus, type 2) (HCC)     Uncontrolled  . HTN (hypertension)   . Psoriasis   . Psoriatic arthritis ([place])   . H/O: hysterectomy   . Depression   . SOB (shortness of breath) on exertion   . Glaucoma   . Hypercholesterolemia   . Obesity   . Paroxysmal atrial fibrillation (HCC)   . Arthritis     scoriatic, rheumatoid    Current Outpatient Prescriptions  Medication Sig Dispense Refill  .  acetaminophen (TYLENOL) 325 MG tablet Take 2 tablets (650 mg total) by mouth every 4 (four) hours as needed for headache or mild pain.    Marland Kitchen amLODipine (NORVASC) 10 MG tablet Take 1 tablet (10 mg total) by mouth daily. 30 tablet 11  . apixaban (ELIQUIS) 5 MG TABS tablet Take 1 tablet (5 mg total) by mouth 2 (two) times daily. 60 tablet 11  . atorvastatin (LIPITOR) 20 MG tablet Take 20 mg by mouth daily.     . Brimonidine Tartrate-Timolol (COMBIGAN OP) Place 1 drop into both eyes 2 (two) times  daily.     . Ca Carbonate-Mag Hydroxide (ROLAIDS PO) Take 1-2 tablets by mouth daily as needed (reflux).    . cloNIDine (CATAPRES) 0.2 MG tablet Take 0.2 mg by mouth 2 (two) times daily.    . furosemide (LASIX) 20 MG tablet Take 1 tablet (20 mg total) by mouth daily. 30 tablet 11  . HUMIRA 40 MG/0.8ML PSKT Inject 40 mg as directed every 14 (fourteen) days. Every other week  5  . JARDIANCE 10 MG TABS tablet Take 1 tablet by mouth daily.  0  . LANTUS SOLOSTAR 100 UNIT/ML Solostar Pen Inject 18 Units into the skin every morning.  3  . levothyroxine (SYNTHROID, LEVOTHROID) 25 MCG tablet Take 1 tablet by mouth daily with breakfast.    . NEXIUM 40 MG capsule Take 40 mg by mouth daily.     . predniSONE (DELTASONE) 5 MG tablet Take 10 mg by mouth as needed (flare up). 1 1/2 tablets as needed  0  . sertraline (ZOLOFT) 100 MG tablet Take 100 mg by mouth daily.     . travoprost, benzalkonium, (TRAVATAN) 0.004 % ophthalmic solution Place 1 drop into both eyes at bedtime.    . triamcinolone cream (KENALOG) 0.1 % Apply 1 application topically 2 (two) times daily.     No current facility-administered medications for this visit.    Allergies:    Allergies  Allergen Reactions  . Ace Inhibitors     dyspnea  . Codeine Itching  . Metoprolol Other (See Comments)    Hallucinations    Social History:  The patient  reports that she has never smoked. She has never used smokeless tobacco. She reports that she does not drink alcohol or use illicit drugs.   Family history:   Family History  Problem Relation Age of Onset  . Stroke Mother   . Heart attack Father   . Leukemia Paternal Uncle   . Diabetes Cousin   . Leukemia Cousin   . Heart disease Brother     ROS:  Please see the history of present illness.  All other systems reviewed and negative.   PHYSICAL EXAM: VS:  BP 164/80 mmHg  Pulse 48  Ht 4\' 9"  (1.448 m)  Wt 79.742 kg (175 lb 12.8 oz)  BMI 38.03 kg/m2 Obese, well developed, in no acute  distress HEENT: Pupils are equal round react to light accommodation extraocular movements are intact.  Neck: no JVDNo cervical lymphadenopathy. Cardiac: Regular rate and rhythm without murmurs rubs or gallops. Lungs:  clear to auscultation bilaterally, no wheezing, rhonchi or rales Abd: soft, nontender, positive bowel sounds all quadrants, no hepatosplenomegaly Ext: no lower extremity edema.  2+ radial and dorsalis pedis pulses. Skin: warm and dry Neuro:  Grossly normal   ASSESSMENT AND PLAN:  Problem List Items Addressed This Visit    DM2 (diabetes mellitus, type 2) ([place])   HTN (hypertension)   Relevant Medications  cloNIDine (CATAPRES) 0.2 MG tablet   amLODipine (NORVASC) 10 MG tablet   furosemide (LASIX) 20 MG tablet   Hypercholesterolemia   Relevant Medications   cloNIDine (CATAPRES) 0.2 MG tablet   amLODipine (NORVASC) 10 MG tablet   furosemide (LASIX) 20 MG tablet   Atrial fibrillation (HCC) - Primary   Relevant Medications   cloNIDine (CATAPRES) 0.2 MG tablet   amLODipine (NORVASC) 10 MG tablet   furosemide (LASIX) 20 MG tablet   Orthostatic hypotension   Relevant Medications   cloNIDine (CATAPRES) 0.2 MG tablet   amLODipine (NORVASC) 10 MG tablet   furosemide (LASIX) 20 MG tablet       1. Near syncope- resolved. Extensive work up as noted. I suspect this is a combination of Afib and hypotension. Afib rate is controlled on no rate slowing medication. She is minimally symptomatic now. Need to avoid hypotension.   2. Essential hypertension- poorly controlled. I have recommended reducing Clonidine to twice a day and resume amlodipine 10 mg daily and lasix 20 mg daily. Multiple drug intolerances as noted. She has tolerated this regimen well in the past. We should try and avoid overly tight BP control given history of syncope/orthostatic hypotension.   3.Hyperlipidemia: Continue statin   4. Paroxysmal atrial fibrillation- continue rate control and Eliquis.   5.  Increased confusion, hallucinations, etc as noted in HPI. CT negative. Will check CBC, CMET, TSH, and lipids. I would recommend she follow up with Dr. Jannifer Franklin with Neurology who she has seen before. I have recommended that she no longer drive.     Follow-up in 4 weeks with extender.

## 2015-08-30 NOTE — Telephone Encounter (Signed)
Received call from patient.She stated her B/P has been elevated this morning 154/112.Stated she has appointment with Dr.Jordan this Thursday but would like to be seen sooner.Appointment scheduled with Dr.Jordan this afternoon at 1:30 pm.Advised to bring B/P readings.

## 2015-08-30 NOTE — Telephone Encounter (Signed)
Follow Up   Sent call to cheryl.

## 2015-08-30 NOTE — Patient Instructions (Addendum)
Reduce clonidine to twice a day  Resume amlodipine (Norvasc) 10 mg daily and lasix 20 mg daily  We will check blood work today.  I would like you to see Dr. Jannifer Franklin again   You are not to drive anymore

## 2015-08-30 NOTE — Telephone Encounter (Signed)
[facility] in Dr. Collier Salina Jordan's office is calling and needs an appointment for this patients as she states she is hallucinating and "seeing people in her house."  Please call patient.

## 2015-08-30 NOTE — Telephone Encounter (Signed)
Called pt to schedule her an appt with Dr. Jannifer Franklin. No answer, left a message asking her to call me back.

## 2015-08-31 NOTE — Telephone Encounter (Signed)
I called pt again to schedule her an appt. Left another message asking her to call me back.

## 2015-08-31 NOTE — Telephone Encounter (Signed)
I spoke to pt. She is refusing an appt with Dr. Jannifer Franklin at this time. She says she thinks her problems are related to high blood pressure and not taking her meds as she was supposed to. She will call us back if she wants an appt.  I reiterated to pt that Dr. [place] has recommended an appt with Dr. Jannifer Franklin, but pt again refused an appt. I asked pt to please call us back if she changes her mind. Pt verbalized understanding.

## 2015-09-01 ENCOUNTER — Ambulatory Visit: Payer: Medicare Other | Admitting: Cardiology

## 2015-09-07 NOTE — Telephone Encounter (Signed)
fyi - pt is refusing appt with dr Jannifer Franklin at the time.

## 2015-09-13 DIAGNOSIS — Z0000 Encounter for general adult medical examination without abnormal findings: Secondary | ICD-10-CM | POA: Diagnosis not present

## 2015-09-13 DIAGNOSIS — I10 Essential (primary) hypertension: Secondary | ICD-10-CM | POA: Diagnosis not present

## 2015-09-13 DIAGNOSIS — E119 Type 2 diabetes mellitus without complications: Secondary | ICD-10-CM | POA: Diagnosis not present

## 2015-09-13 DIAGNOSIS — Z1389 Encounter for screening for other disorder: Secondary | ICD-10-CM | POA: Diagnosis not present

## 2015-09-13 DIAGNOSIS — Z23 Encounter for immunization: Secondary | ICD-10-CM | POA: Diagnosis not present

## 2015-09-20 DIAGNOSIS — E1122 Type 2 diabetes mellitus with diabetic chronic kidney disease: Secondary | ICD-10-CM | POA: Diagnosis not present

## 2015-09-20 DIAGNOSIS — I129 Hypertensive chronic kidney disease with stage 1 through stage 4 chronic kidney disease, or unspecified chronic kidney disease: Secondary | ICD-10-CM | POA: Diagnosis not present

## 2015-09-20 DIAGNOSIS — N182 Chronic kidney disease, stage 2 (mild): Secondary | ICD-10-CM | POA: Diagnosis not present

## 2015-09-29 ENCOUNTER — Ambulatory Visit: Payer: Medicare Other | Admitting: Physician Assistant

## 2015-10-26 DIAGNOSIS — Z1231 Encounter for screening mammogram for malignant neoplasm of breast: Secondary | ICD-10-CM | POA: Diagnosis not present

## 2015-11-09 ENCOUNTER — Telehealth: Payer: Self-pay | Admitting: Cardiology

## 2015-11-09 NOTE — Telephone Encounter (Signed)
Spoke with patient and she needs PA for her Olivia Mackie [phone] ID # [id number] Approved through 06/24/16 PA [id number], spoke with Earleen Reaper Advised patient

## 2015-11-09 NOTE — Telephone Encounter (Signed)
New message ° ° ° ° °Returning a call to the nurse °

## 2015-12-13 DIAGNOSIS — E1122 Type 2 diabetes mellitus with diabetic chronic kidney disease: Secondary | ICD-10-CM | POA: Diagnosis not present

## 2015-12-13 DIAGNOSIS — I129 Hypertensive chronic kidney disease with stage 1 through stage 4 chronic kidney disease, or unspecified chronic kidney disease: Secondary | ICD-10-CM | POA: Diagnosis not present

## 2015-12-13 DIAGNOSIS — M8580 Other specified disorders of bone density and structure, unspecified site: Secondary | ICD-10-CM | POA: Diagnosis not present

## 2015-12-20 DIAGNOSIS — E1122 Type 2 diabetes mellitus with diabetic chronic kidney disease: Secondary | ICD-10-CM | POA: Diagnosis not present

## 2015-12-20 DIAGNOSIS — L4050 Arthropathic psoriasis, unspecified: Secondary | ICD-10-CM | POA: Diagnosis not present

## 2015-12-20 DIAGNOSIS — I129 Hypertensive chronic kidney disease with stage 1 through stage 4 chronic kidney disease, or unspecified chronic kidney disease: Secondary | ICD-10-CM | POA: Diagnosis not present

## 2016-01-03 DIAGNOSIS — M25539 Pain in unspecified wrist: Secondary | ICD-10-CM | POA: Diagnosis not present

## 2016-01-03 DIAGNOSIS — M5136 Other intervertebral disc degeneration, lumbar region: Secondary | ICD-10-CM | POA: Diagnosis not present

## 2016-01-03 DIAGNOSIS — L4050 Arthropathic psoriasis, unspecified: Secondary | ICD-10-CM | POA: Diagnosis not present

## 2016-01-03 DIAGNOSIS — L401 Generalized pustular psoriasis: Secondary | ICD-10-CM | POA: Diagnosis not present

## 2016-01-03 DIAGNOSIS — M150 Primary generalized (osteo)arthritis: Secondary | ICD-10-CM | POA: Diagnosis not present

## 2016-02-03 DIAGNOSIS — E119 Type 2 diabetes mellitus without complications: Secondary | ICD-10-CM | POA: Diagnosis not present

## 2016-02-09 DIAGNOSIS — H401122 Primary open-angle glaucoma, left eye, moderate stage: Secondary | ICD-10-CM | POA: Diagnosis not present

## 2016-02-09 DIAGNOSIS — E119 Type 2 diabetes mellitus without complications: Secondary | ICD-10-CM | POA: Diagnosis not present

## 2016-02-09 DIAGNOSIS — H401113 Primary open-angle glaucoma, right eye, severe stage: Secondary | ICD-10-CM | POA: Diagnosis not present

## 2016-02-09 DIAGNOSIS — H25813 Combined forms of age-related cataract, bilateral: Secondary | ICD-10-CM | POA: Diagnosis not present

## 2016-03-14 DIAGNOSIS — E1122 Type 2 diabetes mellitus with diabetic chronic kidney disease: Secondary | ICD-10-CM | POA: Diagnosis not present

## 2016-03-14 DIAGNOSIS — M8580 Other specified disorders of bone density and structure, unspecified site: Secondary | ICD-10-CM | POA: Diagnosis not present

## 2016-03-14 DIAGNOSIS — I129 Hypertensive chronic kidney disease with stage 1 through stage 4 chronic kidney disease, or unspecified chronic kidney disease: Secondary | ICD-10-CM | POA: Diagnosis not present

## 2016-03-14 DIAGNOSIS — L4050 Arthropathic psoriasis, unspecified: Secondary | ICD-10-CM | POA: Diagnosis not present

## 2016-03-26 DIAGNOSIS — H539 Unspecified visual disturbance: Secondary | ICD-10-CM | POA: Diagnosis not present

## 2016-03-27 DIAGNOSIS — E1122 Type 2 diabetes mellitus with diabetic chronic kidney disease: Secondary | ICD-10-CM | POA: Diagnosis not present

## 2016-03-27 DIAGNOSIS — I4891 Unspecified atrial fibrillation: Secondary | ICD-10-CM | POA: Diagnosis not present

## 2016-03-27 DIAGNOSIS — I129 Hypertensive chronic kidney disease with stage 1 through stage 4 chronic kidney disease, or unspecified chronic kidney disease: Secondary | ICD-10-CM | POA: Diagnosis not present

## 2016-04-10 ENCOUNTER — Other Ambulatory Visit: Payer: Self-pay | Admitting: Cardiology

## 2016-04-13 ENCOUNTER — Ambulatory Visit: Payer: Medicare Other | Admitting: Cardiology

## 2016-05-10 ENCOUNTER — Telehealth: Payer: Self-pay | Admitting: Cardiology

## 2016-05-10 NOTE — Telephone Encounter (Signed)
Pt would like to go on the generic for Eliquis(Apixaban),because of her insurance.

## 2016-05-10 NOTE — Telephone Encounter (Signed)
Returned call to patient.She stated her insurance will be changing first of year and they will no longer cover Eliquis,but will cover generic Eliquis.Advised generic Eliquis will not be out until 4 to 5 years.Stated she will call me back if she needs a letter for insurance.

## 2016-05-28 DIAGNOSIS — R42 Dizziness and giddiness: Secondary | ICD-10-CM | POA: Diagnosis not present

## 2016-05-28 DIAGNOSIS — R51 Headache: Secondary | ICD-10-CM | POA: Diagnosis not present

## 2016-05-28 DIAGNOSIS — H539 Unspecified visual disturbance: Secondary | ICD-10-CM | POA: Diagnosis not present

## 2016-05-29 ENCOUNTER — Other Ambulatory Visit: Payer: Self-pay | Admitting: Internal Medicine

## 2016-05-29 DIAGNOSIS — R42 Dizziness and giddiness: Secondary | ICD-10-CM

## 2016-05-29 DIAGNOSIS — R519 Headache, unspecified: Secondary | ICD-10-CM

## 2016-05-29 DIAGNOSIS — H539 Unspecified visual disturbance: Secondary | ICD-10-CM

## 2016-05-29 DIAGNOSIS — R51 Headache: Secondary | ICD-10-CM

## 2016-06-07 ENCOUNTER — Ambulatory Visit
Admission: RE | Admit: 2016-06-07 | Discharge: 2016-06-07 | Disposition: A | Discharge status: Home / Self Care | Payer: Medicare Other | Source: Ambulatory Visit | Attending: Internal Medicine | Admitting: Internal Medicine

## 2016-06-07 DIAGNOSIS — R55 Syncope and collapse: Secondary | ICD-10-CM | POA: Diagnosis not present

## 2016-06-07 DIAGNOSIS — R51 Headache: Secondary | ICD-10-CM

## 2016-06-07 DIAGNOSIS — I6523 Occlusion and stenosis of bilateral carotid arteries: Secondary | ICD-10-CM | POA: Diagnosis not present

## 2016-06-07 DIAGNOSIS — R519 Headache, unspecified: Secondary | ICD-10-CM

## 2016-06-07 DIAGNOSIS — R42 Dizziness and giddiness: Secondary | ICD-10-CM

## 2016-06-07 DIAGNOSIS — H539 Unspecified visual disturbance: Secondary | ICD-10-CM

## 2016-07-10 DIAGNOSIS — E1122 Type 2 diabetes mellitus with diabetic chronic kidney disease: Secondary | ICD-10-CM | POA: Diagnosis not present

## 2016-07-10 DIAGNOSIS — E039 Hypothyroidism, unspecified: Secondary | ICD-10-CM | POA: Diagnosis not present

## 2016-07-10 DIAGNOSIS — I4891 Unspecified atrial fibrillation: Secondary | ICD-10-CM | POA: Diagnosis not present

## 2016-07-10 DIAGNOSIS — K219 Gastro-esophageal reflux disease without esophagitis: Secondary | ICD-10-CM | POA: Diagnosis not present

## 2016-08-23 ENCOUNTER — Other Ambulatory Visit: Payer: Self-pay | Admitting: Cardiology

## 2016-08-24 NOTE — Telephone Encounter (Signed)
Rx(s) sent to pharmacy electronically.  

## 2016-09-21 ENCOUNTER — Other Ambulatory Visit: Payer: Self-pay | Admitting: Cardiology

## 2016-09-21 NOTE — Telephone Encounter (Signed)
Rx request sent to pharmacy.  

## 2016-09-23 IMAGING — DX DG CHEST 2V
2 series · 2 of 2 positions shown · non-contrast
Comparison: 01/12/2008

CLINICAL DATA: Vomiting, near syncope over past several days. Chest
pain.

EXAM:
CHEST  2 VIEW

[chest pa]
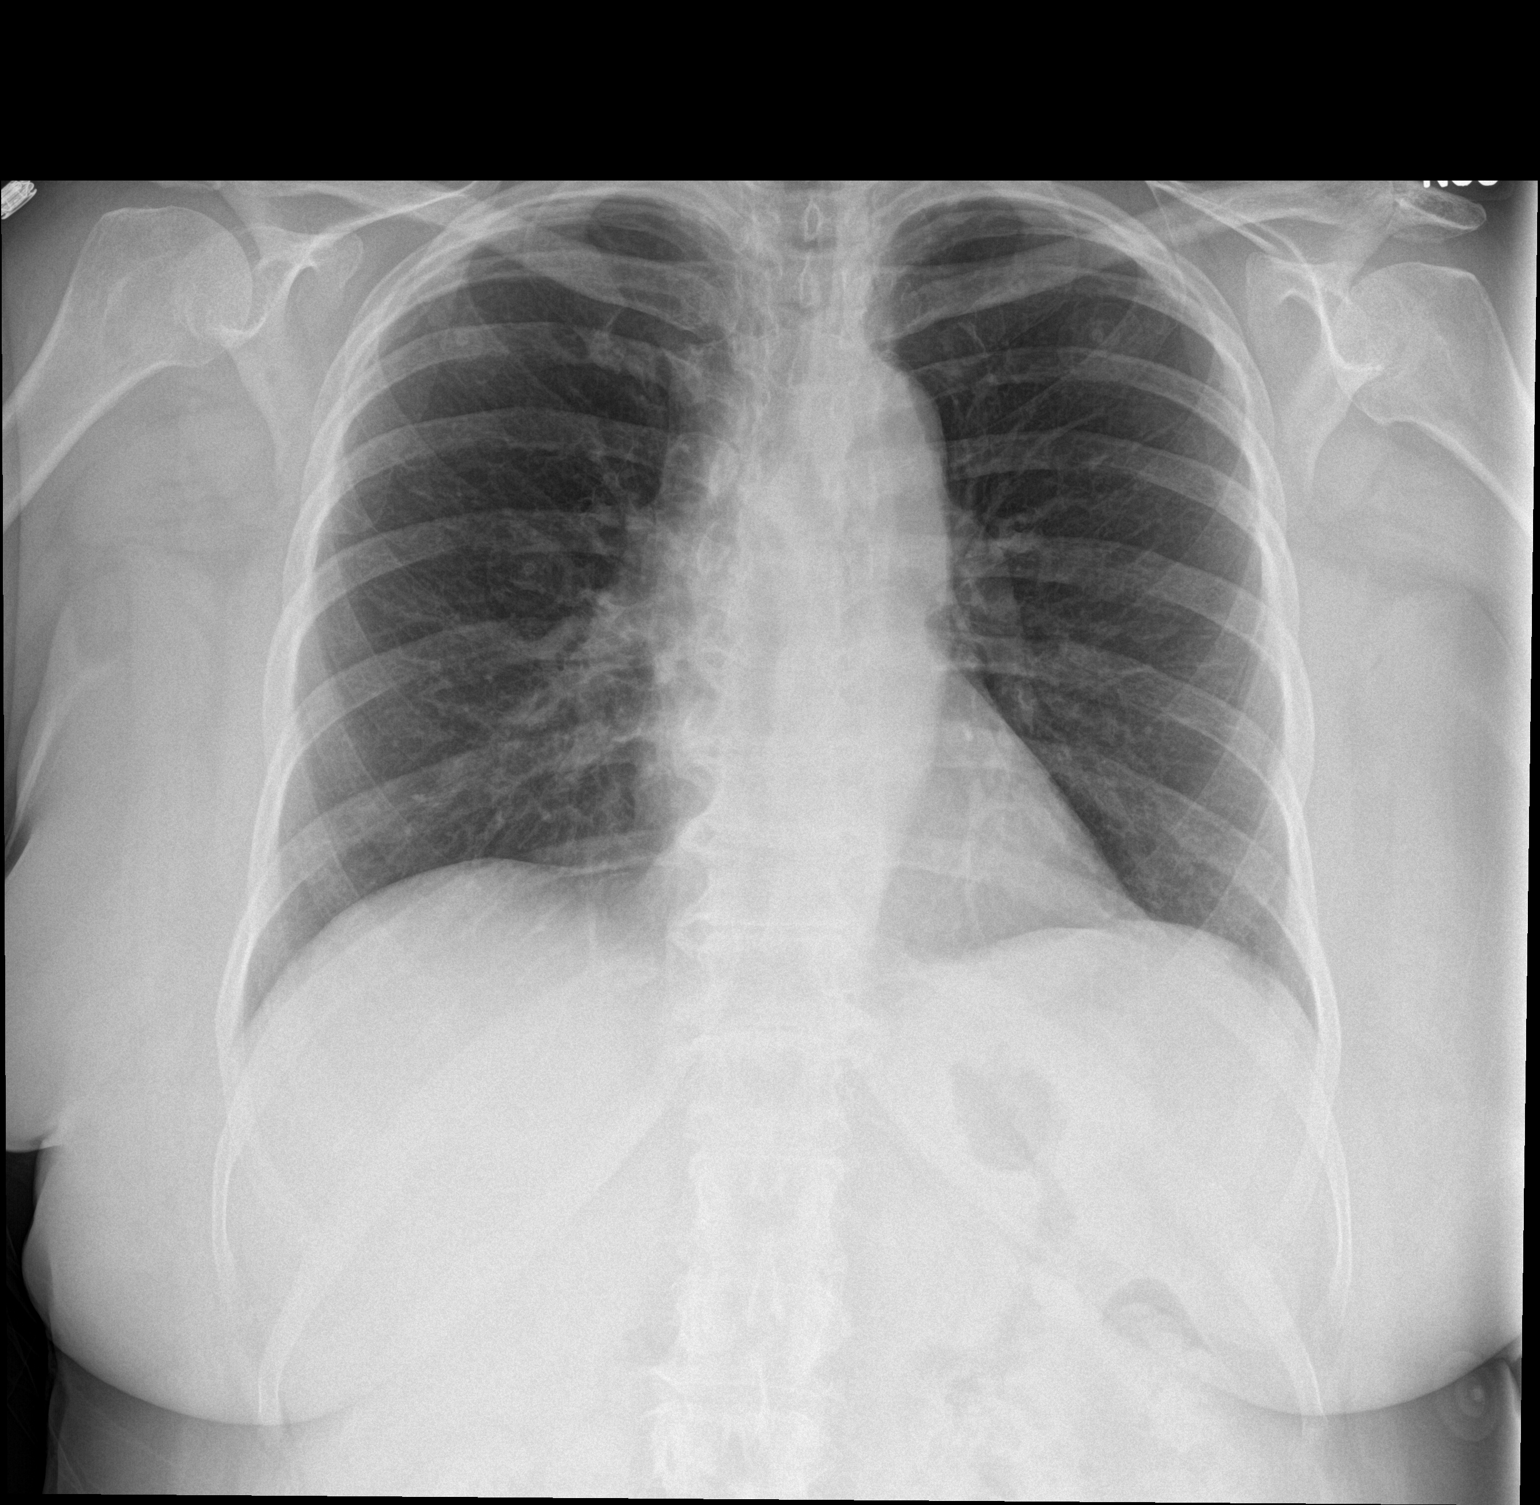

[chest lat]
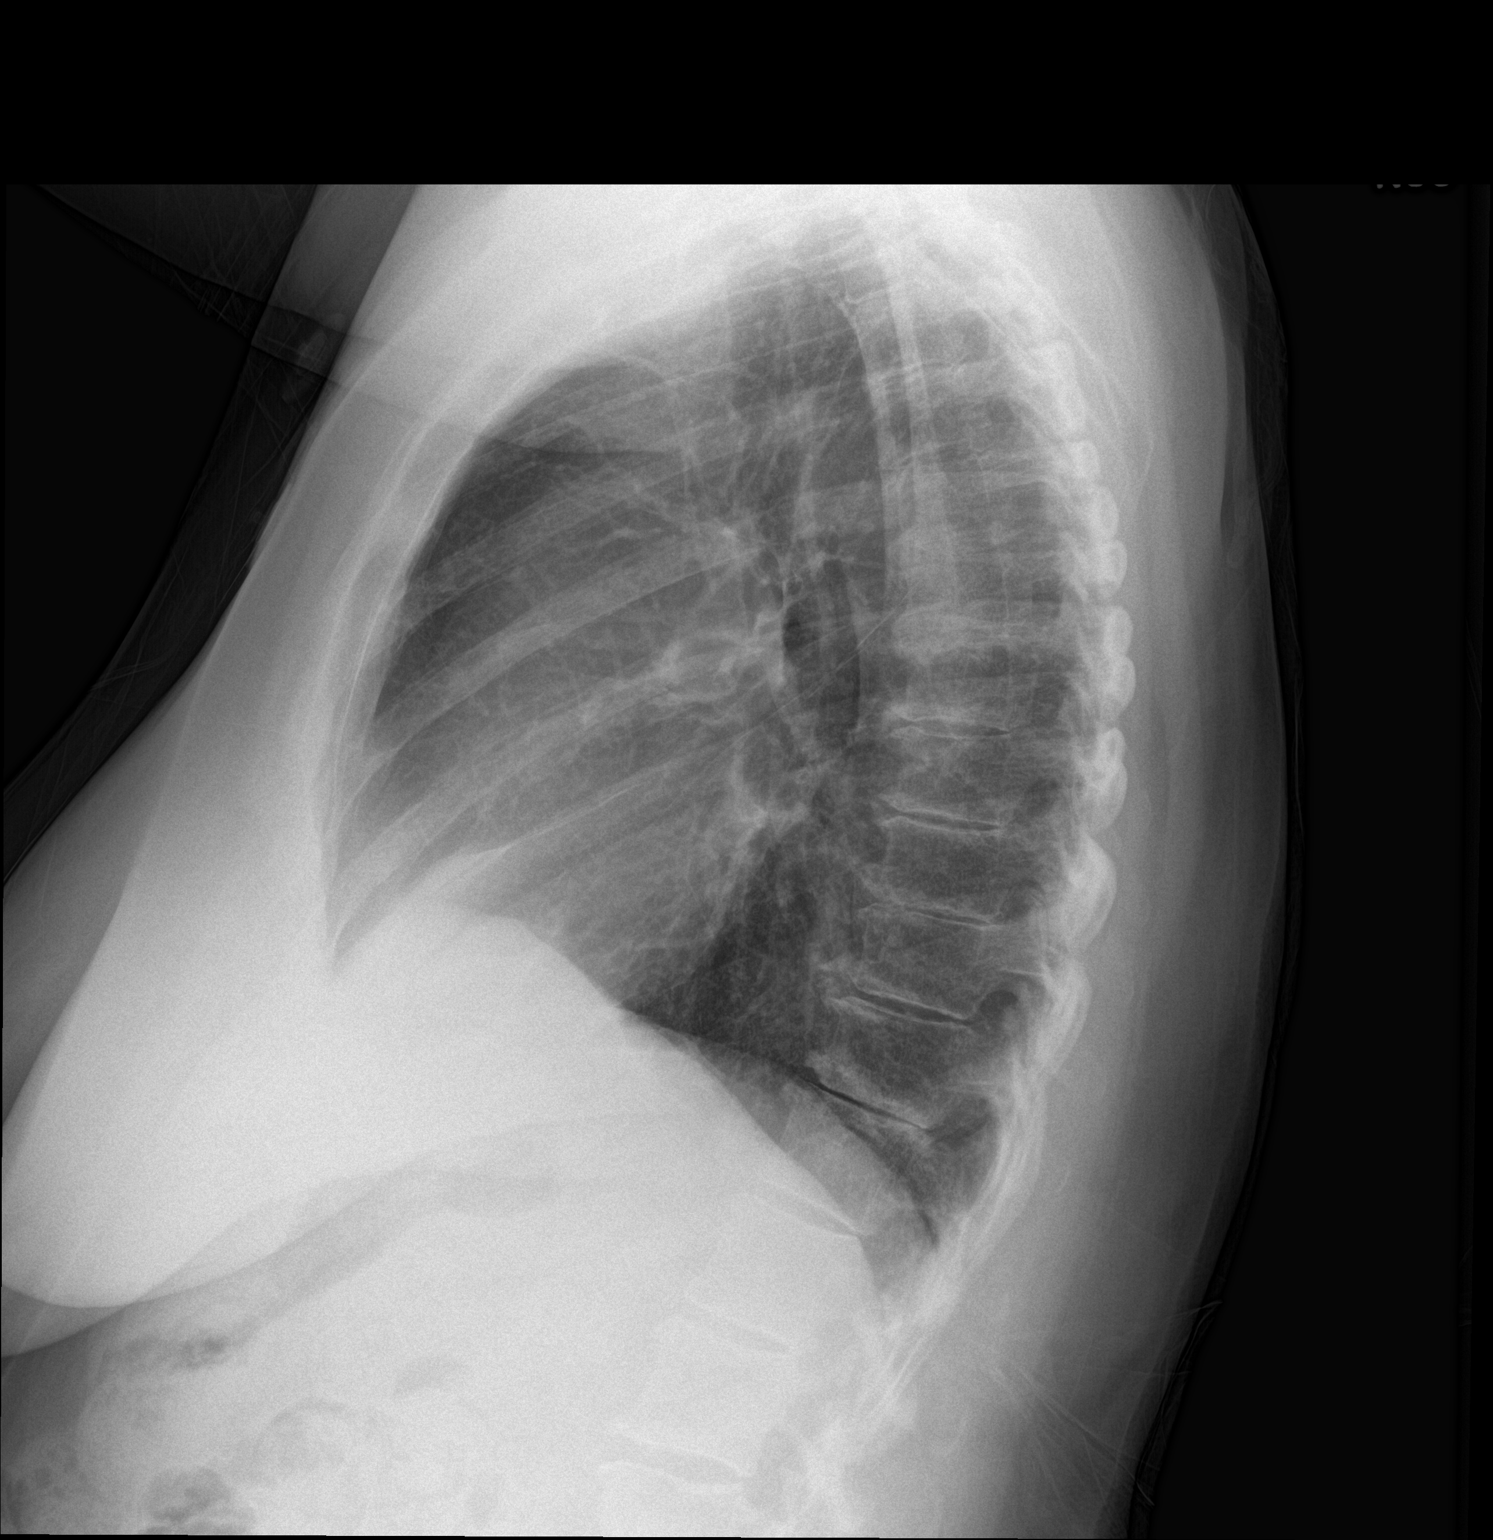

[2 of 2 positions shown; findings below may reference images not displayed]

FINDINGS: The heart size and mediastinal contours are within normal limits.
Both lungs are clear. The visualized skeletal structures are
unremarkable.
IMPRESSION: No active cardiopulmonary disease.

## 2016-09-25 DIAGNOSIS — E1122 Type 2 diabetes mellitus with diabetic chronic kidney disease: Secondary | ICD-10-CM | POA: Diagnosis not present

## 2016-09-25 DIAGNOSIS — M8580 Other specified disorders of bone density and structure, unspecified site: Secondary | ICD-10-CM | POA: Diagnosis not present

## 2016-09-25 DIAGNOSIS — I129 Hypertensive chronic kidney disease with stage 1 through stage 4 chronic kidney disease, or unspecified chronic kidney disease: Secondary | ICD-10-CM | POA: Diagnosis not present

## 2016-09-25 NOTE — Progress Notes (Addendum)
Patient ID: Christina Bishop, female   DOB: [birth year], 75 y.o.   MRN: [id number]     Date:  09/26/2016   ID:  Christina Bishop, DOB Jun 16, 1941, MRN [id number]  PCP:  Thressa Sheller, MD  Primary Cardiologist:  Glendora Clouatre [place] MD  Chief Complaint  Patient presents with  . Follow-up    pt c/o DOE  . Hypertension  . Atrial Fibrillation     History of Present Illness:  Christina Bishop is a 76 y.o. female with history of diabetes mellitus, hypertension, atrial fibrillation-CHADSVASC > 3, psoriatic arthritis, depression, hyperlipidemia and obesity. In the fall of 2015 she had a syncopal episode was found to be in atrial fibrillation and was started on metoprolol and Eliquis. Her echocardiogram at that time indicated an ejection fraction of 60-65% with normal wall motion. She developed hallucinations on metoprolol and this was stopped.   Patient was admitted to [facility] 02/14/2015 with chest pain and near syncope.  A Myoview study showed no reversible ischemia and ejection fraction of 68%. Echocardiogram  revealed an EF of 55-60% with grade 1 diastolic dysfunction.  Due to bradycardia, neither metoprolol nor diltiazem were added.   She had MRI of the brain which was negative for acute findings.  EEG of her brain was also unremarkable. Subsequent Event monitor showed episodes of AFib with controlled rate- longest 4 hrs and 40 minute. No significant tachy or bradycardia.   On her last visit she was complaining of confusion, forgetfulness, and hallucinations. Labs were unremarkable. Apparently this was all related to Humira and these symptoms resolved with stopping the medication. She has gained a lot of weight and has been eating a lot of Poptarts and drinking Pepsis. She does note that she is sleepy a lot and attributes this to Clonidine. She does note her heart goes out of rhythm frequently but not sustained. No dizziness or syncope. No chest pain or SOB.   Wt Readings from Last 3  Encounters:  09/26/16 187 lb (84.8 kg)  08/30/15 175 lb 12.8 oz (79.7 kg)  04/05/15 174 lb 9.6 oz (79.2 kg)     Past Medical History:  Diagnosis Date  . Arthritis    scoriatic, rheumatoid  . Depression   . DM2 (diabetes mellitus, type 2) (HCC)    Uncontrolled  . Glaucoma   . H/O: hysterectomy   . HTN (hypertension)   . Hypercholesterolemia   . Obesity   . Paroxysmal atrial fibrillation (HCC)   . Psoriasis   . Psoriatic arthritis ([place])   . SOB (shortness of breath) on exertion     Current Outpatient Prescriptions  Medication Sig Dispense Refill  . acetaminophen (TYLENOL) 325 MG tablet Take 2 tablets (650 mg total) by mouth every 4 (four) hours as needed for headache or mild pain.    Marland Kitchen amLODipine (NORVASC) 10 MG tablet Take 1 tablet (10 mg total) by mouth daily. 30 tablet 0  . atorvastatin (LIPITOR) 20 MG tablet Take 20 mg by mouth daily.     . Brimonidine Tartrate-Timolol (COMBIGAN OP) Place 1 drop into both eyes 2 (two) times daily.     . Ca Carbonate-Mag Hydroxide (ROLAIDS PO) Take 1-2 tablets by mouth daily as needed (reflux).    . cloNIDine (CATAPRES) 0.2 MG tablet Take 0.2 mg by mouth 2 (two) times daily.    Marland Kitchen ELIQUIS 5 MG TABS tablet TAKE 1 TABLET BY MOUTH 2 TIMES DAILY 60 tablet 0  . furosemide (LASIX) 20 MG tablet TAKE 1 TABLET  BY MOUTH EVERY DAY 30 tablet 6  . glimepiride (AMARYL) 1 MG tablet Take 1 mg by mouth daily with breakfast.    . LANTUS SOLOSTAR 100 UNIT/ML Solostar Pen Inject 20 Units into the skin every morning.   3  . NEXIUM 40 MG capsule Take 40 mg by mouth daily.     . sertraline (ZOLOFT) 100 MG tablet Take 100 mg by mouth daily.     . travoprost, benzalkonium, (TRAVATAN) 0.004 % ophthalmic solution Place 1 drop into both eyes at bedtime.    . triamcinolone cream (KENALOG) 0.1 % Apply 1 application topically 2 (two) times daily.     No current facility-administered medications for this visit.     Allergies:    Allergies  Allergen Reactions  . Ace  Inhibitors     dyspnea  . Codeine Itching  . Metoprolol Other (See Comments)    Hallucinations    Social History:  The patient  reports that she has never smoked. She has never used smokeless tobacco. She reports that she does not drink alcohol or use drugs.   Family history:   Family History  Problem Relation Age of Onset  . Stroke Mother   . Heart attack Father   . Heart disease Brother   . Leukemia Paternal Uncle   . Diabetes Cousin   . Leukemia Cousin     ROS:  Please see the history of present illness.  All other systems reviewed and negative.   PHYSICAL EXAM: VS:  BP 130/78   Pulse 68   Ht 4\' 8"  (1.422 m)   Wt 187 lb (84.8 kg)   BMI 41.92 kg/m  Obese, well developed, in no acute distress  HEENT: Pupils are equal round react to light accommodation extraocular movements are intact.  Neck: no JVD No cervical lymphadenopathy. Cardiac: Regular rate and rhythm without murmurs rubs or gallops.  Lungs:  clear to auscultation bilaterally, no wheezing, rhonchi or rales  Abd: soft, nontender, positive bowel sounds all quadrants, no hepatosplenomegaly  Ext: no lower extremity edema.  2+ radial and dorsalis pedis pulses. Skin: warm and dry  Neuro:  Grossly normal  Laboratory data: Dated 03/14/16: cholesterol 140, triglycerides 55, HDL 67, LDL 62. A1c 6.8%. CMET normal.  Ecg today shows NSR rate 68. LAFB, LVH, possible septal infarct age undetermined. I have personally reviewed and interpreted this study.   ASSESSMENT AND PLAN:  Problem List Items Addressed This Visit    DM2 (diabetes mellitus, type 2) ([place])   Relevant Medications   glimepiride (AMARYL) 1 MG tablet   HTN (hypertension)   Atrial fibrillation ([place]) - Primary   Relevant Orders   EKG 12-Lead     1. Near syncope- resolved. Extensive work up as noted above. I suspect this is a combination of Afib and hypotension. Afib rate is controlled on no rate slowing medication. She is minimally symptomatic now. Need to  avoid hypotension.  2. Essential hypertension- well  controlled. I have recommended reducing Clonidine to 0.1 mg in the am and 0.2 mg in the pm to try and minimize sleepiness.  Multiple drug intolerances as noted. She has tolerated this regimen well. We should try and avoid overly tight BP control given history of syncope/orthostatic hypotension.  3.Hyperlipidemia: Continue statin  4. Paroxysmal atrial fibrillation- continue rate control and Eliquis.   5. Intolerance to Humira with hallucinations and confusion.

## 2016-09-26 ENCOUNTER — Encounter: Payer: Self-pay | Admitting: Cardiology

## 2016-09-26 ENCOUNTER — Ambulatory Visit (INDEPENDENT_AMBULATORY_CARE_PROVIDER_SITE_OTHER): Payer: Medicare Other | Admitting: Cardiology

## 2016-09-26 ENCOUNTER — Other Ambulatory Visit: Payer: Self-pay

## 2016-09-26 VITALS — BP 130/78 | HR 68 | Ht <= 58 in | Wt 187.0 lb

## 2016-09-26 DIAGNOSIS — I480 Paroxysmal atrial fibrillation: Secondary | ICD-10-CM | POA: Diagnosis not present

## 2016-09-26 DIAGNOSIS — Z794 Long term (current) use of insulin: Secondary | ICD-10-CM

## 2016-09-26 DIAGNOSIS — E1165 Type 2 diabetes mellitus with hyperglycemia: Secondary | ICD-10-CM

## 2016-09-26 DIAGNOSIS — I10 Essential (primary) hypertension: Secondary | ICD-10-CM

## 2016-09-26 NOTE — Patient Instructions (Addendum)
Reduce Clonidine to one half table in the am 0.1 mg, and one tablet in the pm 0.2 mg  Continue your other medication  Try and exercise daily with walking and lose weight   I will see you in one year

## 2016-10-02 DIAGNOSIS — E039 Hypothyroidism, unspecified: Secondary | ICD-10-CM | POA: Diagnosis not present

## 2016-10-02 DIAGNOSIS — I4891 Unspecified atrial fibrillation: Secondary | ICD-10-CM | POA: Diagnosis not present

## 2016-10-02 DIAGNOSIS — E1122 Type 2 diabetes mellitus with diabetic chronic kidney disease: Secondary | ICD-10-CM | POA: Diagnosis not present

## 2016-10-02 DIAGNOSIS — Z0000 Encounter for general adult medical examination without abnormal findings: Secondary | ICD-10-CM | POA: Diagnosis not present

## 2016-10-24 ENCOUNTER — Other Ambulatory Visit: Payer: Self-pay | Admitting: Cardiology

## 2016-10-24 NOTE — Telephone Encounter (Signed)
REFILL 

## 2016-11-10 ENCOUNTER — Other Ambulatory Visit: Payer: Self-pay | Admitting: Cardiology

## 2016-11-12 NOTE — Telephone Encounter (Signed)
Needs BMP and CBC

## 2016-11-14 ENCOUNTER — Other Ambulatory Visit: Payer: Self-pay | Admitting: Pharmacist

## 2016-11-14 MED ORDER — APIXABAN 5 MG PO TABS: 5.0000 mg | ORAL_TABLET | Freq: Two times a day (BID) | ORAL | 1 refills | Status: DC

## 2016-12-05 ENCOUNTER — Encounter: Payer: Self-pay | Admitting: Cardiology

## 2016-12-05 DIAGNOSIS — Z1231 Encounter for screening mammogram for malignant neoplasm of breast: Secondary | ICD-10-CM | POA: Diagnosis not present

## 2016-12-27 DIAGNOSIS — E1122 Type 2 diabetes mellitus with diabetic chronic kidney disease: Secondary | ICD-10-CM | POA: Diagnosis not present

## 2017-01-02 DIAGNOSIS — I129 Hypertensive chronic kidney disease with stage 1 through stage 4 chronic kidney disease, or unspecified chronic kidney disease: Secondary | ICD-10-CM | POA: Diagnosis not present

## 2017-01-02 DIAGNOSIS — E1122 Type 2 diabetes mellitus with diabetic chronic kidney disease: Secondary | ICD-10-CM | POA: Diagnosis not present

## 2017-01-24 ENCOUNTER — Telehealth: Payer: Self-pay | Admitting: Cardiology

## 2017-01-24 NOTE — Telephone Encounter (Signed)
New message     Pt is calling. She called about one of her medications is $1000 for 90 days.   Pt c/o medication issue:  1. Name of Medication: clonidine 0.2 mg  2. How are you currently taking this medication (dosage and times per day)? Twice a day  3. Are you having a reaction (difficulty breathing--STAT)? No   4. What is your medication issue? Pt states this medication is very expensive. She needs an alternative or a tier exemption. Please call.

## 2017-01-25 NOTE — Telephone Encounter (Signed)
Returned call to patient 01/24/17.She stated she wanted Dr.Jordan to prescribe another medication for B/P.Stated clonidine too expensive price has gone up to 1800 every month for 3 months then the price will drop.Stated she cannot afford.Advised Dr.Jordan out of office, I will send message to him.

## 2017-01-28 ENCOUNTER — Other Ambulatory Visit: Payer: Self-pay | Admitting: *Deleted

## 2017-01-28 MED ORDER — CLONIDINE HCL 0.2 MG PO TABS: 0.2000 mg | ORAL_TABLET | Freq: Two times a day (BID) | ORAL | 3 refills | Status: DC

## 2017-01-28 MED ORDER — AMLODIPINE BESYLATE 10 MG PO TABS: 10.0000 mg | ORAL_TABLET | Freq: Every day | ORAL | 2 refills | Status: DC

## 2017-01-28 MED ORDER — FUROSEMIDE 20 MG PO TABS: 20.0000 mg | ORAL_TABLET | Freq: Every day | ORAL | 2 refills | Status: DC

## 2017-01-28 MED ORDER — ATORVASTATIN CALCIUM 20 MG PO TABS: 20.0000 mg | ORAL_TABLET | Freq: Every day | ORAL | 2 refills | Status: DC

## 2017-01-28 NOTE — Telephone Encounter (Signed)
Spoke to patient.She stated she spoke to insurance this morning and someone quoted her the wrong price.Stated 90 day supply for Clonidine is only $5, refill sent to [facility].

## 2017-01-28 NOTE — Telephone Encounter (Signed)
I am surprised that clonidine would be so expensive. It is generic and has been around for years. I would ask pharmacy about this. If we can't resolved the cost issue then I would try switching to hydralazine- a comparable dose would be 50 mg tid. She would need to monitor BP closely.  Delynn Pursley [place] MD, [facility]

## 2017-01-28 NOTE — Telephone Encounter (Signed)
This is a response to an [facility] new prescription request (change of dispensing pharmacy). Authorizations sent electronically for patient's cardiac meds.

## 2017-01-31 ENCOUNTER — Other Ambulatory Visit: Payer: Self-pay

## 2017-01-31 MED ORDER — ESOMEPRAZOLE MAGNESIUM 40 MG PO CPDR: 40.0000 mg | DELAYED_RELEASE_CAPSULE | Freq: Every day | ORAL | 3 refills | Status: DC

## 2017-02-26 ENCOUNTER — Other Ambulatory Visit: Payer: Self-pay

## 2017-02-26 MED ORDER — APIXABAN 5 MG PO TABS: 5.0000 mg | ORAL_TABLET | Freq: Two times a day (BID) | ORAL | 2 refills | Status: DC

## 2017-02-28 ENCOUNTER — Other Ambulatory Visit: Payer: Self-pay

## 2017-02-28 MED ORDER — APIXABAN 5 MG PO TABS: 5.0000 mg | ORAL_TABLET | Freq: Two times a day (BID) | ORAL | 3 refills | Status: DC

## 2017-03-06 ENCOUNTER — Telehealth: Payer: Self-pay | Admitting: Cardiology

## 2017-03-06 NOTE — Telephone Encounter (Signed)
Samples at the front desk Pt notified 

## 2017-03-06 NOTE — Telephone Encounter (Signed)
pt calling to see if she can change from Eliquis to something comparable , it went from 54 a month to over 150 a month-pls advise [phone]

## 2017-03-20 ENCOUNTER — Telehealth: Payer: Self-pay | Admitting: Cardiology

## 2017-03-20 NOTE — Telephone Encounter (Signed)
Patient calling the office for samples of medication: ° ° °1.  What medication and dosage are you requesting samples for? Eliquis ° °2.  Are you currently out of this medication?  ° ° °

## 2017-03-20 NOTE — Telephone Encounter (Signed)
Returned call to patient she stated she cannot afford eliquis.Advised scheduler will call back with appointment to see pharmacist to change to coumadin.Advised I will leave eliquis 5 mg samples at front desk enough to last until appointment.

## 2017-04-02 ENCOUNTER — Telehealth: Payer: Self-pay | Admitting: Cardiology

## 2017-04-02 NOTE — Telephone Encounter (Signed)
New message   Patient states she does NOT want to be on Coumadin. Cancelled coumadin appt.  She wants to take Eliquis. Has concerns about the cost.   Pt c/o medication issue:  1. Name of Medication:apixaban (ELIQUIS) 5 MG TABS tablet  2. How are you currently taking this medication (dosage and times per day)? Take 1 tablet (5 mg total) by mouth 2 (two) times daily.  3. Are you having a reaction (difficulty breathing--STAT)? no  4. What is your medication issue? Patients wants to stay on ELIQUIS.

## 2017-04-03 NOTE — Telephone Encounter (Signed)
Patient refused warfarin; Please, complete patient assistant form.  If unable to get patient assistant ,we can transition to another NOAC but will have co-pay with all new agents.

## 2017-04-03 NOTE — Telephone Encounter (Signed)
Left message, does she have computer? Go to [email or web address]

## 2017-04-08 NOTE — Telephone Encounter (Signed)
Lm2cb 

## 2017-04-09 NOTE — Telephone Encounter (Signed)
No pt call back, will await pt to call back several attempts made

## 2017-04-12 NOTE — Telephone Encounter (Signed)
Called patient no answer.LMTC. 

## 2017-04-18 NOTE — Telephone Encounter (Signed)
Patient never returned call  

## 2017-05-14 ENCOUNTER — Other Ambulatory Visit: Payer: Self-pay | Admitting: Cardiology

## 2017-05-14 NOTE — Telephone Encounter (Signed)
Advised patient no samples at this time, call back anytime

## 2017-05-14 NOTE — Telephone Encounter (Signed)
Patient calling the office for samples of medication: ° ° °1.  What medication and dosage are you requesting samples for? °Eliquis 5 mg  °2.  Are you currently out of this medication?  °Yes ° ° ° °

## 2017-05-24 DIAGNOSIS — I10 Essential (primary) hypertension: Secondary | ICD-10-CM | POA: Diagnosis not present

## 2017-05-24 DIAGNOSIS — I480 Paroxysmal atrial fibrillation: Secondary | ICD-10-CM | POA: Diagnosis not present

## 2017-05-24 DIAGNOSIS — Z1389 Encounter for screening for other disorder: Secondary | ICD-10-CM | POA: Diagnosis not present

## 2017-05-24 DIAGNOSIS — E7849 Other hyperlipidemia: Secondary | ICD-10-CM | POA: Diagnosis not present

## 2017-05-24 DIAGNOSIS — E119 Type 2 diabetes mellitus without complications: Secondary | ICD-10-CM | POA: Diagnosis not present

## 2017-05-24 DIAGNOSIS — R05 Cough: Secondary | ICD-10-CM | POA: Diagnosis not present

## 2017-05-24 DIAGNOSIS — E559 Vitamin D deficiency, unspecified: Secondary | ICD-10-CM | POA: Diagnosis not present

## 2017-07-17 DIAGNOSIS — H5203 Hypermetropia, bilateral: Secondary | ICD-10-CM | POA: Diagnosis not present

## 2017-08-12 DIAGNOSIS — F3289 Other specified depressive episodes: Secondary | ICD-10-CM | POA: Diagnosis not present

## 2017-08-12 DIAGNOSIS — F329 Major depressive disorder, single episode, unspecified: Secondary | ICD-10-CM | POA: Diagnosis not present

## 2017-08-12 DIAGNOSIS — I779 Disorder of arteries and arterioles, unspecified: Secondary | ICD-10-CM | POA: Diagnosis not present

## 2017-08-12 DIAGNOSIS — Z1389 Encounter for screening for other disorder: Secondary | ICD-10-CM | POA: Diagnosis not present

## 2017-08-12 DIAGNOSIS — I480 Paroxysmal atrial fibrillation: Secondary | ICD-10-CM | POA: Diagnosis not present

## 2017-08-12 DIAGNOSIS — E119 Type 2 diabetes mellitus without complications: Secondary | ICD-10-CM | POA: Diagnosis not present

## 2017-08-12 DIAGNOSIS — I10 Essential (primary) hypertension: Secondary | ICD-10-CM | POA: Diagnosis not present

## 2017-08-12 DIAGNOSIS — L4050 Arthropathic psoriasis, unspecified: Secondary | ICD-10-CM | POA: Diagnosis not present

## 2017-08-12 DIAGNOSIS — E559 Vitamin D deficiency, unspecified: Secondary | ICD-10-CM | POA: Diagnosis not present

## 2017-10-15 DIAGNOSIS — I10 Essential (primary) hypertension: Secondary | ICD-10-CM | POA: Diagnosis not present

## 2017-10-15 DIAGNOSIS — H2513 Age-related nuclear cataract, bilateral: Secondary | ICD-10-CM | POA: Diagnosis not present

## 2017-10-15 DIAGNOSIS — H401132 Primary open-angle glaucoma, bilateral, moderate stage: Secondary | ICD-10-CM | POA: Diagnosis not present

## 2017-10-15 DIAGNOSIS — H25013 Cortical age-related cataract, bilateral: Secondary | ICD-10-CM | POA: Diagnosis not present

## 2017-10-15 DIAGNOSIS — H25043 Posterior subcapsular polar age-related cataract, bilateral: Secondary | ICD-10-CM | POA: Diagnosis not present

## 2017-10-15 DIAGNOSIS — H2511 Age-related nuclear cataract, right eye: Secondary | ICD-10-CM | POA: Diagnosis not present

## 2017-11-04 DIAGNOSIS — H2511 Age-related nuclear cataract, right eye: Secondary | ICD-10-CM | POA: Diagnosis not present

## 2017-11-05 DIAGNOSIS — Z961 Presence of intraocular lens: Secondary | ICD-10-CM | POA: Diagnosis not present

## 2017-11-05 DIAGNOSIS — H2512 Age-related nuclear cataract, left eye: Secondary | ICD-10-CM | POA: Diagnosis not present

## 2017-11-22 DIAGNOSIS — H2512 Age-related nuclear cataract, left eye: Secondary | ICD-10-CM | POA: Diagnosis not present

## 2017-12-16 DIAGNOSIS — R609 Edema, unspecified: Secondary | ICD-10-CM | POA: Diagnosis not present

## 2017-12-16 DIAGNOSIS — Z794 Long term (current) use of insulin: Secondary | ICD-10-CM | POA: Diagnosis not present

## 2017-12-16 DIAGNOSIS — I480 Paroxysmal atrial fibrillation: Secondary | ICD-10-CM | POA: Diagnosis not present

## 2017-12-16 DIAGNOSIS — Z1231 Encounter for screening mammogram for malignant neoplasm of breast: Secondary | ICD-10-CM | POA: Diagnosis not present

## 2017-12-16 DIAGNOSIS — E7849 Other hyperlipidemia: Secondary | ICD-10-CM | POA: Diagnosis not present

## 2017-12-16 DIAGNOSIS — E559 Vitamin D deficiency, unspecified: Secondary | ICD-10-CM | POA: Diagnosis not present

## 2017-12-16 DIAGNOSIS — E668 Other obesity: Secondary | ICD-10-CM | POA: Diagnosis not present

## 2017-12-16 DIAGNOSIS — E1159 Type 2 diabetes mellitus with other circulatory complications: Secondary | ICD-10-CM | POA: Diagnosis not present

## 2017-12-16 DIAGNOSIS — I5189 Other ill-defined heart diseases: Secondary | ICD-10-CM | POA: Diagnosis not present

## 2017-12-16 DIAGNOSIS — N183 Chronic kidney disease, stage 3 (moderate): Secondary | ICD-10-CM | POA: Diagnosis not present

## 2017-12-17 ENCOUNTER — Telehealth: Payer: Self-pay | Admitting: Cardiology

## 2017-12-17 NOTE — Telephone Encounter (Signed)
New Message:       Pt is requesting to move from [place] to [place] since he will be in [place] and she lives in [place].

## 2017-12-17 NOTE — Telephone Encounter (Signed)
That is fine with me  Christina Halvorsen MD, FACC   

## 2017-12-30 NOTE — Telephone Encounter (Signed)
No problem.

## 2017-12-30 NOTE — Telephone Encounter (Signed)
New Message:      Pt is calling back to see if she can be taken on as a pt of Dr. Agustin Cree from Dr. [place]. Dr. [place] has stated that will be fine with him.

## 2017-12-31 ENCOUNTER — Telehealth: Payer: Self-pay | Admitting: Cardiology

## 2017-12-31 NOTE — Telephone Encounter (Signed)
New message:      Pt is calling and just switched from [place] to [place]. Pt is wanting to start coumadin so that she can come off of her Eliquis because she can't keep paying for it. She states she has enough to get through this month but is needing a appt in Aug when schedule opens for Dr. Raliegh Ip

## 2017-12-31 NOTE — Telephone Encounter (Signed)
Patient has been added to a re-call list per Lattie Haw to be called as soon as the August schedule is opened. Will discuss coumadin with Agustin Cree.

## 2018-01-06 DIAGNOSIS — Z6841 Body Mass Index (BMI) 40.0 and over, adult: Secondary | ICD-10-CM | POA: Diagnosis not present

## 2018-01-06 DIAGNOSIS — I10 Essential (primary) hypertension: Secondary | ICD-10-CM | POA: Diagnosis not present

## 2018-01-06 DIAGNOSIS — E1165 Type 2 diabetes mellitus with hyperglycemia: Secondary | ICD-10-CM | POA: Diagnosis not present

## 2018-01-06 NOTE — Telephone Encounter (Signed)
-----   Message from Christina Bishop sent at 01/03/2018 12:06 PM EDT ----- Regarding: toc call [place] 01/03/2018

## 2018-01-06 NOTE — Telephone Encounter (Signed)
Left voicemail to discuss TOC.

## 2018-01-28 ENCOUNTER — Ambulatory Visit: Payer: Medicare HMO | Admitting: Cardiology

## 2018-01-28 ENCOUNTER — Encounter: Payer: Self-pay | Admitting: Cardiology

## 2018-01-28 VITALS — BP 160/100 | HR 78 | Ht <= 58 in | Wt 191.0 lb

## 2018-01-28 DIAGNOSIS — E7800 Pure hypercholesterolemia, unspecified: Secondary | ICD-10-CM | POA: Diagnosis not present

## 2018-01-28 DIAGNOSIS — I10 Essential (primary) hypertension: Secondary | ICD-10-CM

## 2018-01-28 DIAGNOSIS — E1165 Type 2 diabetes mellitus with hyperglycemia: Secondary | ICD-10-CM

## 2018-01-28 DIAGNOSIS — I951 Orthostatic hypotension: Secondary | ICD-10-CM | POA: Diagnosis not present

## 2018-01-28 DIAGNOSIS — Z794 Long term (current) use of insulin: Secondary | ICD-10-CM

## 2018-01-28 DIAGNOSIS — I480 Paroxysmal atrial fibrillation: Secondary | ICD-10-CM | POA: Diagnosis not present

## 2018-01-28 NOTE — Patient Instructions (Signed)
Medication Instructions:  Your physician recommends that you continue on your current medications as directed. Please refer to the Current Medication list given to you today.   Labwork: None.  Testing/Procedures: Your physician has requested that you have an echocardiogram. Echocardiography is a painless test that uses sound waves to create images of your heart. It provides your doctor with information about the size and shape of your heart and how well your heart's chambers and valves are working. This procedure takes approximately one hour. There are no restrictions for this procedure.    Follow-Up: Your physician recommends that you schedule a follow-up appointment in: 2 months.    Any Other Special Instructions Will Be Listed Below (If Applicable).      If you need a refill on your cardiac medications before your next appointment, please call your pharmacy.   Echocardiogram An echocardiogram, or echocardiography, uses sound waves (ultrasound) to produce an image of your heart. The echocardiogram is simple, painless, obtained within a short period of time, and offers valuable information to your health care provider. The images from an echocardiogram can provide information such as:  Evidence of coronary artery disease (CAD).  Heart size.  Heart muscle function.  Heart valve function.  Aneurysm detection.  Evidence of a past heart attack.  Fluid buildup around the heart.  Heart muscle thickening.  Assess heart valve function.  Tell a health care provider about:  Any allergies you have.  All medicines you are taking, including vitamins, herbs, eye drops, creams, and over-the-counter medicines.  Any problems you or family members have had with anesthetic medicines.  Any blood disorders you have.  Any surgeries you have had.  Any medical conditions you have.  Whether you are pregnant or may be pregnant. What happens before the procedure? No special  preparation is needed. Eat and drink normally. What happens during the procedure?  In order to produce an image of your heart, gel will be applied to your chest and a wand-like tool (transducer) will be moved over your chest. The gel will help transmit the sound waves from the transducer. The sound waves will harmlessly bounce off your heart to allow the heart images to be captured in real-time motion. These images will then be recorded.  You may need an IV to receive a medicine that improves the quality of the pictures. What happens after the procedure? You may return to your normal schedule including diet, activities, and medicines, unless your health care provider tells you otherwise. This information is not intended to replace advice given to you by your health care provider. Make sure you discuss any questions you have with your health care provider. Document Released: 06/08/2000 Document Revised: 01/28/2016 Document Reviewed: 02/16/2013 Elsevier Interactive Patient Education  2017 Elsevier Inc.    

## 2018-01-28 NOTE — Progress Notes (Signed)
Cardiology Office Note:    Date:  01/28/2018   ID:  Christina Bishop, DOB [birth year], MRN [id number]  PCP:  Townsend Roger, MD  Cardiologist:  Jenne Campus, MD    Referring MD: Thressa Sheller, MD   Chief Complaint  Patient presents with  . Medication Management    Wants to chg from Eliquis to Coumadin  I am doing well  History of Present Illness:    Christina Bishop is a 77 y.o. female ([place]) with complex past medical history.  She does have paroxysmal atrial fibrillation.  Her chads 2 Vascor equals 4 she is anticoagulated with Eliquis but have difficulty affording that medication she is very upset with herself because she gained significant amount of weight she admits that she does do many wrong thing in terms of eating and drinking she drinks [employer] eats a lot of sweet food.  She gained significant amount of weight and her diabetes appears to be worse in terms of controlling it.  Described to have rare palpitations typically at evening time lasting for a few minutes only no dizziness or syncope.  I did review her record very carefully and apparently few years ago she got syncope she was admitted to hospital extensive evaluation has been done it was unrevealing and conclusion was that was probably related to atrial fibrillation and orthostatic hypotension.  Since that time there is no more syncope.  She told me that she had difficulty moving around because of pain in her legs and just simply not willing to exercise much.  She does snore at night and we initiated conversation about potentially doing a sleep study.  Past Medical History:  Diagnosis Date  . Arthritis    scoriatic, rheumatoid  . Depression   . DM2 (diabetes mellitus, type 2) (HCC)    Uncontrolled  . Glaucoma   . H/O: hysterectomy   . HTN (hypertension)   . Hypercholesterolemia   . Obesity   . Paroxysmal atrial fibrillation (HCC)   . Psoriasis   . Psoriatic arthritis ([place])   . SOB (shortness of breath) on  exertion     Past Surgical History:  Procedure Laterality Date  . ABDOMINAL HYSTERECTOMY    . ABDOMINAL SURGERY    . APPENDECTOMY    . CARDIAC CATHETERIZATION    . JOINT REPLACEMENT     Both Knees  . TOTAL KNEE ARTHROPLASTY    . TUBAL LIGATION      Current Medications: Current Meds  Medication Sig  . acetaminophen (TYLENOL) 325 MG tablet Take 2 tablets (650 mg total) by mouth every 4 (four) hours as needed for headache or mild pain.  Marland Kitchen amLODipine (NORVASC) 10 MG tablet Take 1 tablet (10 mg total) by mouth daily.  Marland Kitchen apixaban (ELIQUIS) 5 MG TABS tablet Take 1 tablet (5 mg total) by mouth 2 (two) times daily.  . Brimonidine Tartrate-Timolol (COMBIGAN OP) Place 1 drop into both eyes 2 (two) times daily.   . Ca Carbonate-Mag Hydroxide (ROLAIDS PO) Take 1-2 tablets by mouth daily as needed (reflux).  . cloNIDine (CATAPRES) 0.2 MG tablet Take 1 tablet (0.2 mg total) by mouth 2 (two) times daily.  Marland Kitchen esomeprazole (NEXIUM) 40 MG capsule Take 1 capsule (40 mg total) by mouth daily.  . furosemide (LASIX) 20 MG tablet Take 1 tablet (20 mg total) by mouth daily.  Marland Kitchen glimepiride (AMARYL) 1 MG tablet Take 1 mg by mouth daily with breakfast.  . LANTUS SOLOSTAR 100 UNIT/ML Solostar Pen Inject 20 Units  into the skin every morning.   . sertraline (ZOLOFT) 100 MG tablet Take 100 mg by mouth daily.   . travoprost, benzalkonium, (TRAVATAN) 0.004 % ophthalmic solution Place 1 drop into both eyes at bedtime.  . triamcinolone cream (KENALOG) 0.1 % Apply 1 application topically 2 (two) times daily.     Allergies:   Ace inhibitors; Codeine; and Metoprolol   Social History   Socioeconomic History  . Marital status: Legally Separated    Spouse name: Not on file  . Number of children: 2  . Years of education: 39  . Highest education level: Not on file  Occupational History  . Occupation: office work  . Occupation: CNA  Social Needs  . Financial resource strain: Not on file  . Food insecurity:     Worry: Not on file    Inability: Not on file  . Transportation needs:    Medical: Not on file    Non-medical: Not on file  Tobacco Use  . Smoking status: Never Smoker  . Smokeless tobacco: Never Used  Substance and Sexual Activity  . Alcohol use: No  . Drug use: No  . Sexual activity: Never  Lifestyle  . Physical activity:    Days per week: Not on file    Minutes per session: Not on file  . Stress: Not on file  Relationships  . Social connections:    Talks on phone: Not on file    Gets together: Not on file    Attends religious service: Not on file    Active member of club or organization: Not on file    Attends meetings of clubs or organizations: Not on file    Relationship status: Not on file  Other Topics Concern  . Not on file  Social History Narrative   Patient drinks about 6 cups of caffeine daily.   Patient is right handed.     Family History: The patient's family history includes Diabetes in her cousin; Heart attack in her father; Heart disease in her brother; Leukemia in her cousin and paternal uncle; Stroke in her mother. ROS:   Please see the history of present illness.    All 14 point review of systems negative except as described per history of present illness  EKGs/Labs/Other Studies Reviewed:      Recent Labs: No results found for requested labs within last 8760 hours.  Recent Lipid Panel    Component Value Date/Time   CHOL 152 03/27/2014 0250   TRIG 78 03/27/2014 0250   HDL 76 03/27/2014 0250   CHOLHDL 2.0 03/27/2014 0250   VLDL 16 03/27/2014 0250   LDLCALC 60 03/27/2014 0250    Physical Exam:    VS:  BP (!) 160/100 (BP Location: Right Arm, Patient Position: Sitting, Cuff Size: Normal)   Pulse 78   Ht 4\' 8"  (1.422 m)   Wt 191 lb (86.6 kg)   SpO2 96%   BMI 42.82 kg/m     Wt Readings from Last 3 Encounters:  01/28/18 191 lb (86.6 kg)  09/26/16 187 lb (84.8 kg)  08/30/15 175 lb 12.8 oz (79.7 kg)     GEN:  Well nourished, well  developed in no acute distress HEENT: Normal NECK: No JVD; No carotid bruits LYMPHATICS: No lymphadenopathy CARDIAC: RRR, no murmurs, no rubs, no gallops RESPIRATORY:  Clear to auscultation without rales, wheezing or rhonchi  ABDOMEN: Soft, non-tender, non-distended MUSCULOSKELETAL:  No edema; No deformity  SKIN: Warm and dry LOWER EXTREMITIES: no swelling NEUROLOGIC:  Alert and oriented x 3 PSYCHIATRIC:  Normal affect   ASSESSMENT:    1. Paroxysmal atrial fibrillation (HCC)   2. Essential hypertension   3. Orthostatic hypotension   4. Hypercholesterolemia   5. Type 2 diabetes mellitus with hyperglycemia, with long-term current use of insulin (HCC)    PLAN:    In order of problems listed above:  1. Paroxysmal atrial fibrillation with chads 2 Vascor equals 4.  She is anticoagulated with Eliquis we will try to give her some samples if you have if not we will switch her to Coumadin as she is willing.  She is done at hole in it because her simply too much to pay for Eliquis.   2. Fatigue tiredness and shortness of breath.  Most likely multifactorial but asked her to have an echocardiogram to assess left ventricular ejection fraction. 3. Diabetes mellitus: Poorly controlled and we spent Gradle of time talking about exercises as well as good diet she promised to try to do that. 4. Dyslipidemia: She stopped her Lipitor because she read somewhere that it can cause some memory loss she stopped the medication but is not much improvement I was able to convince her to go back on the medication I offer her different medication Crestor however she said she preferred to take it when she did before since she did not have any additional side effects. 5. Essential hypertension blood pressure elevated today but this is first visit in our office when she tells me that her blood pressure at home is good.     Medication Adjustments/Labs and Tests Ordered: Current medicines are reviewed at length with  the patient today.  Concerns regarding medicines are outlined above.  No orders of the defined types were placed in this encounter.  Medication changes: No orders of the defined types were placed in this encounter.   Signed, Park Liter, MD, [facility] 01/28/2018 1:56 PM    [place]

## 2018-02-25 DIAGNOSIS — H18413 Arcus senilis, bilateral: Secondary | ICD-10-CM | POA: Diagnosis not present

## 2018-02-25 DIAGNOSIS — H401132 Primary open-angle glaucoma, bilateral, moderate stage: Secondary | ICD-10-CM | POA: Diagnosis not present

## 2018-02-25 DIAGNOSIS — Z961 Presence of intraocular lens: Secondary | ICD-10-CM | POA: Diagnosis not present

## 2018-02-25 DIAGNOSIS — H02831 Dermatochalasis of right upper eyelid: Secondary | ICD-10-CM | POA: Diagnosis not present

## 2018-03-07 ENCOUNTER — Other Ambulatory Visit: Payer: Self-pay

## 2018-03-07 ENCOUNTER — Ambulatory Visit (INDEPENDENT_AMBULATORY_CARE_PROVIDER_SITE_OTHER): Payer: Medicare HMO

## 2018-03-07 DIAGNOSIS — I480 Paroxysmal atrial fibrillation: Secondary | ICD-10-CM | POA: Diagnosis not present

## 2018-03-07 NOTE — Progress Notes (Signed)
Complete echocardiogram has been performed.  Jimmy Kenndra Morris RDCS 

## 2018-04-07 ENCOUNTER — Ambulatory Visit: Payer: Medicare HMO | Admitting: Cardiology

## 2018-04-10 DIAGNOSIS — E1165 Type 2 diabetes mellitus with hyperglycemia: Secondary | ICD-10-CM | POA: Diagnosis not present

## 2018-04-10 DIAGNOSIS — I4891 Unspecified atrial fibrillation: Secondary | ICD-10-CM | POA: Diagnosis not present

## 2018-04-10 DIAGNOSIS — I10 Essential (primary) hypertension: Secondary | ICD-10-CM | POA: Diagnosis not present

## 2018-04-10 DIAGNOSIS — E7800 Pure hypercholesterolemia, unspecified: Secondary | ICD-10-CM | POA: Diagnosis not present

## 2018-05-12 ENCOUNTER — Ambulatory Visit: Payer: Medicare HMO | Admitting: Cardiology

## 2018-07-16 ENCOUNTER — Ambulatory Visit: Payer: Medicare HMO | Admitting: Cardiology

## 2018-08-04 DIAGNOSIS — E1165 Type 2 diabetes mellitus with hyperglycemia: Secondary | ICD-10-CM | POA: Diagnosis not present

## 2018-08-26 DIAGNOSIS — H401132 Primary open-angle glaucoma, bilateral, moderate stage: Secondary | ICD-10-CM | POA: Diagnosis not present

## 2018-11-27 DIAGNOSIS — M25562 Pain in left knee: Secondary | ICD-10-CM | POA: Diagnosis not present

## 2018-11-27 DIAGNOSIS — M25561 Pain in right knee: Secondary | ICD-10-CM | POA: Diagnosis not present

## 2018-11-27 DIAGNOSIS — Z96653 Presence of artificial knee joint, bilateral: Secondary | ICD-10-CM | POA: Diagnosis not present

## 2018-11-27 DIAGNOSIS — G8929 Other chronic pain: Secondary | ICD-10-CM | POA: Diagnosis not present

## 2018-11-27 HISTORY — DX: Presence of artificial knee joint, bilateral: Z96.653

## 2018-12-29 ENCOUNTER — Ambulatory Visit: Payer: Medicare HMO | Admitting: Cardiology

## 2018-12-30 DIAGNOSIS — K045 Chronic apical periodontitis: Secondary | ICD-10-CM | POA: Diagnosis not present

## 2018-12-30 DIAGNOSIS — K046 Periapical abscess with sinus: Secondary | ICD-10-CM | POA: Diagnosis not present

## 2019-01-13 ENCOUNTER — Ambulatory Visit (INDEPENDENT_AMBULATORY_CARE_PROVIDER_SITE_OTHER): Payer: Medicare HMO | Admitting: Cardiology

## 2019-01-13 ENCOUNTER — Other Ambulatory Visit: Payer: Self-pay

## 2019-01-13 ENCOUNTER — Encounter: Payer: Self-pay | Admitting: Cardiology

## 2019-01-13 VITALS — BP 124/68 | HR 67 | Ht <= 58 in | Wt 200.0 lb

## 2019-01-13 DIAGNOSIS — I480 Paroxysmal atrial fibrillation: Secondary | ICD-10-CM | POA: Diagnosis not present

## 2019-01-13 DIAGNOSIS — E1165 Type 2 diabetes mellitus with hyperglycemia: Secondary | ICD-10-CM

## 2019-01-13 DIAGNOSIS — R12 Heartburn: Secondary | ICD-10-CM

## 2019-01-13 DIAGNOSIS — I10 Essential (primary) hypertension: Secondary | ICD-10-CM

## 2019-01-13 DIAGNOSIS — Z794 Long term (current) use of insulin: Secondary | ICD-10-CM

## 2019-01-13 HISTORY — DX: Heartburn: R12

## 2019-01-13 NOTE — Progress Notes (Signed)
Cardiology Office Note:    Date:  01/13/2019   ID:  Christina Bishop, DOB Oct 20, 1940, MRN [id number]  PCP:  Townsend Roger, MD  Cardiologist:  Jenne Campus, MD    Referring MD: Nona Dell, Corene Cornea, MD   No chief complaint on file. I have heartburn  History of Present Illness:    Christina Bishop is a 78 y.o. female with past medical history significant for paroxysmal atrial fibrillation, diabetes, hypertension.  She comes today to my office for follow-up described to have some palpitations happening maybe maybe 2-3 times a week.  Last for few minutes typically happen at evening time she when she lays down.  She stopped taking Eliquis because she could not afford it she is taking some over-the-counter herbal product that supposed to clean up her arteries.  I told her that there is absolutely no data regarding this medication and I cannot recommend this medication is substituted for Eliquis.  We talked about other options which included Coumadin.  She does not want to do that she is preferred to continue with over-the-counter product I told her that she is at risk of having stroke.  I also told her that chads 2 Vascor equals 4 would give her 4% chance of having stroke every year.  In spite of that she still prefer to continue with over-the-counter stuff.  She described to have a heartburn that happen after she eats and lay down.  She is been taking Tums she is to take omeprazole however stop it few months ago since that time she started having problems.  I told her to go over-the-counter Prevacid or Pepcid and see if that helps also asked her not to use Tums but use Maalox or Mylanta.  She complained of having shortness of breath with exertion and she said is getting worse now she got difficulty walking outside when it is hot like it is right now and she is worried that it may be related to her heart. Past Medical History:  Diagnosis Date  . Arthritis    scoriatic, rheumatoid  . Depression   .  DM2 (diabetes mellitus, type 2) (HCC)    Uncontrolled  . Glaucoma   . H/O: hysterectomy   . HTN (hypertension)   . Hypercholesterolemia   . Obesity   . Paroxysmal atrial fibrillation (HCC)   . Psoriasis   . Psoriatic arthritis ([place])   . SOB (shortness of breath) on exertion     Past Surgical History:  Procedure Laterality Date  . ABDOMINAL HYSTERECTOMY    . ABDOMINAL SURGERY    . APPENDECTOMY    . CARDIAC CATHETERIZATION    . JOINT REPLACEMENT     Both Knees  . TOTAL KNEE ARTHROPLASTY    . TUBAL LIGATION      Current Medications: Current Meds  Medication Sig  . acetaminophen (TYLENOL) 325 MG tablet Take 2 tablets (650 mg total) by mouth every 4 (four) hours as needed for headache or mild pain.  Marland Kitchen amLODipine (NORVASC) 10 MG tablet Take 1 tablet (10 mg total) by mouth daily.  . Brimonidine Tartrate-Timolol (COMBIGAN OP) Place 1 drop into both eyes 2 (two) times daily.   . Ca Carbonate-Mag Hydroxide (ROLAIDS PO) Take 1-2 tablets by mouth daily as needed (reflux).  . cloNIDine (CATAPRES) 0.2 MG tablet Take 1 tablet (0.2 mg total) by mouth 2 (two) times daily.  . furosemide (LASIX) 20 MG tablet Take 1 tablet (20 mg total) by mouth daily.  Marland Kitchen glimepiride (  AMARYL) 1 MG tablet Take 1 mg by mouth daily with breakfast.  . insulin degludec (TRESIBA FLEXTOUCH) 100 UNIT/ML SOPN FlexTouch Pen Inject 20 Units into the skin daily.  Marland Kitchen moxifloxacin (VIGAMOX) 0.5 % ophthalmic solution INT 1 GTT INTO RIGHT EYE QID  . sertraline (ZOLOFT) 100 MG tablet Take 100 mg by mouth daily.   . travoprost, benzalkonium, (TRAVATAN) 0.004 % ophthalmic solution Place 1 drop into both eyes at bedtime.     Allergies:   Ace inhibitors, Codeine, and Metoprolol   Social History   Socioeconomic History  . Marital status: Legally Separated    Spouse name: Not on file  . Number of children: 2  . Years of education: 40  . Highest education level: Not on file  Occupational History  . Occupation: office work  .  Occupation: CNA  Social Needs  . Financial resource strain: Not on file  . Food insecurity    Worry: Not on file    Inability: Not on file  . Transportation needs    Medical: Not on file    Non-medical: Not on file  Tobacco Use  . Smoking status: Never Smoker  . Smokeless tobacco: Never Used  Substance and Sexual Activity  . Alcohol use: No  . Drug use: No  . Sexual activity: Never  Lifestyle  . Physical activity    Days per week: Not on file    Minutes per session: Not on file  . Stress: Not on file  Relationships  . Social Herbalist on phone: Not on file    Gets together: Not on file    Attends religious service: Not on file    Active member of club or organization: Not on file    Attends meetings of clubs or organizations: Not on file    Relationship status: Not on file  Other Topics Concern  . Not on file  Social History Narrative   Patient drinks about 6 cups of caffeine daily.   Patient is right handed.     Family History: The patient's family history includes Diabetes in her cousin; Heart attack in her father; Heart disease in her brother; Leukemia in her cousin and paternal uncle; Stroke in her mother. ROS:   Please see the history of present illness.    All 14 point review of systems negative except as described per history of present illness  EKGs/Labs/Other Studies Reviewed:      Recent Labs: No results found for requested labs within last 8760 hours.  Recent Lipid Panel    Component Value Date/Time   CHOL 152 03/27/2014 0250   TRIG 78 03/27/2014 0250   HDL 76 03/27/2014 0250   CHOLHDL 2.0 03/27/2014 0250   VLDL 16 03/27/2014 0250   LDLCALC 60 03/27/2014 0250    Physical Exam:    VS:  BP 124/68   Pulse 67   Ht 4\' 9"  (1.448 m)   Wt 200 lb (90.7 kg)   SpO2 98%   BMI 43.28 kg/m     Wt Readings from Last 3 Encounters:  01/13/19 200 lb (90.7 kg)  01/28/18 191 lb (86.6 kg)  09/26/16 187 lb (84.8 kg)     GEN:  Well nourished,  well developed in no acute distress HEENT: Normal NECK: No JVD; No carotid bruits LYMPHATICS: No lymphadenopathy CARDIAC: RRR, no murmurs, no rubs, no gallops RESPIRATORY:  Clear to auscultation without rales, wheezing or rhonchi  ABDOMEN: Soft, non-tender, non-distended MUSCULOSKELETAL:  No edema; No  deformity  SKIN: Warm and dry LOWER EXTREMITIES: no swelling NEUROLOGIC:  Alert and oriented x 3 PSYCHIATRIC:  Normal affect   ASSESSMENT:    1. Paroxysmal atrial fibrillation (HCC)   2. Type 2 diabetes mellitus with hyperglycemia, with long-term current use of insulin (HCC)   3. Essential hypertension   4. Heartburn    PLAN:    In order of problems listed above:  1. Paroxysmal atrial fibrillation.  Refusing anticoagulation in spite of chads 2 vascular being 4.  Clearly there is some financial issues there.  I have horses some samples but she prefers to take over-the-counter medication 2. Type 2 diabetes apparently stable followed by internal medicine team 3. Essential hypertension well-controlled with blood pressure 124/60 today we will continue present management 4. Heartburn asked her to start taking Maalox or Mylanta as well as Pepcid to Prevacid.  Asked her not to take Tums.  Will do echocardiogram to assess left ventricular ejection fraction   Medication Adjustments/Labs and Tests Ordered: Current medicines are reviewed at length with the patient today.  Concerns regarding medicines are outlined above.  No orders of the defined types were placed in this encounter.  Medication changes: No orders of the defined types were placed in this encounter.   Signed, Park Liter, MD, [facility] 01/13/2019 1:36 PM    [place]

## 2019-01-13 NOTE — Patient Instructions (Signed)
Medication Instructions:  Your physician recommends that you continue on your current medications as directed. Please refer to the Current Medication list given to you today.  If you need a refill on your cardiac medications before your next appointment, please call your pharmacy.   Lab work: None.  If you have labs (blood work) drawn today and your tests are completely normal, you will receive your results only by: Marland Kitchen MyChart Message (if you have MyChart) OR . A paper copy in the mail If you have any lab test that is abnormal or we need to change your treatment, we will call you to review the results.  Testing/Procedures: Your physician has requested that you have an echocardiogram. Echocardiography is a painless test that uses sound waves to create images of your heart. It provides your doctor with information about the size and shape of your heart and how well your heart's chambers and valves are working. This procedure takes approximately one hour. There are no restrictions for this procedure.   Your physician has recommended that you wear a holter monitor. Holter monitors are medical devices that record the heart's electrical activity. Doctors most often use these monitors to diagnose arrhythmias. Arrhythmias are problems with the speed or rhythm of the heartbeat. The monitor is a small, portable device. You can wear one while you do your normal daily activities. This is usually used to diagnose what is causing palpitations/syncope (passing out). Wear for 7 days.     Follow-Up: At [facility], you and your health needs are our priority.  As part of our continuing mission to provide you with exceptional heart care, we have created designated Provider Care Teams.  These Care Teams include your primary Cardiologist (physician) and Advanced Practice Providers (APPs -  Physician Assistants and Nurse Practitioners) who all work together to provide you with the care you need, when you need it.  You will need a follow up appointment in 2 months.  Please call our office 2 months in advance to schedule this appointment.  You may see No primary care provider on file. or another member of our [employer] Provider Team in [place]: Shirlee More, MD . Jyl Heinz, MD  Any Other Special Instructions Will Be Listed Below (If Applicable).   Echocardiogram An echocardiogram is a procedure that uses painless sound waves (ultrasound) to produce an image of the heart. Images from an echocardiogram can provide important information about:  Signs of coronary artery disease (CAD).  Aneurysm detection. An aneurysm is a weak or damaged part of an artery wall that bulges out from the normal force of blood pumping through the body.  Heart size and shape. Changes in the size or shape of the heart can be associated with certain conditions, including heart failure, aneurysm, and CAD.  Heart muscle function.  Heart valve function.  Signs of a past heart attack.  Fluid buildup around the heart.  Thickening of the heart muscle.  A tumor or infectious growth around the heart valves. Tell a health care provider about:  Any allergies you have.  All medicines you are taking, including vitamins, herbs, eye drops, creams, and over-the-counter medicines.  Any blood disorders you have.  Any surgeries you have had.  Any medical conditions you have.  Whether you are pregnant or may be pregnant. What are the risks? Generally, this is a safe procedure. However, problems may occur, including:  Allergic reaction to dye (contrast) that may be used during the procedure. What happens before the  procedure? No specific preparation is needed. You may eat and drink normally. What happens during the procedure?   An IV tube may be inserted into one of your veins.  You may receive contrast through this tube. A contrast is an injection that improves the quality of the pictures from your heart.  A  gel will be applied to your chest.  A wand-like tool (transducer) will be moved over your chest. The gel will help to transmit the sound waves from the transducer.  The sound waves will harmlessly bounce off of your heart to allow the heart images to be captured in real-time motion. The images will be recorded on a computer. The procedure may vary among health care providers and hospitals. What happens after the procedure?  You may return to your normal, everyday life, including diet, activities, and medicines, unless your health care provider tells you not to do that. Summary  An echocardiogram is a procedure that uses painless sound waves (ultrasound) to produce an image of the heart.  Images from an echocardiogram can provide important information about the size and shape of your heart, heart muscle function, heart valve function, and fluid buildup around your heart.  You do not need to do anything to prepare before this procedure. You may eat and drink normally.  After the echocardiogram is completed, you may return to your normal, everyday life, unless your health care provider tells you not to do that. This information is not intended to replace advice given to you by your health care provider. Make sure you discuss any questions you have with your health care provider. Document Released: 06/08/2000 Document Revised: 10/02/2018 Document Reviewed: 07/14/2016 Elsevier Patient Education  2020 [employer].

## 2019-01-13 NOTE — Addendum Note (Signed)
Addended by: Ashok Norris on: 01/13/2019 01:44 PM   Modules accepted: Orders

## 2019-01-22 ENCOUNTER — Other Ambulatory Visit (INDEPENDENT_AMBULATORY_CARE_PROVIDER_SITE_OTHER): Payer: Medicare HMO

## 2019-01-22 DIAGNOSIS — I10 Essential (primary) hypertension: Secondary | ICD-10-CM

## 2019-01-22 DIAGNOSIS — I480 Paroxysmal atrial fibrillation: Secondary | ICD-10-CM | POA: Diagnosis not present

## 2019-02-06 DIAGNOSIS — I480 Paroxysmal atrial fibrillation: Secondary | ICD-10-CM | POA: Diagnosis not present

## 2019-02-19 ENCOUNTER — Telehealth: Payer: Self-pay | Admitting: *Deleted

## 2019-02-19 NOTE — Telephone Encounter (Signed)
Telephone call to patient. Left message  Per DPR of monitor results and to call with any questions.

## 2019-02-19 NOTE — Telephone Encounter (Signed)
-----   Message from Park Liter, MD sent at 02/18/2019  7:23 PM EDT ----- PAF - known

## 2019-02-27 ENCOUNTER — Ambulatory Visit (INDEPENDENT_AMBULATORY_CARE_PROVIDER_SITE_OTHER): Payer: Medicare HMO

## 2019-02-27 ENCOUNTER — Other Ambulatory Visit: Payer: Self-pay

## 2019-02-27 DIAGNOSIS — I10 Essential (primary) hypertension: Secondary | ICD-10-CM | POA: Diagnosis not present

## 2019-02-27 DIAGNOSIS — I480 Paroxysmal atrial fibrillation: Secondary | ICD-10-CM | POA: Diagnosis not present

## 2019-02-27 NOTE — Progress Notes (Signed)
Complete echocardiogram has been performed.  Jimmy Fantasy Donald RDCS, RVT 

## 2019-03-17 ENCOUNTER — Ambulatory Visit: Payer: Medicare HMO | Admitting: Cardiology

## 2019-04-20 DIAGNOSIS — E1165 Type 2 diabetes mellitus with hyperglycemia: Secondary | ICD-10-CM | POA: Diagnosis not present

## 2019-04-20 DIAGNOSIS — Z23 Encounter for immunization: Secondary | ICD-10-CM | POA: Diagnosis not present

## 2019-05-11 ENCOUNTER — Other Ambulatory Visit: Payer: Self-pay

## 2019-05-11 ENCOUNTER — Encounter: Payer: Self-pay | Admitting: Cardiology

## 2019-05-11 ENCOUNTER — Ambulatory Visit (INDEPENDENT_AMBULATORY_CARE_PROVIDER_SITE_OTHER): Payer: Medicare HMO | Admitting: Cardiology

## 2019-05-11 VITALS — BP 118/76 | HR 76 | Ht <= 58 in | Wt 200.6 lb

## 2019-05-11 DIAGNOSIS — I10 Essential (primary) hypertension: Secondary | ICD-10-CM | POA: Diagnosis not present

## 2019-05-11 DIAGNOSIS — E7800 Pure hypercholesterolemia, unspecified: Secondary | ICD-10-CM

## 2019-05-11 DIAGNOSIS — E1165 Type 2 diabetes mellitus with hyperglycemia: Secondary | ICD-10-CM | POA: Diagnosis not present

## 2019-05-11 DIAGNOSIS — R0602 Shortness of breath: Secondary | ICD-10-CM

## 2019-05-11 DIAGNOSIS — Z794 Long term (current) use of insulin: Secondary | ICD-10-CM

## 2019-05-11 DIAGNOSIS — Z6841 Body Mass Index (BMI) 40.0 and over, adult: Secondary | ICD-10-CM | POA: Diagnosis not present

## 2019-05-11 DIAGNOSIS — I480 Paroxysmal atrial fibrillation: Secondary | ICD-10-CM | POA: Diagnosis not present

## 2019-05-11 NOTE — Patient Instructions (Signed)
Medication Instructions:  Your physician recommends that you continue on your current medications as directed. Please refer to the Current Medication list given to you today.  *If you need a refill on your cardiac medications before your next appointment, please call your pharmacy*  Lab Work: None.   If you have labs (blood work) drawn today and your tests are completely normal, you will receive your results only by: Marland Kitchen MyChart Message (if you have MyChart) OR . A paper copy in the mail If you have any lab test that is abnormal or we need to change your treatment, we will call you to review the results.  Testing/Procedures: Your physician has requested that you have a lexiscan myoview. For further information please visit [email or web address]. Please follow instruction sheet, as given.    Follow-Up: At [facility], you and your health needs are our priority.  As part of our continuing mission to provide you with exceptional heart care, we have created designated Provider Care Teams.  These Care Teams include your primary Cardiologist (physician) and Advanced Practice Providers (APPs -  Physician Assistants and Nurse Practitioners) who all work together to provide you with the care you need, when you need it.  Your next appointment:   3 months  The format for your next appointment:   In Person  Provider:   Jenne Campus, MD  Other Instructions   Cardiac Nuclear Scan A cardiac nuclear scan is a test that measures blood flow to the heart when a person is resting and when he or she is exercising. The test looks for problems such as:  Not enough blood reaching a portion of the heart.  The heart muscle not working normally. You may need this test if:  You have heart disease.  You have had abnormal lab results.  You have had heart surgery or a balloon procedure to open up blocked arteries (angioplasty).  You have chest pain.  You have shortness of breath. In this test,  a radioactive dye (tracer) is injected into your bloodstream. After the tracer has traveled to your heart, an imaging device is used to measure how much of the tracer is absorbed by or distributed to various areas of your heart. This procedure is usually done at a hospital and takes 2-4 hours. Tell a health care provider about:  Any allergies you have.  All medicines you are taking, including vitamins, herbs, eye drops, creams, and over-the-counter medicines.  Any problems you or family members have had with anesthetic medicines.  Any blood disorders you have.  Any surgeries you have had.  Any medical conditions you have.  Whether you are pregnant or may be pregnant. What are the risks? Generally, this is a safe procedure. However, problems may occur, including:  Serious chest pain and heart attack. This is only a risk if the stress portion of the test is done.  Rapid heartbeat.  Sensation of warmth in your chest. This usually passes quickly.  Allergic reaction to the tracer. What happens before the procedure?  Ask your health care provider about changing or stopping your regular medicines. This is especially important if you are taking diabetes medicines or blood thinners.  Follow instructions from your health care provider about eating or drinking restrictions.  Remove your jewelry on the day of the procedure. What happens during the procedure?  An IV will be inserted into one of your veins.  Your health care provider will inject a small amount of radioactive tracer through the IV.  You will wait for 20-40 minutes while the tracer travels through your bloodstream.  Your heart activity will be monitored with an electrocardiogram (ECG).  You will lie down on an exam table.  Images of your heart will be taken for about 15-20 minutes.  You may also have a stress test. For this test, one of the following may be done: ? You will exercise on a treadmill or stationary bike.  While you exercise, your heart's activity will be monitored with an ECG, and your blood pressure will be checked. ? You will be given medicines that will increase blood flow to parts of your heart. This is done if you are unable to exercise.  When blood flow to your heart has peaked, a tracer will again be injected through the IV.  After 20-40 minutes, you will get back on the exam table and have more images taken of your heart.  Depending on the type of tracer used, scans may need to be repeated 3-4 hours later.  Your IV line will be removed when the procedure is over. The procedure may vary among health care providers and hospitals. What happens after the procedure?  Unless your health care provider tells you otherwise, you may return to your normal schedule, including diet, activities, and medicines.  Unless your health care provider tells you otherwise, you may increase your fluid intake. This will help to flush the contrast dye from your body. Drink enough fluid to keep your urine pale yellow.  Ask your health care provider, or the department that is doing the test: ? When will my results be ready? ? How will I get my results? Summary  A cardiac nuclear scan measures the blood flow to the heart when a person is resting and when he or she is exercising.  Tell your health care provider if you are pregnant.  Before the procedure, ask your health care provider about changing or stopping your regular medicines. This is especially important if you are taking diabetes medicines or blood thinners.  After the procedure, unless your health care provider tells you otherwise, increase your fluid intake. This will help flush the contrast dye from your body.  After the procedure, unless your health care provider tells you otherwise, you may return to your normal schedule, including diet, activities, and medicines. This information is not intended to replace advice given to you by your health  care provider. Make sure you discuss any questions you have with your health care provider. Document Released: 07/06/2004 Document Revised: 11/25/2017 Document Reviewed: 11/25/2017 Elsevier Patient Education  2020 [employer].

## 2019-05-11 NOTE — Progress Notes (Signed)
Cardiology Office Note:    Date:  05/11/2019   ID:  Christina Bishop, DOB 1941-05-27, MRN [id number]  PCP:  Townsend Roger, MD  Cardiologist:  Jenne Campus, MD    Referring MD: Nona Dell, Corene Cornea, MD   Chief Complaint  Patient presents with  . Follow-up  Still having shortness of breath  History of Present Illness:    Christina Bishop is a 78 y.o. female with paroxysmal atrial fibrillation, diabetes, essential hypertension comes today to our office to continue discussion about anticoagulation.  Her chads 2 Vascor equals 4 she does not want to take any anticoagulation she takes some herbal over-the-counter medication she convinced it works and have she said when she take her sugar her blood will drip from her finger a lot.  I told her there is no data about this and she ideally need to be anticoagulated still does not want it.  Another complaint she has is progressive shortness of breath she said she get this for years but lately became really bad there is no swelling of lower extremities no proximal nocturnal dyspnea.  Her echocardiogram showed preserved ejection fraction with some diastolic dysfunction.  Of course the question about potentially having coronary artery disease has been raised.  Past Medical History:  Diagnosis Date  . Arthritis    scoriatic, rheumatoid  . Depression   . DM2 (diabetes mellitus, type 2) (HCC)    Uncontrolled  . Glaucoma   . H/O: hysterectomy   . HTN (hypertension)   . Hypercholesterolemia   . Obesity   . Paroxysmal atrial fibrillation (HCC)   . Psoriasis   . Psoriatic arthritis ([place])   . SOB (shortness of breath) on exertion     Past Surgical History:  Procedure Laterality Date  . ABDOMINAL HYSTERECTOMY    . ABDOMINAL SURGERY    . APPENDECTOMY    . CARDIAC CATHETERIZATION    . JOINT REPLACEMENT     Both Knees  . TOTAL KNEE ARTHROPLASTY    . TUBAL LIGATION      Current Medications: Current Meds  Medication Sig  . acetaminophen  (TYLENOL) 325 MG tablet Take 2 tablets (650 mg total) by mouth every 4 (four) hours as needed for headache or mild pain.  Marland Kitchen amLODipine (NORVASC) 10 MG tablet Take 1 tablet (10 mg total) by mouth daily.  . Brimonidine Tartrate-Timolol (COMBIGAN OP) Place 1 drop into both eyes 2 (two) times daily.   . Ca Carbonate-Mag Hydroxide (ROLAIDS PO) Take 1-2 tablets by mouth daily as needed (reflux).  . cloNIDine (CATAPRES) 0.2 MG tablet Take 1 tablet (0.2 mg total) by mouth 2 (two) times daily.  . furosemide (LASIX) 20 MG tablet Take 1 tablet (20 mg total) by mouth daily.  Marland Kitchen glimepiride (AMARYL) 1 MG tablet Take 1 mg by mouth daily with breakfast.  . insulin degludec (TRESIBA FLEXTOUCH) 100 UNIT/ML SOPN FlexTouch Pen Inject 24 Units into the skin daily.   Marland Kitchen moxifloxacin (VIGAMOX) 0.5 % ophthalmic solution INT 1 GTT INTO RIGHT EYE QID  . sertraline (ZOLOFT) 100 MG tablet Take 100 mg by mouth daily.   . travoprost, benzalkonium, (TRAVATAN) 0.004 % ophthalmic solution Place 1 drop into both eyes at bedtime.     Allergies:   Ace inhibitors, Codeine, and Metoprolol   Social History   Socioeconomic History  . Marital status: Legally Separated    Spouse name: Not on file  . Number of children: 2  . Years of education: 41  . Highest  education level: Not on file  Occupational History  . Occupation: office work  . Occupation: CNA  Social Needs  . Financial resource strain: Not on file  . Food insecurity    Worry: Not on file    Inability: Not on file  . Transportation needs    Medical: Not on file    Non-medical: Not on file  Tobacco Use  . Smoking status: Never Smoker  . Smokeless tobacco: Never Used  Substance and Sexual Activity  . Alcohol use: No  . Drug use: No  . Sexual activity: Never  Lifestyle  . Physical activity    Days per week: Not on file    Minutes per session: Not on file  . Stress: Not on file  Relationships  . Social Herbalist on phone: Not on file    Gets  together: Not on file    Attends religious service: Not on file    Active member of club or organization: Not on file    Attends meetings of clubs or organizations: Not on file    Relationship status: Not on file  Other Topics Concern  . Not on file  Social History Narrative   Patient drinks about 6 cups of caffeine daily.   Patient is right handed.     Family History: The patient's family history includes Diabetes in her cousin; Heart attack in her father; Heart disease in her brother; Leukemia in her cousin and paternal uncle; Stroke in her mother. ROS:   Please see the history of present illness.    All 14 point review of systems negative except as described per history of present illness  EKGs/Labs/Other Studies Reviewed:      Recent Labs: No results found for requested labs within last 8760 hours.  Recent Lipid Panel    Component Value Date/Time   CHOL 152 03/27/2014 0250   TRIG 78 03/27/2014 0250   HDL 76 03/27/2014 0250   CHOLHDL 2.0 03/27/2014 0250   VLDL 16 03/27/2014 0250   LDLCALC 60 03/27/2014 0250    Physical Exam:    VS:  BP 118/76   Pulse 76   Ht 4\' 9"  (1.448 m)   Wt 200 lb 9.6 oz (91 kg)   SpO2 98%   BMI 43.41 kg/m     Wt Readings from Last 3 Encounters:  05/11/19 200 lb 9.6 oz (91 kg)  01/13/19 200 lb (90.7 kg)  01/28/18 191 lb (86.6 kg)     GEN:  Well nourished, well developed in no acute distress HEENT: Normal NECK: No JVD; No carotid bruits LYMPHATICS: No lymphadenopathy CARDIAC: RRR, no murmurs, no rubs, no gallops RESPIRATORY:  Clear to auscultation without rales, wheezing or rhonchi  ABDOMEN: Soft, non-tender, non-distended MUSCULOSKELETAL:  No edema; No deformity  SKIN: Warm and dry LOWER EXTREMITIES: no swelling NEUROLOGIC:  Alert and oriented x 3 PSYCHIATRIC:  Normal affect   ASSESSMENT:    1. Paroxysmal atrial fibrillation (HCC)   2. Essential hypertension   3. SOB (shortness of breath) on exertion   4.  Hypercholesterolemia   5. Type 2 diabetes mellitus with hyperglycemia, with long-term current use of insulin (HCC)    PLAN:    In order of problems listed above:  1. Paroxysmal atrial fibrillation denies having a palpitations, refused to take anticoagulation. 2. Essential hypertension blood pressure well controlled continue present management. 3. Shortness of breath multifactorial obesity and some lack of activity probably plays some role here however after rule  out potentially having coronary disease.  Stress test will be scheduled. 4. Dyslipidemia she is not taking any medication for it.  I see her last cholesterol profile from last year.  We will call primary care physician to get your cholesterol profile. 5. Type 2 diabetes.  Apparently stable.   Medication Adjustments/Labs and Tests Ordered: Current medicines are reviewed at length with the patient today.  Concerns regarding medicines are outlined above.  No orders of the defined types were placed in this encounter.  Medication changes: No orders of the defined types were placed in this encounter.   Signed, Park Liter, MD, [facility] 05/11/2019 3:06 PM    [place]

## 2019-05-11 NOTE — Addendum Note (Signed)
Addended by: Ashok Norris on: 05/11/2019 03:14 PM   Modules accepted: Orders

## 2019-06-01 DIAGNOSIS — H18413 Arcus senilis, bilateral: Secondary | ICD-10-CM | POA: Diagnosis not present

## 2019-06-01 DIAGNOSIS — Z961 Presence of intraocular lens: Secondary | ICD-10-CM | POA: Diagnosis not present

## 2019-06-01 DIAGNOSIS — H02831 Dermatochalasis of right upper eyelid: Secondary | ICD-10-CM | POA: Diagnosis not present

## 2019-06-01 DIAGNOSIS — H401132 Primary open-angle glaucoma, bilateral, moderate stage: Secondary | ICD-10-CM | POA: Diagnosis not present

## 2019-06-03 DIAGNOSIS — E7800 Pure hypercholesterolemia, unspecified: Secondary | ICD-10-CM | POA: Diagnosis not present

## 2019-06-03 DIAGNOSIS — Z6841 Body Mass Index (BMI) 40.0 and over, adult: Secondary | ICD-10-CM | POA: Diagnosis not present

## 2019-06-03 DIAGNOSIS — Z0000 Encounter for general adult medical examination without abnormal findings: Secondary | ICD-10-CM | POA: Diagnosis not present

## 2019-06-03 DIAGNOSIS — I10 Essential (primary) hypertension: Secondary | ICD-10-CM | POA: Diagnosis not present

## 2019-06-03 DIAGNOSIS — I4891 Unspecified atrial fibrillation: Secondary | ICD-10-CM | POA: Diagnosis not present

## 2019-06-03 DIAGNOSIS — M8949 Other hypertrophic osteoarthropathy, multiple sites: Secondary | ICD-10-CM | POA: Diagnosis not present

## 2019-06-03 DIAGNOSIS — E1165 Type 2 diabetes mellitus with hyperglycemia: Secondary | ICD-10-CM | POA: Diagnosis not present

## 2019-06-03 DIAGNOSIS — R2681 Unsteadiness on feet: Secondary | ICD-10-CM | POA: Diagnosis not present

## 2019-06-03 DIAGNOSIS — Z1389 Encounter for screening for other disorder: Secondary | ICD-10-CM | POA: Diagnosis not present

## 2019-06-08 DIAGNOSIS — M25571 Pain in right ankle and joints of right foot: Secondary | ICD-10-CM | POA: Diagnosis not present

## 2019-06-08 DIAGNOSIS — R262 Difficulty in walking, not elsewhere classified: Secondary | ICD-10-CM | POA: Diagnosis not present

## 2019-06-08 DIAGNOSIS — M25669 Stiffness of unspecified knee, not elsewhere classified: Secondary | ICD-10-CM | POA: Diagnosis not present

## 2019-06-08 DIAGNOSIS — R531 Weakness: Secondary | ICD-10-CM | POA: Diagnosis not present

## 2019-06-08 DIAGNOSIS — M5431 Sciatica, right side: Secondary | ICD-10-CM | POA: Diagnosis not present

## 2019-06-08 DIAGNOSIS — R296 Repeated falls: Secondary | ICD-10-CM | POA: Diagnosis not present

## 2019-06-11 DIAGNOSIS — M25571 Pain in right ankle and joints of right foot: Secondary | ICD-10-CM | POA: Diagnosis not present

## 2019-06-11 DIAGNOSIS — R531 Weakness: Secondary | ICD-10-CM | POA: Diagnosis not present

## 2019-06-11 DIAGNOSIS — M5431 Sciatica, right side: Secondary | ICD-10-CM | POA: Diagnosis not present

## 2019-06-11 DIAGNOSIS — R262 Difficulty in walking, not elsewhere classified: Secondary | ICD-10-CM | POA: Diagnosis not present

## 2019-06-11 DIAGNOSIS — R296 Repeated falls: Secondary | ICD-10-CM | POA: Diagnosis not present

## 2019-06-11 DIAGNOSIS — M25669 Stiffness of unspecified knee, not elsewhere classified: Secondary | ICD-10-CM | POA: Diagnosis not present

## 2019-06-16 DIAGNOSIS — R296 Repeated falls: Secondary | ICD-10-CM | POA: Diagnosis not present

## 2019-06-16 DIAGNOSIS — R262 Difficulty in walking, not elsewhere classified: Secondary | ICD-10-CM | POA: Diagnosis not present

## 2019-06-16 DIAGNOSIS — M25571 Pain in right ankle and joints of right foot: Secondary | ICD-10-CM | POA: Diagnosis not present

## 2019-06-16 DIAGNOSIS — R531 Weakness: Secondary | ICD-10-CM | POA: Diagnosis not present

## 2019-06-16 DIAGNOSIS — M25669 Stiffness of unspecified knee, not elsewhere classified: Secondary | ICD-10-CM | POA: Diagnosis not present

## 2019-06-16 DIAGNOSIS — M5431 Sciatica, right side: Secondary | ICD-10-CM | POA: Diagnosis not present

## 2019-06-17 DIAGNOSIS — R296 Repeated falls: Secondary | ICD-10-CM | POA: Diagnosis not present

## 2019-06-17 DIAGNOSIS — R531 Weakness: Secondary | ICD-10-CM | POA: Diagnosis not present

## 2019-06-17 DIAGNOSIS — M25669 Stiffness of unspecified knee, not elsewhere classified: Secondary | ICD-10-CM | POA: Diagnosis not present

## 2019-06-17 DIAGNOSIS — M5431 Sciatica, right side: Secondary | ICD-10-CM | POA: Diagnosis not present

## 2019-06-17 DIAGNOSIS — R262 Difficulty in walking, not elsewhere classified: Secondary | ICD-10-CM | POA: Diagnosis not present

## 2019-06-17 DIAGNOSIS — M25571 Pain in right ankle and joints of right foot: Secondary | ICD-10-CM | POA: Diagnosis not present

## 2019-06-23 DIAGNOSIS — R531 Weakness: Secondary | ICD-10-CM | POA: Diagnosis not present

## 2019-06-23 DIAGNOSIS — R296 Repeated falls: Secondary | ICD-10-CM | POA: Diagnosis not present

## 2019-06-23 DIAGNOSIS — M5431 Sciatica, right side: Secondary | ICD-10-CM | POA: Diagnosis not present

## 2019-06-23 DIAGNOSIS — M25571 Pain in right ankle and joints of right foot: Secondary | ICD-10-CM | POA: Diagnosis not present

## 2019-06-23 DIAGNOSIS — M25669 Stiffness of unspecified knee, not elsewhere classified: Secondary | ICD-10-CM | POA: Diagnosis not present

## 2019-06-23 DIAGNOSIS — R262 Difficulty in walking, not elsewhere classified: Secondary | ICD-10-CM | POA: Diagnosis not present

## 2019-06-25 DIAGNOSIS — M25571 Pain in right ankle and joints of right foot: Secondary | ICD-10-CM | POA: Diagnosis not present

## 2019-06-25 DIAGNOSIS — R531 Weakness: Secondary | ICD-10-CM | POA: Diagnosis not present

## 2019-06-25 DIAGNOSIS — M5431 Sciatica, right side: Secondary | ICD-10-CM | POA: Diagnosis not present

## 2019-06-25 DIAGNOSIS — M25669 Stiffness of unspecified knee, not elsewhere classified: Secondary | ICD-10-CM | POA: Diagnosis not present

## 2019-06-25 DIAGNOSIS — R296 Repeated falls: Secondary | ICD-10-CM | POA: Diagnosis not present

## 2019-06-25 DIAGNOSIS — R262 Difficulty in walking, not elsewhere classified: Secondary | ICD-10-CM | POA: Diagnosis not present

## 2019-06-30 DIAGNOSIS — M25669 Stiffness of unspecified knee, not elsewhere classified: Secondary | ICD-10-CM | POA: Diagnosis not present

## 2019-06-30 DIAGNOSIS — R531 Weakness: Secondary | ICD-10-CM | POA: Diagnosis not present

## 2019-06-30 DIAGNOSIS — R262 Difficulty in walking, not elsewhere classified: Secondary | ICD-10-CM | POA: Diagnosis not present

## 2019-06-30 DIAGNOSIS — M25571 Pain in right ankle and joints of right foot: Secondary | ICD-10-CM | POA: Diagnosis not present

## 2019-06-30 DIAGNOSIS — M5431 Sciatica, right side: Secondary | ICD-10-CM | POA: Diagnosis not present

## 2019-06-30 DIAGNOSIS — R296 Repeated falls: Secondary | ICD-10-CM | POA: Diagnosis not present

## 2019-07-02 DIAGNOSIS — R262 Difficulty in walking, not elsewhere classified: Secondary | ICD-10-CM | POA: Diagnosis not present

## 2019-07-02 DIAGNOSIS — M25669 Stiffness of unspecified knee, not elsewhere classified: Secondary | ICD-10-CM | POA: Diagnosis not present

## 2019-07-02 DIAGNOSIS — M5431 Sciatica, right side: Secondary | ICD-10-CM | POA: Diagnosis not present

## 2019-07-02 DIAGNOSIS — R296 Repeated falls: Secondary | ICD-10-CM | POA: Diagnosis not present

## 2019-07-02 DIAGNOSIS — M25571 Pain in right ankle and joints of right foot: Secondary | ICD-10-CM | POA: Diagnosis not present

## 2019-07-02 DIAGNOSIS — R531 Weakness: Secondary | ICD-10-CM | POA: Diagnosis not present

## 2019-07-07 DIAGNOSIS — M25669 Stiffness of unspecified knee, not elsewhere classified: Secondary | ICD-10-CM | POA: Diagnosis not present

## 2019-07-07 DIAGNOSIS — M5431 Sciatica, right side: Secondary | ICD-10-CM | POA: Diagnosis not present

## 2019-07-07 DIAGNOSIS — Z1211 Encounter for screening for malignant neoplasm of colon: Secondary | ICD-10-CM | POA: Diagnosis not present

## 2019-07-07 DIAGNOSIS — R262 Difficulty in walking, not elsewhere classified: Secondary | ICD-10-CM | POA: Diagnosis not present

## 2019-07-07 DIAGNOSIS — R296 Repeated falls: Secondary | ICD-10-CM | POA: Diagnosis not present

## 2019-07-07 DIAGNOSIS — Z1212 Encounter for screening for malignant neoplasm of rectum: Secondary | ICD-10-CM | POA: Diagnosis not present

## 2019-07-07 DIAGNOSIS — M25571 Pain in right ankle and joints of right foot: Secondary | ICD-10-CM | POA: Diagnosis not present

## 2019-07-07 DIAGNOSIS — R531 Weakness: Secondary | ICD-10-CM | POA: Diagnosis not present

## 2019-07-09 DIAGNOSIS — M5431 Sciatica, right side: Secondary | ICD-10-CM | POA: Diagnosis not present

## 2019-07-09 DIAGNOSIS — M25669 Stiffness of unspecified knee, not elsewhere classified: Secondary | ICD-10-CM | POA: Diagnosis not present

## 2019-07-09 DIAGNOSIS — R262 Difficulty in walking, not elsewhere classified: Secondary | ICD-10-CM | POA: Diagnosis not present

## 2019-07-09 DIAGNOSIS — R296 Repeated falls: Secondary | ICD-10-CM | POA: Diagnosis not present

## 2019-07-09 DIAGNOSIS — R531 Weakness: Secondary | ICD-10-CM | POA: Diagnosis not present

## 2019-07-09 DIAGNOSIS — M25571 Pain in right ankle and joints of right foot: Secondary | ICD-10-CM | POA: Diagnosis not present

## 2019-07-14 DIAGNOSIS — M5431 Sciatica, right side: Secondary | ICD-10-CM | POA: Diagnosis not present

## 2019-07-14 DIAGNOSIS — R531 Weakness: Secondary | ICD-10-CM | POA: Diagnosis not present

## 2019-07-14 DIAGNOSIS — M25669 Stiffness of unspecified knee, not elsewhere classified: Secondary | ICD-10-CM | POA: Diagnosis not present

## 2019-07-14 DIAGNOSIS — R262 Difficulty in walking, not elsewhere classified: Secondary | ICD-10-CM | POA: Diagnosis not present

## 2019-07-14 DIAGNOSIS — M25571 Pain in right ankle and joints of right foot: Secondary | ICD-10-CM | POA: Diagnosis not present

## 2019-07-14 DIAGNOSIS — R296 Repeated falls: Secondary | ICD-10-CM | POA: Diagnosis not present

## 2019-07-20 DIAGNOSIS — I10 Essential (primary) hypertension: Secondary | ICD-10-CM | POA: Diagnosis not present

## 2019-07-20 DIAGNOSIS — H401132 Primary open-angle glaucoma, bilateral, moderate stage: Secondary | ICD-10-CM | POA: Diagnosis not present

## 2019-07-20 DIAGNOSIS — Z961 Presence of intraocular lens: Secondary | ICD-10-CM | POA: Diagnosis not present

## 2019-10-22 DIAGNOSIS — Z1231 Encounter for screening mammogram for malignant neoplasm of breast: Secondary | ICD-10-CM | POA: Diagnosis not present

## 2020-02-18 ENCOUNTER — Encounter: Payer: Self-pay | Admitting: Legal Medicine

## 2020-02-18 ENCOUNTER — Other Ambulatory Visit: Payer: Self-pay

## 2020-02-18 ENCOUNTER — Ambulatory Visit (INDEPENDENT_AMBULATORY_CARE_PROVIDER_SITE_OTHER): Payer: Medicare HMO | Admitting: Legal Medicine

## 2020-02-18 VITALS — BP 120/70 | HR 99 | Temp 97.0°F | Ht <= 58 in | Wt 199.6 lb

## 2020-02-18 DIAGNOSIS — J440 Chronic obstructive pulmonary disease with acute lower respiratory infection: Secondary | ICD-10-CM | POA: Diagnosis not present

## 2020-02-18 DIAGNOSIS — E1159 Type 2 diabetes mellitus with other circulatory complications: Secondary | ICD-10-CM

## 2020-02-18 DIAGNOSIS — E669 Obesity, unspecified: Secondary | ICD-10-CM | POA: Diagnosis not present

## 2020-02-18 DIAGNOSIS — J209 Acute bronchitis, unspecified: Secondary | ICD-10-CM | POA: Diagnosis not present

## 2020-02-18 DIAGNOSIS — K2100 Gastro-esophageal reflux disease with esophagitis, without bleeding: Secondary | ICD-10-CM | POA: Diagnosis not present

## 2020-02-18 DIAGNOSIS — E119 Type 2 diabetes mellitus without complications: Secondary | ICD-10-CM

## 2020-02-18 DIAGNOSIS — I152 Hypertension secondary to endocrine disorders: Secondary | ICD-10-CM

## 2020-02-18 DIAGNOSIS — E1169 Type 2 diabetes mellitus with other specified complication: Secondary | ICD-10-CM

## 2020-02-18 DIAGNOSIS — J449 Chronic obstructive pulmonary disease, unspecified: Secondary | ICD-10-CM | POA: Diagnosis not present

## 2020-02-18 DIAGNOSIS — E7800 Pure hypercholesterolemia, unspecified: Secondary | ICD-10-CM | POA: Diagnosis not present

## 2020-02-18 DIAGNOSIS — H401131 Primary open-angle glaucoma, bilateral, mild stage: Secondary | ICD-10-CM

## 2020-02-18 DIAGNOSIS — I10 Essential (primary) hypertension: Secondary | ICD-10-CM

## 2020-02-18 DIAGNOSIS — J4489 Other specified chronic obstructive pulmonary disease: Secondary | ICD-10-CM

## 2020-02-18 HISTORY — DX: Type 2 diabetes mellitus with other circulatory complications: E66.9

## 2020-02-18 HISTORY — DX: Hypertension secondary to endocrine disorders: I15.2

## 2020-02-18 HISTORY — DX: Morbid (severe) obesity due to excess calories: E66.01

## 2020-02-18 LAB — PULMONARY FUNCTION TEST

## 2020-02-18 NOTE — Progress Notes (Addendum)
New Patient Office Visit  Subjective:  Patient ID: Christina Bishop, female    DOB: 1940/11/29  Age: 79 y.o. MRN: [id number]  CC:  Chief Complaint  Patient presents with  . Establish Care    HPI Christina Bishop presents for chronic visit  Patient present with type 2 diabetes.  Specifically, this is type 2, insulin requiring diabetes, complicated by hypertension and hyperlipidemia.  Compliance with treatment has been good; patient take medicines as directed, maintains diet and exercise regimen, follows up as directed, and is keeping glucose diary.  Date of  diagnosis 2010.  Depression screen has been performed.Tobacco screen nonsmoker. Current medicines for diabetes tresiba 22 units, glimipiride.  Patient is on none for renal protection and pravastatin for cholesterol control.  Patient performs foot exams daily and last ophthalmologic exam was not yet.  Patient presents for follow up of hypertension.  Patient tolerating amlodipine, clonidine, furosemide well with side effects.  Patient was diagnosed with hypertension 2010 so has been treated for hypertension for 10 years.Patient is working on maintaining diet and exercise regimen and follows up as directed. Complication include atrial fibrillation.  Patient presents with hyperlipidemia.  Compliance with treatment has been good; patient takes medicines as directed, maintains low cholesterol diet, follows up as directed, and maintains exercise regimen.  Patient is using pravastatin without problems.  Past Medical History:  Diagnosis Date  . Depression   . Glaucoma   . H/O: hysterectomy   . HTN (hypertension)   . Hypercholesterolemia   . Psoriatic arthritis ([place])   . SOB (shortness of breath) on exertion     Past Surgical History:  Procedure Laterality Date  . ABDOMINAL HYSTERECTOMY    . ABDOMINAL SURGERY    . APPENDECTOMY    . CARDIAC CATHETERIZATION    . JOINT REPLACEMENT     Both Knees  . TOTAL KNEE ARTHROPLASTY    . TUBAL  LIGATION      Family History  Problem Relation Age of Onset  . Stroke Mother   . Heart attack Father   . Heart disease Brother   . Leukemia Paternal Uncle   . Diabetes Cousin   . Leukemia Cousin     Social History   Socioeconomic History  . Marital status: Legally Separated    Spouse name: Not on file  . Number of children: 2  . Years of education: 31  . Highest education level: Not on file  Occupational History  . Occupation: office work  . Occupation: CNA  Tobacco Use  . Smoking status: Never Smoker  . Smokeless tobacco: Never Used  Substance and Sexual Activity  . Alcohol use: No  . Drug use: No  . Sexual activity: Never  Other Topics Concern  . Not on file  Social History Narrative   Patient drinks about 6 cups of caffeine daily.   Patient is right handed.   Social Determinants of Health   Financial Resource Strain:   . Difficulty of Paying Living Expenses: Not on file  Food Insecurity:   . Worried About Charity fundraiser in the Last Year: Not on file  . Ran Out of Food in the Last Year: Not on file  Transportation Needs:   . Lack of Transportation (Medical): Not on file  . Lack of Transportation (Non-Medical): Not on file  Physical Activity:   . Days of Exercise per Week: Not on file  . Minutes of Exercise per Session: Not on file  Stress:   .  Feeling of Stress : Not on file  Social Connections:   . Frequency of Communication with Friends and Family: Not on file  . Frequency of Social Gatherings with Friends and Family: Not on file  . Attends Religious Services: Not on file  . Active Member of Clubs or Organizations: Not on file  . Attends Archivist Meetings: Not on file  . Marital Status: Not on file  Intimate Partner Violence:   . Fear of Current or Ex-Partner: Not on file  . Emotionally Abused: Not on file  . Physically Abused: Not on file  . Sexually Abused: Not on file    ROS Review of Systems  Constitutional: Negative.    HENT: Negative for congestion, dental problem and drooling.   Eyes: Negative.   Respiratory: Positive for shortness of breath. Negative for chest tightness.   Cardiovascular: Positive for palpitations. Negative for chest pain.  Endocrine: Negative.   Genitourinary: Negative.   Musculoskeletal: Negative.   Skin: Negative.   Neurological: Negative.   Psychiatric/Behavioral: Negative.     Objective:   Today's Vitals: BP 120/70   Pulse 99   Temp (!) 97 F (36.1 C)   Ht 4\' 9"  (1.448 m)   Wt 199 lb 9.6 oz (90.5 kg)   SpO2 97%   BMI 43.19 kg/m   Physical Exam Vitals reviewed.  Constitutional:      Appearance: Normal appearance.  HENT:     Head: Normocephalic and atraumatic.     Right Ear: Tympanic membrane, ear canal and external ear normal.     Left Ear: Tympanic membrane, ear canal and external ear normal.     Mouth/Throat:     Mouth: Mucous membranes are moist.     Pharynx: Oropharynx is clear.  Eyes:     Conjunctiva/sclera: Conjunctivae normal.     Pupils: Pupils are equal, round, and reactive to light.  Cardiovascular:     Rate and Rhythm: Normal rate and regular rhythm.     Pulses: Normal pulses.     Heart sounds: Normal heart sounds.  Pulmonary:     Effort: Pulmonary effort is normal.     Breath sounds: Normal breath sounds.  Abdominal:     General: Abdomen is flat. Bowel sounds are normal.     Palpations: Abdomen is soft.  Musculoskeletal:        General: Normal range of motion.     Cervical back: Normal range of motion and neck supple.  Skin:    General: Skin is warm and dry.     Capillary Refill: Capillary refill takes less than 2 seconds.  Neurological:     General: No focal deficit present.     Mental Status: She is alert and oriented to person, place, and time.  Psychiatric:        Mood and Affect: Mood normal.        Thought Content: Thought content normal.        Judgment: Judgment normal.   PFT: FVC 79%, FEV1 80%, FEV1/FVC 102%, PEF 48%-  restriction Diabetic Foot Exam - Simple   Simple Foot Form Diabetic Foot exam was performed with the following findings: Yes 02/19/2020  6:42 PM  Visual Inspection No deformities, no ulcerations, no other skin breakdown bilaterally: Yes Sensation Testing Intact to touch and monofilament testing bilaterally: Yes Pulse Check Posterior Tibialis and Dorsalis pulse intact bilaterally: Yes Comments      Assessment & Plan:   Problem List Items Addressed This Visit  Cardiovascular and Mediastinum   HTN (hypertension)   Relevant Medications   pravastatin (PRAVACHOL) 40 MG tablet An individual hypertension care plan was established and reinforced today.  The patient's status was assessed using clinical findings on exam and labs or diagnostic tests. The patient's success at meeting treatment goals on disease specific evidence-based guidelines and found to be well controlled. SELF MANAGEMENT: The patient and I together assessed ways to personally work towards obtaining the recommended goals. RECOMMENDATIONS: avoid decongestants found in common cold remedies, decrease consumption of alcohol, perform routine monitoring of BP with home BP cuff, exercise, reduction of dietary salt, take medicines as prescribed, try not to miss doses and quit smoking.  Regular exercise and maintaining a healthy weight is needed.  Stress reduction may help. A CLINICAL SUMMARY including written plan identify barriers to care unique to individual due to social or financial issues.  We attempt to mutually creat solutions for individual and family understanding.   Other Relevant Orders   CBC with Differential/Platelet (Completed)   Comprehensive metabolic panel (Completed)   Obesity, diabetes, and hypertension syndrome (HCC)   Relevant Medications   pravastatin (PRAVACHOL) 40 MG tablet An individual care plan for diabetes was established and reinforced today.  The patient's status was assessed using clinical findings  on exam, labs and diagnostic testing. Patient success at meeting goals based on disease specific evidence-based guidelines and found to be good controlled. Medications were assessed and patient's understanding of the medical issues , including barriers were assessed. Recommend adherence to a diabetic diet, a graduated exercise program, HgbA1c level is checked quarterly, and urine microalbumin performed yearly .  Annual mono-filament sensation testing performed. Lower blood pressure and control hyperlipidemia is important. Get annual eye exams and annual flu shots and smoking cessation discussed.  Self management goals were discussed.   Other Relevant Orders   Hemoglobin A1c (Completed)     Other   Hypercholesterolemia - Primary   Relevant Medications   pravastatin (PRAVACHOL) 40 MG tablet AN INDIVIDUAL CARE PLAN for hyperlipidemia/ cholesterol was established and reinforced today.  The patient's status was assessed using clinical findings on exam, lab and other diagnostic tests. The patient's disease status was assessed based on evidence-based guidelines and found to be well controlled. MEDICATIONS were revieweed. SELF MANAGEMENT GOALS have been discussed and patient's success at attaining the goal of low cholesterol was assessed. RECOMMENDATION given include regular exercise 3 days a week and low cholesterol/low fat diet. CLINICAL SUMMARY including written plan to identify barriers unique to the patient due to social or economic  reasons was discussed.   Other Relevant Orders   Lipid panel (Completed)   Morbid obesity ([place]) An individualize plan was formulated for obesity using patient history and physical exam to encourage weight loss.  An evidence based program was formulated.  Patient is to cut portion size with meals and to plan physical exercise 3 days a week at least 20 minutes.  Weight watchers and other programs are helpful.  Planned amount of weight loss 10 lbs.    Other Visit Diagnoses     Acute bronchitis with COPD ([place])       Chronic obstructive bronchitis ([place])     An individualize plan was formulated for care of COPD.  Treatment is evidence based.  She will continue on inhalers, avoid smoking and smoke.  Regular exercise with help with dyspnea. Routine follow ups and medication compliance is needed.      Outpatient Encounter Medications as of 02/18/2020  Medication  Sig  . ACCU-CHEK AVIVA PLUS test strip   . Accu-Chek Softclix Lancets lancets   . acetaminophen (TYLENOL) 325 MG tablet Take 2 tablets (650 mg total) by mouth every 4 (four) hours as needed for headache or mild pain.  . Alcohol Swabs (B-D SINGLE USE SWABS REGULAR) PADS   . amLODipine (NORVASC) 10 MG tablet Take 1 tablet (10 mg total) by mouth daily.  . Blood Glucose Calibration (ACCU-CHEK AVIVA) SOLN   . brimonidine (ALPHAGAN) 0.2 % ophthalmic solution   . cloNIDine (CATAPRES) 0.2 MG tablet Take 1 tablet (0.2 mg total) by mouth 2 (two) times daily.  . dorzolamide-timolol (COSOPT) 22.3-6.8 MG/ML ophthalmic solution   . esomeprazole (NEXIUM) 40 MG capsule   . furosemide (LASIX) 20 MG tablet Take 1 tablet (20 mg total) by mouth daily.  Marland Kitchen glimepiride (AMARYL) 1 MG tablet Take 1 mg by mouth daily with breakfast.  . insulin degludec (TRESIBA FLEXTOUCH) 100 UNIT/ML SOPN FlexTouch Pen Inject 24 Units into the skin daily.   . Insulin Pen Needle (NOVOFINE PLUS) 32G X 4 MM MISC by Does not apply route.  . latanoprost (XALATAN) 0.005 % ophthalmic solution   . pravastatin (PRAVACHOL) 40 MG tablet   . predniSONE (DELTASONE) 5 MG tablet Take 1 tablet by mouth daily as needed.  . sertraline (ZOLOFT) 100 MG tablet Take 100 mg by mouth daily.   . [DISCONTINUED] Brimonidine Tartrate-Timolol (COMBIGAN OP) Place 1 drop into both eyes 2 (two) times daily.   . [DISCONTINUED] Ca Carbonate-Mag Hydroxide (ROLAIDS PO) Take 1-2 tablets by mouth daily as needed (reflux).  . [DISCONTINUED] moxifloxacin (VIGAMOX) 0.5 % ophthalmic  solution INT 1 GTT INTO RIGHT EYE QID  . [DISCONTINUED] travoprost, benzalkonium, (TRAVATAN) 0.004 % ophthalmic solution Place 1 drop into both eyes at bedtime.   No facility-administered encounter medications on file as of 02/18/2020.    Follow-up: Return in about 3 months (around 05/20/2020) for fasting.   Reinaldo Meeker, MD

## 2020-02-19 LAB — LIPID PANEL
Chol/HDL Ratio: 2.2 ratio (ref 0.0–4.4)
Cholesterol, Total: 182 mg/dL (ref 100–199)
HDL: 82 mg/dL (ref >39)
LDL Chol Calc (NIH): 82 mg/dL (ref 0–99)
Triglycerides: 101 mg/dL (ref 0–149)
VLDL Cholesterol Cal: 18 mg/dL (ref 5–40)

## 2020-02-19 LAB — HEMOGLOBIN A1C
Est. average glucose Bld gHb Est-mCnc: 240 mg/dL
Hgb A1c MFr Bld: 10 % — ABNORMAL HIGH (ref 4.8–5.6)

## 2020-02-19 LAB — CBC WITH DIFFERENTIAL/PLATELET
Basophils Absolute: 0.1 10*3/uL (ref 0.0–0.2)
Basos: 1 %
EOS (ABSOLUTE): 0.4 10*3/uL (ref 0.0–0.4)
Eos: 4 %
Hematocrit: 45.9 % (ref 34.0–46.6)
Hemoglobin: 15.8 g/dL (ref 11.1–15.9)
Immature Grans (Abs): 0 10*3/uL (ref 0.0–0.1)
Immature Granulocytes: 0 %
Lymphocytes Absolute: 2.4 10*3/uL (ref 0.7–3.1)
Lymphs: 27 %
MCH: 28.6 pg (ref 26.6–33.0)
MCHC: 34.4 g/dL (ref 31.5–35.7)
MCV: 83 fL (ref 79–97)
Monocytes Absolute: 0.8 10*3/uL (ref 0.1–0.9)
Monocytes: 8 %
Neutrophils Absolute: 5.4 10*3/uL (ref 1.4–7.0)
Neutrophils: 60 %
Platelets: 420 10*3/uL (ref 150–450)
RBC: 5.53 x10E6/uL — ABNORMAL HIGH (ref 3.77–5.28)
RDW: 13.4 % (ref 11.7–15.4)
WBC: 9.1 10*3/uL (ref 3.4–10.8)

## 2020-02-19 LAB — COMPREHENSIVE METABOLIC PANEL
ALT: 17 IU/L (ref 0–32)
AST: 21 IU/L (ref 0–40)
Albumin/Globulin Ratio: 2 (ref 1.2–2.2)
Albumin: 4.6 g/dL (ref 3.7–4.7)
Alkaline Phosphatase: 115 IU/L (ref 48–121)
BUN/Creatinine Ratio: 12 (ref 12–28)
BUN: 11 mg/dL (ref 8–27)
Bilirubin Total: 0.4 mg/dL (ref 0.0–1.2)
CO2: 25 mmol/L (ref 20–29)
Calcium: 9.5 mg/dL (ref 8.7–10.3)
Chloride: 101 mmol/L (ref 96–106)
Creatinine, Ser: 0.9 mg/dL (ref 0.57–1.00)
GFR calc Af Amer: 71 mL/min/{1.73_m2} (ref >59)
GFR calc non Af Amer: 62 mL/min/{1.73_m2} (ref >59)
Globulin, Total: 2.3 g/dL (ref 1.5–4.5)
Glucose: 123 mg/dL — ABNORMAL HIGH (ref 65–99)
Potassium: 4.1 mmol/L (ref 3.5–5.2)
Sodium: 141 mmol/L (ref 134–144)
Total Protein: 6.9 g/dL (ref 6.0–8.5)

## 2020-02-19 LAB — CARDIOVASCULAR RISK ASSESSMENT

## 2020-02-19 NOTE — Progress Notes (Signed)
CBC no changes, glucose, kidney tests normal, liver tests normal, Cholesterol normal, A1c 10.0- very poor recommend starting Ozempic to lower glucose, it will help weight.  Glimipiride is probably not working since you are on insulin lp

## 2020-02-23 ENCOUNTER — Ambulatory Visit (INDEPENDENT_AMBULATORY_CARE_PROVIDER_SITE_OTHER): Payer: Medicare HMO | Admitting: Legal Medicine

## 2020-02-23 ENCOUNTER — Encounter: Payer: Self-pay | Admitting: Legal Medicine

## 2020-02-23 ENCOUNTER — Other Ambulatory Visit: Payer: Self-pay

## 2020-02-23 VITALS — BP 134/90 | HR 83 | Temp 97.2°F | Resp 18 | Ht <= 58 in | Wt 199.6 lb

## 2020-02-23 DIAGNOSIS — J441 Chronic obstructive pulmonary disease with (acute) exacerbation: Secondary | ICD-10-CM

## 2020-02-23 DIAGNOSIS — E669 Obesity, unspecified: Secondary | ICD-10-CM

## 2020-02-23 DIAGNOSIS — E1169 Type 2 diabetes mellitus with other specified complication: Secondary | ICD-10-CM

## 2020-02-23 DIAGNOSIS — E119 Type 2 diabetes mellitus without complications: Secondary | ICD-10-CM

## 2020-02-23 DIAGNOSIS — J209 Acute bronchitis, unspecified: Secondary | ICD-10-CM

## 2020-02-23 DIAGNOSIS — J440 Chronic obstructive pulmonary disease with acute lower respiratory infection: Secondary | ICD-10-CM

## 2020-02-23 DIAGNOSIS — J449 Chronic obstructive pulmonary disease, unspecified: Secondary | ICD-10-CM

## 2020-02-23 DIAGNOSIS — I10 Essential (primary) hypertension: Secondary | ICD-10-CM

## 2020-02-23 DIAGNOSIS — I152 Hypertension secondary to endocrine disorders: Secondary | ICD-10-CM

## 2020-02-23 DIAGNOSIS — E1159 Type 2 diabetes mellitus with other circulatory complications: Secondary | ICD-10-CM

## 2020-02-23 HISTORY — DX: Chronic obstructive pulmonary disease, unspecified: J44.9

## 2020-02-23 MED ORDER — LEVOFLOXACIN 500 MG PO TABS: 500.0000 mg | ORAL_TABLET | Freq: Every day | ORAL | 0 refills | Status: DC

## 2020-02-23 MED ORDER — BREZTRI AEROSPHERE 160-9-4.8 MCG/ACT IN AERO: 2.0000 | INHALATION_SPRAY | Freq: Two times a day (BID) | RESPIRATORY_TRACT | 5 refills | Status: AC

## 2020-02-23 NOTE — Patient Instructions (Signed)

## 2020-02-23 NOTE — Progress Notes (Signed)
Acute Office Visit  Subjective:    Patient ID: Christina Bishop, female    DOB: 05/03/1941, 79 y.o.   MRN: [id number]  Chief Complaint  Patient presents with  . Diabetes    HPI Patient is in today for uncontrolled diabetes.  She is on tresiba 24 units but get BS 80 in AM.  A1c 10.0- we discussed starting ozempic to lower BS.  She is having cough and wheezing.  She was using anoro but no covered by insurance.   Past Medical History:  Diagnosis Date  . Depression   . Glaucoma   . H/O: hysterectomy   . HTN (hypertension)   . Hypercholesterolemia   . Psoriatic arthritis ([place])   . SOB (shortness of breath) on exertion     Past Surgical History:  Procedure Laterality Date  . ABDOMINAL HYSTERECTOMY    . ABDOMINAL SURGERY    . APPENDECTOMY    . CARDIAC CATHETERIZATION    . JOINT REPLACEMENT     Both Knees  . TOTAL KNEE ARTHROPLASTY    . TUBAL LIGATION      Family History  Problem Relation Age of Onset  . Stroke Mother   . Heart attack Father   . Heart disease Brother   . Leukemia Paternal Uncle   . Diabetes Cousin   . Leukemia Cousin     Social History   Socioeconomic History  . Marital status: Legally Separated    Spouse name: Not on file  . Number of children: 2  . Years of education: 69  . Highest education level: Not on file  Occupational History  . Occupation: office work  . Occupation: CNA  Tobacco Use  . Smoking status: Never Smoker  . Smokeless tobacco: Never Used  Substance and Sexual Activity  . Alcohol use: No  . Drug use: No  . Sexual activity: Never  Other Topics Concern  . Not on file  Social History Narrative   Patient drinks about 6 cups of caffeine daily.   Patient is right handed.   Social Determinants of Health   Financial Resource Strain:   . Difficulty of Paying Living Expenses: Not on file  Food Insecurity:   . Worried About Charity fundraiser in the Last Year: Not on file  . Ran Out of Food in the Last Year: Not on  file  Transportation Needs:   . Lack of Transportation (Medical): Not on file  . Lack of Transportation (Non-Medical): Not on file  Physical Activity:   . Days of Exercise per Week: Not on file  . Minutes of Exercise per Session: Not on file  Stress:   . Feeling of Stress : Not on file  Social Connections:   . Frequency of Communication with Friends and Family: Not on file  . Frequency of Social Gatherings with Friends and Family: Not on file  . Attends Religious Services: Not on file  . Active Member of Clubs or Organizations: Not on file  . Attends Archivist Meetings: Not on file  . Marital Status: Not on file  Intimate Partner Violence:   . Fear of Current or Ex-Partner: Not on file  . Emotionally Abused: Not on file  . Physically Abused: Not on file  . Sexually Abused: Not on file    Outpatient Medications Prior to Visit  Medication Sig Dispense Refill  . ACCU-CHEK AVIVA PLUS test strip     . Accu-Chek Softclix Lancets lancets     . acetaminophen (  TYLENOL) 325 MG tablet Take 2 tablets (650 mg total) by mouth every 4 (four) hours as needed for headache or mild pain.    . Alcohol Swabs (B-D SINGLE USE SWABS REGULAR) PADS     . amLODipine (NORVASC) 10 MG tablet Take 1 tablet (10 mg total) by mouth daily. 90 tablet 2  . Blood Glucose Calibration (ACCU-CHEK AVIVA) SOLN     . brimonidine (ALPHAGAN) 0.2 % ophthalmic solution     . cloNIDine (CATAPRES) 0.2 MG tablet Take 1 tablet (0.2 mg total) by mouth 2 (two) times daily. 180 tablet 3  . dorzolamide-timolol (COSOPT) 22.3-6.8 MG/ML ophthalmic solution     . esomeprazole (NEXIUM) 40 MG capsule     . furosemide (LASIX) 20 MG tablet Take 1 tablet (20 mg total) by mouth daily. 90 tablet 2  . insulin degludec (TRESIBA FLEXTOUCH) 100 UNIT/ML SOPN FlexTouch Pen Inject 24 Units into the skin daily.     . Insulin Pen Needle (NOVOFINE PLUS) 32G X 4 MM MISC by Does not apply route.    . latanoprost (XALATAN) 0.005 % ophthalmic  solution     . pravastatin (PRAVACHOL) 40 MG tablet     . predniSONE (DELTASONE) 5 MG tablet Take 1 tablet by mouth daily as needed.    . sertraline (ZOLOFT) 100 MG tablet Take 100 mg by mouth daily.     Marland Kitchen glimepiride (AMARYL) 1 MG tablet Take 1 mg by mouth daily with breakfast.     No facility-administered medications prior to visit.    Allergies  Allergen Reactions  . Ace Inhibitors     dyspnea  . Codeine Itching  . Metoprolol Other (See Comments)    Hallucinations    Review of Systems  HENT: Negative.   Eyes: Negative.   Respiratory: Positive for cough, shortness of breath and wheezing.   Cardiovascular: Negative.   Gastrointestinal: Negative.   Endocrine: Negative.   Genitourinary: Negative.   Neurological: Negative.        Objective:    Physical Exam Constitutional:      Appearance: Normal appearance.  HENT:     Right Ear: Tympanic membrane, ear canal and external ear normal.     Left Ear: Tympanic membrane, ear canal and external ear normal.  Eyes:     Conjunctiva/sclera: Conjunctivae normal.     Pupils: Pupils are equal, round, and reactive to light.  Cardiovascular:     Rate and Rhythm: Normal rate and regular rhythm.     Pulses: Normal pulses.     Heart sounds: Normal heart sounds.  Pulmonary:     Breath sounds: Wheezing present.  Abdominal:     General: Abdomen is flat.     Palpations: Abdomen is soft.  Musculoskeletal:        General: Normal range of motion.     Cervical back: Normal range of motion. Tenderness present.  Skin:    General: Skin is warm and dry.     Capillary Refill: Capillary refill takes less than 2 seconds.  Neurological:     Mental Status: She is alert and oriented to person, place, and time.     BP 134/90   Pulse 83   Temp (!) 97.2 F (36.2 C)   Resp 18   Ht 4\' 9"  (1.448 m)   Wt 199 lb 9.6 oz (90.5 kg)   SpO2 96%   BMI 43.19 kg/m  Wt Readings from Last 3 Encounters:  02/23/20 199 lb 9.6 oz (90.5 kg)  02/18/20 199  lb 9.6 oz (90.5 kg)  05/11/19 200 lb 9.6 oz (91 kg)    Health Maintenance Due  Topic Date Due  . Hepatitis C Screening  Never done  . OPHTHALMOLOGY EXAM  Never done  . COVID-19 Vaccine (1) Never done  . TETANUS/TDAP  Never done  . DEXA SCAN  Never done  . PNA vac Low Risk Adult (2 of 2 - PCV13) 05/05/2013  . INFLUENZA VACCINE  01/24/2020      Lab Results  Component Value Date   TSH 4.010 03/26/2014   Lab Results  Component Value Date   WBC 9.1 02/18/2020   HGB 15.8 02/18/2020   HCT 45.9 02/18/2020   MCV 83 02/18/2020   PLT 420 02/18/2020   Lab Results  Component Value Date   NA 141 02/18/2020   K 4.1 02/18/2020   CO2 25 02/18/2020   GLUCOSE 123 (H) 02/18/2020   BUN 11 02/18/2020   CREATININE 0.90 02/18/2020   BILITOT 0.4 02/18/2020   ALKPHOS 115 02/18/2020   AST 21 02/18/2020   ALT 17 02/18/2020   PROT 6.9 02/18/2020   ALBUMIN 4.6 02/18/2020   CALCIUM 9.5 02/18/2020   ANIONGAP 12 02/14/2015   Lab Results  Component Value Date   CHOL 182 02/18/2020   Lab Results  Component Value Date   HDL 82 02/18/2020   Lab Results  Component Value Date   LDLCALC 82 02/18/2020   Lab Results  Component Value Date   TRIG 101 02/18/2020   Lab Results  Component Value Date   CHOLHDL 2.2 02/18/2020   Lab Results  Component Value Date   HGBA1C 10.0 (H) 02/18/2020       Assessment & Plan:  1. Obstructive chronic bronchitis with exacerbation (HCC) - levofloxacin (LEVAQUIN) 500 MG tablet; Take 1 tablet (500 mg total) by mouth daily.  Dispense: 7 tablet; Refill: 0 - Budeson-Glycopyrrol-Formoterol (BREZTRI AEROSPHERE) 160-9-4.8 MCG/ACT AERO; Inhale 2 puffs into the lungs in the morning and at bedtime.  Dispense: 10.7 g; Refill: 5 Patient is having a flair of COPD, I started her on samples and Rx of Breztri, duoneb given with relief of wheezing.  Levaquin called in for infection.  2. Obesity, diabetes, and hypertension syndrome ([place]) Patient is having A1c 10.0, she  needs better control.  Start ozempic 0.5mg  qweek   Meds ordered this encounter  Medications  . levofloxacin (LEVAQUIN) 500 MG tablet    Sig: Take 1 tablet (500 mg total) by mouth daily.    Dispense:  7 tablet    Refill:  0  . Budeson-Glycopyrrol-Formoterol (BREZTRI AEROSPHERE) 160-9-4.8 MCG/ACT AERO    Sig: Inhale 2 puffs into the lungs in the morning and at bedtime.    Dispense:  10.7 g    Refill:  5    No orders of the defined types were placed in this encounter.    Follow-up: Return in about 1 month (around 03/24/2020).  An After Visit Summary was printed and given to the patient.  [place] [phone]

## 2020-03-01 ENCOUNTER — Encounter: Payer: Self-pay | Admitting: Legal Medicine

## 2020-03-01 ENCOUNTER — Ambulatory Visit (INDEPENDENT_AMBULATORY_CARE_PROVIDER_SITE_OTHER): Payer: Medicare HMO | Admitting: Legal Medicine

## 2020-03-01 VITALS — BP 120/70 | HR 138 | Temp 96.4°F

## 2020-03-01 DIAGNOSIS — J209 Acute bronchitis, unspecified: Secondary | ICD-10-CM

## 2020-03-01 DIAGNOSIS — R112 Nausea with vomiting, unspecified: Secondary | ICD-10-CM | POA: Diagnosis not present

## 2020-03-01 DIAGNOSIS — J440 Chronic obstructive pulmonary disease with acute lower respiratory infection: Secondary | ICD-10-CM | POA: Diagnosis not present

## 2020-03-01 DIAGNOSIS — R1110 Vomiting, unspecified: Secondary | ICD-10-CM

## 2020-03-01 DIAGNOSIS — I480 Paroxysmal atrial fibrillation: Secondary | ICD-10-CM

## 2020-03-01 HISTORY — DX: Vomiting, unspecified: R11.10

## 2020-03-01 LAB — POC COVID19 BINAXNOW: SARS Coronavirus 2 Ag: NEGATIVE

## 2020-03-01 MED ORDER — ONDANSETRON HCL 8 MG PO TABS: 8.0000 mg | ORAL_TABLET | Freq: Three times a day (TID) | ORAL | 0 refills | Status: DC | PRN

## 2020-03-01 MED ORDER — PROMETHAZINE HCL 25 MG/ML IJ SOLN
25.0000 mg | Freq: Once | INTRAMUSCULAR | Status: AC
  Administered 2020-03-01: 25 mg via INTRAMUSCULAR

## 2020-03-01 NOTE — Progress Notes (Signed)
Acute Office Visit  Subjective:    Patient ID: VIA ROSADO, female    DOB: 1940/09/05, 79 y.o.   MRN: [id number]  Chief Complaint  Patient presents with  . Emesis    HPI Patient is in today for nausea and vomiting for 4 days after taking firt dose of Ozempic.  He COPD is causing her to be dyspneic but not coughing, no fever.  Her heart rate is probably due to dehydration.  She refuses to go to hospital for at least rehydration.  Rapid covid is negative.  Past Medical History:  Diagnosis Date  . Depression   . Glaucoma   . H/O: hysterectomy   . HTN (hypertension)   . Hypercholesterolemia   . Psoriatic arthritis ([place])   . SOB (shortness of breath) on exertion     Past Surgical History:  Procedure Laterality Date  . ABDOMINAL HYSTERECTOMY    . ABDOMINAL SURGERY    . APPENDECTOMY    . CARDIAC CATHETERIZATION    . JOINT REPLACEMENT     Both Knees  . TOTAL KNEE ARTHROPLASTY    . TUBAL LIGATION      Family History  Problem Relation Age of Onset  . Stroke Mother   . Heart attack Father   . Heart disease Brother   . Leukemia Paternal Uncle   . Diabetes Cousin   . Leukemia Cousin     Social History   Socioeconomic History  . Marital status: Legally Separated    Spouse name: Not on file  . Number of children: 2  . Years of education: 41  . Highest education level: Not on file  Occupational History  . Occupation: office work  . Occupation: CNA  Tobacco Use  . Smoking status: Never Smoker  . Smokeless tobacco: Never Used  Substance and Sexual Activity  . Alcohol use: No  . Drug use: No  . Sexual activity: Never  Other Topics Concern  . Not on file  Social History Narrative   Patient drinks about 6 cups of caffeine daily.   Patient is right handed.   Social Determinants of Health   Financial Resource Strain:   . Difficulty of Paying Living Expenses: Not on file  Food Insecurity:   . Worried About Charity fundraiser in the Last Year: Not on file   . Ran Out of Food in the Last Year: Not on file  Transportation Needs:   . Lack of Transportation (Medical): Not on file  . Lack of Transportation (Non-Medical): Not on file  Physical Activity:   . Days of Exercise per Week: Not on file  . Minutes of Exercise per Session: Not on file  Stress:   . Feeling of Stress : Not on file  Social Connections:   . Frequency of Communication with Friends and Family: Not on file  . Frequency of Social Gatherings with Friends and Family: Not on file  . Attends Religious Services: Not on file  . Active Member of Clubs or Organizations: Not on file  . Attends Archivist Meetings: Not on file  . Marital Status: Not on file  Intimate Partner Violence:   . Fear of Current or Ex-Partner: Not on file  . Emotionally Abused: Not on file  . Physically Abused: Not on file  . Sexually Abused: Not on file    Outpatient Medications Prior to Visit  Medication Sig Dispense Refill  . ACCU-CHEK AVIVA PLUS test strip     . Accu-Chek Softclix  Lancets lancets     . acetaminophen (TYLENOL) 325 MG tablet Take 2 tablets (650 mg total) by mouth every 4 (four) hours as needed for headache or mild pain.    . Alcohol Swabs (B-D SINGLE USE SWABS REGULAR) PADS     . amLODipine (NORVASC) 10 MG tablet Take 1 tablet (10 mg total) by mouth daily. 90 tablet 2  . Blood Glucose Calibration (ACCU-CHEK AVIVA) SOLN     . brimonidine (ALPHAGAN) 0.2 % ophthalmic solution     . Budeson-Glycopyrrol-Formoterol (BREZTRI AEROSPHERE) 160-9-4.8 MCG/ACT AERO Inhale 2 puffs into the lungs in the morning and at bedtime. 10.7 g 5  . cloNIDine (CATAPRES) 0.2 MG tablet Take 1 tablet (0.2 mg total) by mouth 2 (two) times daily. 180 tablet 3  . dorzolamide-timolol (COSOPT) 22.3-6.8 MG/ML ophthalmic solution     . esomeprazole (NEXIUM) 40 MG capsule     . furosemide (LASIX) 20 MG tablet Take 1 tablet (20 mg total) by mouth daily. 90 tablet 2  . insulin degludec (TRESIBA FLEXTOUCH) 100  UNIT/ML SOPN FlexTouch Pen Inject 24 Units into the skin daily.     . Insulin Pen Needle (NOVOFINE PLUS) 32G X 4 MM MISC by Does not apply route.    . latanoprost (XALATAN) 0.005 % ophthalmic solution     . levofloxacin (LEVAQUIN) 500 MG tablet Take 1 tablet (500 mg total) by mouth daily. 7 tablet 0  . pravastatin (PRAVACHOL) 40 MG tablet     . predniSONE (DELTASONE) 5 MG tablet Take 1 tablet by mouth daily as needed.    . sertraline (ZOLOFT) 100 MG tablet Take 100 mg by mouth daily.      No facility-administered medications prior to visit.    Allergies  Allergen Reactions  . Ace Inhibitors     dyspnea  . Codeine Itching  . Metoprolol Other (See Comments)    Hallucinations    Review of Systems  Constitutional: Negative.   Respiratory: Positive for shortness of breath and wheezing.   Cardiovascular: Negative for chest pain, palpitations and leg swelling.  Gastrointestinal: Positive for nausea and vomiting.  Genitourinary: Negative.   Musculoskeletal: Negative.   Neurological: Negative.   Psychiatric/Behavioral: Negative.        Objective:    Physical Exam Vitals reviewed.  Constitutional:      Appearance: Normal appearance.  Eyes:     Extraocular Movements: Extraocular movements intact.     Conjunctiva/sclera: Conjunctivae normal.     Pupils: Pupils are equal, round, and reactive to light.  Cardiovascular:     Rate and Rhythm: Normal rate. Rhythm irregular.     Heart sounds: Normal heart sounds.  Pulmonary:     Breath sounds: Rhonchi present.  Abdominal:     General: Abdomen is flat. Bowel sounds are normal.     Palpations: Abdomen is soft.  Musculoskeletal:     Cervical back: Normal range of motion and neck supple.  Neurological:     Mental Status: She is alert and oriented to person, place, and time. Mental status is at baseline.     BP 120/70   Pulse (!) 138   Temp (!) 96.4 F (35.8 C)  Wt Readings from Last 3 Encounters:  02/23/20 199 lb 9.6 oz (90.5  kg)  02/18/20 199 lb 9.6 oz (90.5 kg)  05/11/19 200 lb 9.6 oz (91 kg)    Health Maintenance Due  Topic Date Due  . Hepatitis C Screening  Never done  . OPHTHALMOLOGY EXAM  Never done  .  COVID-19 Vaccine (1) Never done  . TETANUS/TDAP  Never done  . DEXA SCAN  Never done  . PNA vac Low Risk Adult (2 of 2 - PCV13) 05/05/2013  . INFLUENZA VACCINE  01/24/2020    There are no preventive care reminders to display for this patient.   Lab Results  Component Value Date   TSH 4.010 03/26/2014   Lab Results  Component Value Date   WBC 9.1 02/18/2020   HGB 15.8 02/18/2020   HCT 45.9 02/18/2020   MCV 83 02/18/2020   PLT 420 02/18/2020   Lab Results  Component Value Date   NA 141 02/18/2020   K 4.1 02/18/2020   CO2 25 02/18/2020   GLUCOSE 123 (H) 02/18/2020   BUN 11 02/18/2020   CREATININE 0.90 02/18/2020   BILITOT 0.4 02/18/2020   ALKPHOS 115 02/18/2020   AST 21 02/18/2020   ALT 17 02/18/2020   PROT 6.9 02/18/2020   ALBUMIN 4.6 02/18/2020   CALCIUM 9.5 02/18/2020   ANIONGAP 12 02/14/2015   Lab Results  Component Value Date   CHOL 182 02/18/2020   Lab Results  Component Value Date   HDL 82 02/18/2020   Lab Results  Component Value Date   LDLCALC 82 02/18/2020   Lab Results  Component Value Date   TRIG 101 02/18/2020   Lab Results  Component Value Date   CHOLHDL 2.2 02/18/2020   Lab Results  Component Value Date   HGBA1C 10.0 (H) 02/18/2020       Assessment & Plan:  1. Intractable vomiting with nausea, unspecified vomiting type patient has had chronic vomiting for 4 days after first ozempic, stop Ozempic, negative POC covid test.  Phenergan 25mg  IM given and zofran 8mg  for nausea at home  2. Paroxysmal atrial fibrillation (HCC) AN INDIVIDUAL CARE PLAN for atrial fibrillation was established and reinforced today.  The patient's status was assessed using clinical findings on exam, labs, and other diagnostic testing. Patient's success at meeting treatment  goals based on disease specific evidence-bassed guidelines and found to be in fair control. RECOMMENDATIONS include maintaining present medicines and treatment.  3. Acute bronchitis with COPD ([place]) An individualize plan was formulated for care of COPD.  Treatment is evidence based.  She will continue on inhalers, avoid smoking and smoke.  Regular exercise with help with dyspnea. Routine follow ups and medication compliance is needed.         Follow-up: Return in about 1 week (around 03/08/2020), or sooner is not improving.  An After Visit Summary was printed and given to the patient.  [place] [phone]

## 2020-03-02 ENCOUNTER — Encounter (HOSPITAL_COMMUNITY): Payer: Self-pay | Admitting: Emergency Medicine

## 2020-03-02 ENCOUNTER — Other Ambulatory Visit: Payer: Self-pay

## 2020-03-02 ENCOUNTER — Observation Stay (HOSPITAL_COMMUNITY)
Admission: EM | Admit: 2020-03-02 | Discharge: 2020-03-05 | Disposition: A | Discharge status: Home / Self Care | Payer: Medicare HMO | Attending: Internal Medicine | Admitting: Internal Medicine

## 2020-03-02 DIAGNOSIS — N179 Acute kidney failure, unspecified: Secondary | ICD-10-CM | POA: Diagnosis not present

## 2020-03-02 DIAGNOSIS — R112 Nausea with vomiting, unspecified: Secondary | ICD-10-CM | POA: Diagnosis not present

## 2020-03-02 DIAGNOSIS — Z20822 Contact with and (suspected) exposure to covid-19: Secondary | ICD-10-CM | POA: Insufficient documentation

## 2020-03-02 DIAGNOSIS — R8281 Pyuria: Secondary | ICD-10-CM | POA: Insufficient documentation

## 2020-03-02 DIAGNOSIS — I4891 Unspecified atrial fibrillation: Secondary | ICD-10-CM | POA: Diagnosis present

## 2020-03-02 DIAGNOSIS — R1111 Vomiting without nausea: Secondary | ICD-10-CM | POA: Diagnosis not present

## 2020-03-02 DIAGNOSIS — E119 Type 2 diabetes mellitus without complications: Secondary | ICD-10-CM | POA: Diagnosis not present

## 2020-03-02 DIAGNOSIS — I110 Hypertensive heart disease with heart failure: Secondary | ICD-10-CM | POA: Diagnosis not present

## 2020-03-02 DIAGNOSIS — R6510 Systemic inflammatory response syndrome (SIRS) of non-infectious origin without acute organ dysfunction: Secondary | ICD-10-CM | POA: Diagnosis not present

## 2020-03-02 DIAGNOSIS — Z79899 Other long term (current) drug therapy: Secondary | ICD-10-CM | POA: Diagnosis not present

## 2020-03-02 DIAGNOSIS — Z96653 Presence of artificial knee joint, bilateral: Secondary | ICD-10-CM | POA: Diagnosis not present

## 2020-03-02 DIAGNOSIS — J449 Chronic obstructive pulmonary disease, unspecified: Secondary | ICD-10-CM | POA: Diagnosis not present

## 2020-03-02 DIAGNOSIS — R110 Nausea: Secondary | ICD-10-CM | POA: Diagnosis not present

## 2020-03-02 DIAGNOSIS — Z794 Long term (current) use of insulin: Secondary | ICD-10-CM | POA: Diagnosis not present

## 2020-03-02 DIAGNOSIS — I5032 Chronic diastolic (congestive) heart failure: Secondary | ICD-10-CM | POA: Diagnosis not present

## 2020-03-02 DIAGNOSIS — E1165 Type 2 diabetes mellitus with hyperglycemia: Secondary | ICD-10-CM | POA: Diagnosis not present

## 2020-03-02 DIAGNOSIS — I10 Essential (primary) hypertension: Secondary | ICD-10-CM | POA: Diagnosis not present

## 2020-03-02 DIAGNOSIS — R1110 Vomiting, unspecified: Secondary | ICD-10-CM | POA: Diagnosis present

## 2020-03-02 DIAGNOSIS — A419 Sepsis, unspecified organism: Secondary | ICD-10-CM | POA: Diagnosis not present

## 2020-03-02 DIAGNOSIS — I160 Hypertensive urgency: Secondary | ICD-10-CM | POA: Diagnosis present

## 2020-03-02 LAB — URINALYSIS, ROUTINE W REFLEX MICROSCOPIC
Bilirubin Urine: NEGATIVE
Glucose, UA: 50 mg/dL — AB
Hgb urine dipstick: NEGATIVE
Ketones, ur: 5 mg/dL — AB
Nitrite: NEGATIVE
Protein, ur: >=300 mg/dL — AB
Specific Gravity, Urine: 1.025 (ref 1.005–1.030)
WBC, UA: >50 WBC/hpf — ABNORMAL HIGH (ref 0–5)
pH: 5 (ref 5.0–8.0)

## 2020-03-02 LAB — COMPREHENSIVE METABOLIC PANEL
ALT: 17 U/L (ref 0–44)
AST: 24 U/L (ref 15–41)
Albumin: 4.6 g/dL (ref 3.5–5.0)
Alkaline Phosphatase: 76 U/L (ref 38–126)
Anion gap: 19 — ABNORMAL HIGH (ref 5–15)
BUN: 45 mg/dL — ABNORMAL HIGH (ref 8–23)
CO2: 23 mmol/L (ref 22–32)
Calcium: 10 mg/dL (ref 8.9–10.3)
Chloride: 91 mmol/L — ABNORMAL LOW (ref 98–111)
Creatinine, Ser: 1.7 mg/dL — ABNORMAL HIGH (ref 0.44–1.00)
GFR calc Af Amer: 33 mL/min — ABNORMAL LOW (ref >60)
GFR calc non Af Amer: 29 mL/min — ABNORMAL LOW (ref >60)
Glucose, Bld: 305 mg/dL — ABNORMAL HIGH (ref 70–99)
Potassium: 3.5 mmol/L (ref 3.5–5.1)
Sodium: 133 mmol/L — ABNORMAL LOW (ref 135–145)
Total Bilirubin: 1.1 mg/dL (ref 0.3–1.2)
Total Protein: 7.8 g/dL (ref 6.5–8.1)

## 2020-03-02 LAB — CBC
HCT: 51.1 % — ABNORMAL HIGH (ref 36.0–46.0)
Hemoglobin: 17 g/dL — ABNORMAL HIGH (ref 12.0–15.0)
MCH: 27.6 pg (ref 26.0–34.0)
MCHC: 33.3 g/dL (ref 30.0–36.0)
MCV: 83 fL (ref 80.0–100.0)
Platelets: 473 10*3/uL — ABNORMAL HIGH (ref 150–400)
RBC: 6.16 MIL/uL — ABNORMAL HIGH (ref 3.87–5.11)
RDW: 13.2 % (ref 11.5–15.5)
WBC: 14 10*3/uL — ABNORMAL HIGH (ref 4.0–10.5)
nRBC: 0 % (ref 0.0–0.2)

## 2020-03-02 LAB — LIPASE, BLOOD: Lipase: 31 U/L (ref 11–51)

## 2020-03-02 NOTE — ED Triage Notes (Signed)
Patient arrives to ED with complaints of nausea and vomiting since Saturday. Pt stated she started a new diabetes medication on Saturday called Ozempic and shew started vomiting after the first dose. Pt denies abdominal pain. Pt states that the color to her vomit is dark brown.

## 2020-03-03 ENCOUNTER — Encounter (HOSPITAL_COMMUNITY): Payer: Self-pay | Admitting: Family Medicine

## 2020-03-03 DIAGNOSIS — I160 Hypertensive urgency: Secondary | ICD-10-CM | POA: Diagnosis not present

## 2020-03-03 DIAGNOSIS — N179 Acute kidney failure, unspecified: Secondary | ICD-10-CM

## 2020-03-03 DIAGNOSIS — I5032 Chronic diastolic (congestive) heart failure: Secondary | ICD-10-CM

## 2020-03-03 DIAGNOSIS — R112 Nausea with vomiting, unspecified: Secondary | ICD-10-CM

## 2020-03-03 DIAGNOSIS — J42 Unspecified chronic bronchitis: Secondary | ICD-10-CM

## 2020-03-03 DIAGNOSIS — E119 Type 2 diabetes mellitus without complications: Secondary | ICD-10-CM | POA: Diagnosis not present

## 2020-03-03 DIAGNOSIS — Z794 Long term (current) use of insulin: Secondary | ICD-10-CM | POA: Diagnosis not present

## 2020-03-03 DIAGNOSIS — I480 Paroxysmal atrial fibrillation: Secondary | ICD-10-CM | POA: Diagnosis not present

## 2020-03-03 HISTORY — DX: Acute kidney failure, unspecified: N17.9

## 2020-03-03 HISTORY — DX: Hypertensive urgency: I16.0

## 2020-03-03 HISTORY — DX: Chronic diastolic (congestive) heart failure: I50.32

## 2020-03-03 LAB — GLUCOSE, CAPILLARY
Glucose-Capillary: 360 mg/dL — ABNORMAL HIGH (ref 70–99)
Glucose-Capillary: 73 mg/dL (ref 70–99)
Glucose-Capillary: 150 mg/dL — ABNORMAL HIGH (ref 70–99)
Glucose-Capillary: 129 mg/dL — ABNORMAL HIGH (ref 70–99)
Glucose-Capillary: 177 mg/dL — ABNORMAL HIGH (ref 70–99)
Glucose-Capillary: 207 mg/dL — ABNORMAL HIGH (ref 70–99)

## 2020-03-03 LAB — SARS CORONAVIRUS 2 BY RT PCR (HOSPITAL ORDER, PERFORMED IN ~~LOC~~ HOSPITAL LAB): SARS Coronavirus 2: NEGATIVE

## 2020-03-03 LAB — CBC WITH DIFFERENTIAL/PLATELET
Abs Immature Granulocytes: 0.03 10*3/uL (ref 0.00–0.07)
Basophils Absolute: 0.1 10*3/uL (ref 0.0–0.1)
Basophils Relative: 1 %
Eosinophils Absolute: 0.1 10*3/uL (ref 0.0–0.5)
Eosinophils Relative: 1 %
HCT: 40.1 % (ref 36.0–46.0)
Hemoglobin: 13.3 g/dL (ref 12.0–15.0)
Immature Granulocytes: 0 %
Lymphocytes Relative: 24 %
Lymphs Abs: 2.7 10*3/uL (ref 0.7–4.0)
MCH: 27.9 pg (ref 26.0–34.0)
MCHC: 33.2 g/dL (ref 30.0–36.0)
MCV: 84.1 fL (ref 80.0–100.0)
Monocytes Absolute: 1.4 10*3/uL — ABNORMAL HIGH (ref 0.1–1.0)
Monocytes Relative: 13 %
Neutro Abs: 6.6 10*3/uL (ref 1.7–7.7)
Neutrophils Relative %: 61 %
Platelets: 279 10*3/uL (ref 150–400)
RBC: 4.77 MIL/uL (ref 3.87–5.11)
RDW: 13.2 % (ref 11.5–15.5)
WBC: 10.9 10*3/uL — ABNORMAL HIGH (ref 4.0–10.5)
nRBC: 0 % (ref 0.0–0.2)

## 2020-03-03 LAB — BASIC METABOLIC PANEL
Anion gap: 11 (ref 5–15)
BUN: 39 mg/dL — ABNORMAL HIGH (ref 8–23)
CO2: 25 mmol/L (ref 22–32)
Calcium: 8.4 mg/dL — ABNORMAL LOW (ref 8.9–10.3)
Chloride: 101 mmol/L (ref 98–111)
Creatinine, Ser: 1.36 mg/dL — ABNORMAL HIGH (ref 0.44–1.00)
GFR calc Af Amer: 43 mL/min — ABNORMAL LOW (ref >60)
GFR calc non Af Amer: 37 mL/min — ABNORMAL LOW (ref >60)
Glucose, Bld: 151 mg/dL — ABNORMAL HIGH (ref 70–99)
Potassium: 3.2 mmol/L — ABNORMAL LOW (ref 3.5–5.1)
Sodium: 137 mmol/L (ref 135–145)

## 2020-03-03 LAB — CREATININE, URINE, RANDOM: Creatinine, Urine: 341 mg/dL

## 2020-03-03 LAB — SODIUM, URINE, RANDOM: Sodium, Ur: <10 mmol/L

## 2020-03-03 LAB — LACTIC ACID, PLASMA: Lactic Acid, Venous: 2.2 mmol/L (ref 0.5–1.9)

## 2020-03-03 MED ORDER — ONDANSETRON HCL 4 MG PO TABS: 4.0000 mg | ORAL_TABLET | Freq: Four times a day (QID) | ORAL | Status: DC | PRN

## 2020-03-03 MED ORDER — ACETAMINOPHEN 650 MG RE SUPP: 650.0000 mg | Freq: Four times a day (QID) | RECTAL | Status: DC | PRN

## 2020-03-03 MED ORDER — ONDANSETRON HCL 4 MG/2ML IJ SOLN: 4.0000 mg | Freq: Four times a day (QID) | INTRAMUSCULAR | Status: DC | PRN

## 2020-03-03 MED ORDER — ARFORMOTEROL TARTRATE 15 MCG/2ML IN NEBU
15.0000 ug | INHALATION_SOLUTION | Freq: Two times a day (BID) | RESPIRATORY_TRACT | Status: DC
  Administered 2020-03-03 – 2020-03-05 (×4): 15 ug via RESPIRATORY_TRACT
  Filled 2020-03-03 (×8): qty 2

## 2020-03-03 MED ORDER — INSULIN ASPART 100 UNIT/ML ~~LOC~~ SOLN
0.0000 [IU] | SUBCUTANEOUS | Status: DC
  Administered 2020-03-03: 3 [IU] via SUBCUTANEOUS
  Administered 2020-03-03: 5 [IU] via SUBCUTANEOUS
  Administered 2020-03-03: 15 [IU] via SUBCUTANEOUS
  Administered 2020-03-04 (×3): 3 [IU] via SUBCUTANEOUS
  Administered 2020-03-05: 5 [IU] via SUBCUTANEOUS

## 2020-03-03 MED ORDER — SERTRALINE HCL 100 MG PO TABS
100.0000 mg | ORAL_TABLET | Freq: Every day | ORAL | Status: DC
  Administered 2020-03-03 – 2020-03-05 (×3): 100 mg via ORAL
  Filled 2020-03-03 (×3): qty 1

## 2020-03-03 MED ORDER — HEPARIN SODIUM (PORCINE) 5000 UNIT/ML IJ SOLN
5000.0000 [IU] | Freq: Three times a day (TID) | INTRAMUSCULAR | Status: DC
  Administered 2020-03-03 – 2020-03-05 (×7): 5000 [IU] via SUBCUTANEOUS
  Filled 2020-03-03 (×7): qty 1

## 2020-03-03 MED ORDER — CLONIDINE HCL 0.2 MG PO TABS
0.2000 mg | ORAL_TABLET | Freq: Two times a day (BID) | ORAL | Status: DC
  Administered 2020-03-03 – 2020-03-05 (×6): 0.2 mg via ORAL
  Filled 2020-03-03 (×6): qty 1

## 2020-03-03 MED ORDER — UMECLIDINIUM BROMIDE 62.5 MCG/INH IN AEPB
1.0000 | INHALATION_SPRAY | Freq: Every day | RESPIRATORY_TRACT | Status: DC
  Administered 2020-03-04 – 2020-03-05 (×2): 1 via RESPIRATORY_TRACT
  Filled 2020-03-03: qty 7

## 2020-03-03 MED ORDER — SODIUM CHLORIDE 0.9 % IV BOLUS
500.0000 mL | Freq: Once | INTRAVENOUS | Status: AC
  Administered 2020-03-03: 500 mL via INTRAVENOUS

## 2020-03-03 MED ORDER — HYDRALAZINE HCL 50 MG PO TABS
50.0000 mg | ORAL_TABLET | Freq: Four times a day (QID) | ORAL | Status: DC | PRN
  Administered 2020-03-03: 50 mg via ORAL
  Filled 2020-03-03: qty 2

## 2020-03-03 MED ORDER — METOCLOPRAMIDE HCL 5 MG/ML IJ SOLN: 5.0000 mg | Freq: Three times a day (TID) | INTRAMUSCULAR | Status: DC | PRN

## 2020-03-03 MED ORDER — SODIUM CHLORIDE 0.9 % IV SOLN
1.0000 g | Freq: Once | INTRAVENOUS | Status: AC
  Administered 2020-03-03: 1 g via INTRAVENOUS
  Filled 2020-03-03: qty 10

## 2020-03-03 MED ORDER — ALBUTEROL SULFATE (2.5 MG/3ML) 0.083% IN NEBU: 2.5000 mg | INHALATION_SOLUTION | RESPIRATORY_TRACT | Status: DC | PRN

## 2020-03-03 MED ORDER — ONDANSETRON HCL 4 MG/2ML IJ SOLN
4.0000 mg | Freq: Once | INTRAMUSCULAR | Status: AC
  Administered 2020-03-03: 4 mg via INTRAVENOUS
  Filled 2020-03-03: qty 2

## 2020-03-03 MED ORDER — PRAVASTATIN SODIUM 40 MG PO TABS
40.0000 mg | ORAL_TABLET | Freq: Every day | ORAL | Status: DC
  Administered 2020-03-03 – 2020-03-05 (×3): 40 mg via ORAL
  Filled 2020-03-03 (×3): qty 1

## 2020-03-03 MED ORDER — BUDESON-GLYCOPYRROL-FORMOTEROL 160-9-4.8 MCG/ACT IN AERO: 2.0000 | INHALATION_SPRAY | Freq: Two times a day (BID) | RESPIRATORY_TRACT | Status: DC

## 2020-03-03 MED ORDER — FENTANYL CITRATE (PF) 100 MCG/2ML IJ SOLN: 25.0000 ug | INTRAMUSCULAR | Status: DC | PRN

## 2020-03-03 MED ORDER — BUDESONIDE 0.25 MG/2ML IN SUSP
0.2500 mg | Freq: Two times a day (BID) | RESPIRATORY_TRACT | Status: DC
  Administered 2020-03-03 – 2020-03-05 (×4): 0.25 mg via RESPIRATORY_TRACT
  Filled 2020-03-03 (×4): qty 2

## 2020-03-03 MED ORDER — AMLODIPINE BESYLATE 10 MG PO TABS
10.0000 mg | ORAL_TABLET | Freq: Every day | ORAL | Status: DC
  Administered 2020-03-03 – 2020-03-05 (×3): 10 mg via ORAL
  Filled 2020-03-03 (×3): qty 1
  Filled 2020-03-03: qty 2

## 2020-03-03 MED ORDER — INSULIN GLARGINE 100 UNIT/ML ~~LOC~~ SOLN
15.0000 [IU] | Freq: Every day | SUBCUTANEOUS | Status: DC
  Administered 2020-03-03 – 2020-03-04 (×2): 15 [IU] via SUBCUTANEOUS
  Filled 2020-03-03 (×3): qty 0.15

## 2020-03-03 MED ORDER — SODIUM CHLORIDE 0.9 % IV SOLN
1.0000 g | INTRAVENOUS | Status: DC
  Administered 2020-03-04 – 2020-03-05 (×2): 1 g via INTRAVENOUS
  Filled 2020-03-03: qty 1
  Filled 2020-03-03 (×2): qty 10

## 2020-03-03 MED ORDER — FAMOTIDINE IN NACL 20-0.9 MG/50ML-% IV SOLN
20.0000 mg | INTRAVENOUS | Status: AC
  Administered 2020-03-03: 20 mg via INTRAVENOUS
  Filled 2020-03-03: qty 50

## 2020-03-03 MED ORDER — SODIUM CHLORIDE 0.9 % IV BOLUS
1000.0000 mL | Freq: Once | INTRAVENOUS | Status: AC
  Administered 2020-03-03: 1000 mL via INTRAVENOUS

## 2020-03-03 MED ORDER — SODIUM CHLORIDE 0.9 % IV SOLN: INTRAVENOUS | Status: AC

## 2020-03-03 MED ORDER — ACETAMINOPHEN 325 MG PO TABS: 650.0000 mg | ORAL_TABLET | Freq: Four times a day (QID) | ORAL | Status: DC | PRN

## 2020-03-03 NOTE — Progress Notes (Signed)
   Patient was seen and admitted by Dr. Myna Hidalgo earlier this morning please see his note for detailed H&P.   79 year old lady with prior history of insulin-dependent DM, hypertension, COPD, paroxysmal atrial fibrillation presents to ED with nausea vomiting.  Patient was recently started on Ozempic following which patient had persistent nausea and vomiting.  She discontinued Ozempic and was referred to ED for persistent symptoms on arrival to ED she was found to be in mild AKI and elevated lactic acid.  Urine analysis showed leukocytosis. She was admitted by [facility] for evaluation of UTI, AKI and dehydration.  Plan continue with IV fluids, continue to hold Ozempic.  Start the patient on 15 units of Lantus daily and resume Rocephin.   Shakenna Herrero,MD

## 2020-03-03 NOTE — ED Notes (Signed)
Attempted report x2. Floor RN unable to take report due to pt systolic BP greater than 525. Floor RN made aware that pt just received dual BP medications.

## 2020-03-03 NOTE — H&P (Signed)
History and Physical    Christina Bishop:[id number] DOB: [birth year] DOA: 03/02/2020  PCP: Christina Anes, MD   Patient coming from: Home   Chief Complaint: N/V   HPI: Christina Bishop is a 79 y.o. female with medical history significant for insulin-dependent diabetes mellitus, hypertension, COPD, and paroxysmal atrial fibrillation not anticoagulated due to patient refusal, now presenting to the emergency department for evaluation of nausea and nonbloody vomiting.  Patient had a hemoglobin A1c of 10% recently, was started on Ozempic on 02/27/2020 and then developed nausea with nonbloody vomiting.  She has gone on to have persistent nausea, nonbloody vomiting, and has had great difficulty trying to take her medications or hold down any liquids at home.  She saw her PCP for this on 03/01/2020, Ozempic was discontinued, she was treated with IM Phenergan and given a prescription for Zofran, but unfortunately has not had any improvement with this.  She denies abdominal pain, reports having a normal bowel movement yesterday, denies dysuria or flank pain, and denies any headache, change in vision or hearing, focal numbness or weakness, or chest pain.  She feels that her chronic exertional dyspnea has been worse in the last couple weeks but she has not been coughing much.  ED Course: Upon arrival to the ED, patient is found to be afebrile, saturating mid 90s on room air, normal respiratory and heart rate, and SBP ranging from 113-227.  EKG features sinus rhythm with LAFB and LVH with repolarization abnormality.  Chemistry panel is notable for glucose of 305 and creatinine 1.70, up from 0.9 last month.  CBC features a leukocytosis to 14,000, mild thrombocytosis, and mild polycythemia.  Lactic acid is elevated to 2.2.  COVID-19 PCR is negative.  Urinalysis notable for glucosuria, ketonuria, and proteinuria.  She was treated with 500 cc of saline, Zofran, Pepcid, clonidine, Norvasc, and Rocephin in the  ED.  Review of Systems:  All other systems reviewed and apart from HPI, are negative.  Past Medical History:  Diagnosis Date  . Depression   . Glaucoma   . H/O: hysterectomy   . HTN (hypertension)   . Hypercholesterolemia   . Psoriatic arthritis ([place])   . SOB (shortness of breath) on exertion     Past Surgical History:  Procedure Laterality Date  . ABDOMINAL HYSTERECTOMY    . ABDOMINAL SURGERY    . APPENDECTOMY    . CARDIAC CATHETERIZATION    . JOINT REPLACEMENT     Both Knees  . TOTAL KNEE ARTHROPLASTY    . TUBAL LIGATION      Social History:   reports that she has never smoked. She has never used smokeless tobacco. She reports that she does not drink alcohol and does not use drugs.  Allergies  Allergen Reactions  . Ace Inhibitors     dyspnea  . Codeine Itching  . Metoprolol Other (See Comments)    Hallucinations    Family History  Problem Relation Age of Onset  . Stroke Mother   . Heart attack Father   . Heart disease Brother   . Leukemia Paternal Uncle   . Diabetes Cousin   . Leukemia Cousin      Prior to Admission medications   Medication Sig Start Date End Date Taking? Authorizing Provider  acetaminophen (TYLENOL) 325 MG tablet Take 2 tablets (650 mg total) by mouth every 4 (four) hours as needed for headache or mild pain. 03/27/14  Yes [place]roy, Luke K, PA-C  amLODipine (NORVASC) 10 MG tablet  Take 1 tablet (10 mg total) by mouth daily. 01/28/17  Yes [place], Peter M, MD  Budeson-Glycopyrrol-Formoterol (BREZTRI AEROSPHERE) 160-9-4.8 MCG/ACT AERO Inhale 2 puffs into the lungs in the morning and at bedtime. 02/23/20  Yes Christina Anes, MD  cloNIDine (CATAPRES) 0.2 MG tablet Take 1 tablet (0.2 mg total) by mouth 2 (two) times daily. 01/28/17  Yes [place], Peter M, MD  esomeprazole (NEXIUM) 40 MG capsule Take 40 mg by mouth daily.  12/30/19  Yes [provider]  furosemide (LASIX) 20 MG tablet Take 1 tablet (20 mg total) by mouth daily. 01/28/17  Yes  [place], Peter M, MD  glimepiride (AMARYL) 2 MG tablet Take 2 mg by mouth daily with breakfast.   Yes [provider]  insulin degludec (TRESIBA FLEXTOUCH) 100 UNIT/ML SOPN FlexTouch Pen Inject 22 Units into the skin daily.  11/03/18  Yes [provider]  pravastatin (PRAVACHOL) 40 MG tablet Take 40 mg by mouth daily.  12/03/19  Yes [provider]  predniSONE (DELTASONE) 5 MG tablet Take 1 tablet by mouth daily as needed (pain).  03/04/19  Yes [provider]  Semaglutide,0.25 or 0.5MG /DOS, (OZEMPIC, 0.25 OR 0.5 MG/DOSE,) 2 MG/1.5ML SOPN Inject 0.5 mg into the skin once a week.    Yes [provider]  sertraline (ZOLOFT) 100 MG tablet Take 100 mg by mouth daily.  02/24/12  Yes [provider]  [place] test strip  12/30/19   [provider]  Accu-Chek Softclix Lancets lancets  12/30/19   [provider]  Alcohol Swabs (B-D SINGLE USE SWABS REGULAR) PADS  12/30/19   [provider]  Blood Glucose Calibration (ACCU-CHEK AVIVA) SOLN  12/30/19   [provider]  brimonidine (ALPHAGAN) 0.2 % ophthalmic solution  12/30/19   [provider]  dorzolamide-timolol (COSOPT) 22.3-6.8 MG/ML ophthalmic solution  12/30/19   [provider]  Insulin Pen Needle (NOVOFINE PLUS) 32G X 4 MM MISC by Does not apply route.    [provider]  latanoprost (XALATAN) 0.005 % ophthalmic solution  12/30/19   [provider]  levofloxacin (LEVAQUIN) 500 MG tablet Take 1 tablet (500 mg total) by mouth daily. 02/23/20   Christina Anes, MD  ondansetron (ZOFRAN) 8 MG tablet Take 1 tablet (8 mg total) by mouth every 8 (eight) hours as needed for nausea or vomiting. 03/01/20   Christina Anes, MD    Physical Exam: Vitals:   03/03/20 0400 03/03/20 0430 03/03/20 0447 03/03/20 0522  BP: (!) 184/123 (!) 202/179 (!) 202/178 (!) 217/129  Pulse: 98 99    Resp: (!) 25 20    Temp:      TempSrc:      SpO2:  92% 94%    Weight:      Height:        Constitutional: NAD, calm  Eyes: PERTLA, lids and conjunctivae normal ENMT: Mucous membranes are moist. Posterior pharynx clear of any exudate or lesions.   Neck: normal, supple, no masses, no thyromegaly Respiratory: no wheezing, no crackles. No accessory muscle use.  Cardiovascular: S1 & S2 heard, regular rate and rhythm. No extremity edema.  Abdomen: No distension, no tenderness, soft. Bowel sounds active.  Musculoskeletal: no clubbing / cyanosis. No joint deformity upper and lower extremities.   Skin: no significant rashes, lesions, ulcers. Warm, dry, well-perfused. Neurologic: CN 2-12 grossly intact. Sensation intact. Moving all extremities.  Psychiatric: Alert and oriented to person, place, and situation. Calm and cooperative.  Labs and Imaging on Admission: I have personally reviewed following labs and imaging studies  CBC: Recent Labs  Lab 03/02/20 1330  WBC 14.0*  HGB 17.0*  HCT 51.1*  MCV 83.0  PLT 194*   Basic Metabolic Panel: Recent Labs  Lab 03/02/20 1330  NA 133*  K 3.5  CL 91*  CO2 23  GLUCOSE 305*  BUN 45*  CREATININE 1.70*  CALCIUM 10.0   GFR: Estimated Creatinine Clearance: 24.8 mL/min (A) (by C-G formula based on SCr of 1.7 mg/dL (H)). Liver Function Tests: Recent Labs  Lab 03/02/20 1330  AST 24  ALT 17  ALKPHOS 76  BILITOT 1.1  PROT 7.8  ALBUMIN 4.6   Recent Labs  Lab 03/02/20 1330  LIPASE 31   No results for input(s): AMMONIA in the last 168 hours. Coagulation Profile: No results for input(s): INR, PROTIME in the last 168 hours. Cardiac Enzymes: No results for input(s): CKTOTAL, CKMB, CKMBINDEX, TROPONINI in the last 168 hours. BNP (last 3 results) No results for input(s): PROBNP in the last 8760 hours. HbA1C: No results for input(s): HGBA1C in the last 72 hours. CBG: No results for input(s): GLUCAP in the last 168 hours. Lipid Profile: No results for input(s): CHOL, HDL, LDLCALC,  TRIG, CHOLHDL, LDLDIRECT in the last 72 hours. Thyroid Function Tests: No results for input(s): TSH, T4TOTAL, FREET4, T3FREE, THYROIDAB in the last 72 hours. Anemia Panel: No results for input(s): VITAMINB12, FOLATE, FERRITIN, TIBC, IRON, RETICCTPCT in the last 72 hours. Urine analysis:    Component Value Date/Time   COLORURINE AMBER (A) 03/02/2020 2135   APPEARANCEUR CLOUDY (A) 03/02/2020 2135   LABSPEC 1.025 03/02/2020 2135   PHURINE 5.0 03/02/2020 2135   GLUCOSEU 50 (A) 03/02/2020 2135   HGBUR NEGATIVE 03/02/2020 2135   BILIRUBINUR NEGATIVE 03/02/2020 2135   KETONESUR 5 (A) 03/02/2020 2135   PROTEINUR >=300 (A) 03/02/2020 2135   UROBILINOGEN 0.2 03/26/2014 1627   NITRITE NEGATIVE 03/02/2020 2135   LEUKOCYTESUR LARGE (A) 03/02/2020 2135   Sepsis Labs: @LABRCNTIP (procalcitonin:4,lacticidven:4) ) Recent Results (from the past 240 hour(s))  SARS Coronavirus 2 by RT PCR (hospital order, performed in [place] hospital lab) Nasopharyngeal Nasopharyngeal Swab     Status: None   Collection Time: 03/03/20  2:54 AM   Specimen: Nasopharyngeal Swab  Result Value Ref Range Status   SARS Coronavirus 2 NEGATIVE NEGATIVE Final    Comment: (NOTE) SARS-CoV-2 target nucleic acids are NOT DETECTED.  The SARS-CoV-2 RNA is generally detectable in upper and lower respiratory specimens during the acute phase of infection. The lowest concentration of SARS-CoV-2 viral copies this assay can detect is 250 copies / mL. A negative result does not preclude SARS-CoV-2 infection and should not be used as the sole basis for treatment or other patient management decisions.  A negative result may occur with improper specimen collection / handling, submission of specimen other than nasopharyngeal swab, presence of viral mutation(s) within the areas targeted by this assay, and inadequate number of viral copies (<250 copies / mL). A negative result must be combined with clinical observations, patient  history, and epidemiological information.  Fact Sheet for Patients:   StrictlyIdeas.no  Fact Sheet for Healthcare Providers: BankingDealers.co.za  This test is not yet approved or  cleared by the [place] FDA and has been authorized for detection and/or diagnosis of SARS-CoV-2 by FDA under an Emergency Use Authorization (EUA).  This EUA will remain in effect (meaning this test can be used) for the duration of the COVID-19  declaration under Section 564(b)(1) of the Act, 21 U.S.C. section 360bbb-3(b)(1), unless the authorization is terminated or revoked sooner.  Performed at [facility], [place] [address], [place], [place] [address]      Radiological Exams on Admission: No results found.  EKG: Independently reviewed. Sinus rhythm, LVH, repolarization abnormality.   Assessment/Plan   1. Acute kidney injury  - Presents with several days of N/V and is found to have SCr of 1.70, up from 0.90 on 02/18/20 - Likely acute prerenal azotemia in setting of hypovolemia after several days of N/V  - She was given 500 cc NS in ED  - Continue IVF hydration, renally-dose medications, avoid nephrotoxins, repeat chem panel   2. Intractable N/V  - Presents with N/V since 9/4 after starting Ozempic and has had trouble taking her medications or holding anything down since despite IM Phenergan on 9/7 and PO Zofran  - She had a normal BM yesterday, has benign abdominal exam, no fevers/chills, and this is likely secondary to Ozempic which has half-life of 7 days  - Continue IVF hydration and IV antiemetics, monitor electrolytes, advance diet as she improves    3. Hypertensive urgency  - There have been some SBP readings in 200s in ED  - She is asymptomatic with this and there are also normal BPs documented in ED, so hesitant to treat too aggressively  - She had been unable to take her medications last few days due to N/V but tolerating now after IV  antiemetics  - Continue Norvasc and clonidine, use oral hydralazine if needed    4. Insulin-dependent DM  - A1c was 10.0% two weeks ago  - Check CBGs, use insulin only for now    5. Paroxysmal atrial fibrillation  - In sinus rhythm on admission  - CHADS-VASc 6 (age x2, gender, CHF, DM, HTN)  - Patient aware of stroke risk, has discussed with cardiology, but does not want to be anticoagulated   6. Chronic diastolic CHF  - Hypovolemic after several days of N/V  - Continue cautious IVF hydration, hold Lasix, monitor weight and I/Os    7. COPD  - Patient has felt more dyspneic recently but denies any change in chronic cough and not wheezing  - Continue ICS/LAMA/LABA and as-needed albuterol    DVT prophylaxis: sq heparin  Code Status: Full  Family Communication: Discussed with patient  Disposition Plan:  Patient is from: Home  Anticipated d/c is to: TBD Anticipated d/c date is: Possibly as early as 03/04/20 Patient currently: Has acute kidney injury, requiring IV antiemetics for N/V  Consults called: None  Admission status: Observation     Vianne Bulls, MD [facility]  03/03/2020, 5:31 AM

## 2020-03-03 NOTE — ED Provider Notes (Signed)
[facility] EMERGENCY DEPARTMENT Provider Note   CSN: [id number] Arrival date & time: 03/02/20  1311     History Chief Complaint  Patient presents with  . Nausea  . Emesis    Christina Bishop is a 79 y.o. female.   79 year old female with a history of hypertension, hyperlipidemia presents to the emergency department for evaluation of nausea and vomiting.  Her symptoms have been present over the past 4 days.  They began 12 hours after starting Ozempic for diabetes management.  She has had too numerous to count episodes of vomiting daily.  Reports that symptoms have arguably improved today as she has only vomited 6 times, but she has been unable to tolerate food since her vomiting began.  Saw her primary care doctor on 03/01/2020 who treated her with a shot of Phenergan without symptomatic relief.  She was given a prescription for Zofran, but was unable to pick up this medication from the pharmacy.  Reports some epigastric discomfort around the time of her vomiting episodes, but no pain currently at rest. Further denies fever, hematemesis, diarrhea, melena, hematochezia, dysuria, hematuria, back pain.  Abdominal surgical history significant for cesarean section x2, appendectomy, partial hysterectomy x2, tubal ligation.   Emesis      Past Medical History:  Diagnosis Date  . Depression   . Glaucoma   . H/O: hysterectomy   . HTN (hypertension)   . Hypercholesterolemia   . Psoriatic arthritis ([place])   . SOB (shortness of breath) on exertion     Patient Active Problem List   Diagnosis Date Noted  . AKI (acute kidney injury) ([place]) 03/03/2020  . Vomiting 03/01/2020  . Acute bronchitis with COPD ([place]) 02/23/2020  . Obesity, diabetes, and hypertension syndrome ([place]) 02/18/2020  . Morbid obesity ([place]) 02/18/2020  . Heartburn 01/13/2019  . 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 Orthostatic hypotension 02/23/2015  . Chest pain 02/14/2015   . Syncope 03/27/2014  . Atrial fibrillation ([place]) 03/26/2014  . GERD (gastroesophageal reflux disease) 03/27/2013  . HTN (hypertension)   . Hypercholesterolemia   . Dyspnea on exertion   . Depression 02/01/2012  . Glaucoma 02/01/2012  . High risk medication use 02/01/2012  . History of cardiac catheterization 02/01/2012  . Psoriatic arthritis ([place]) 02/01/2012    Past Surgical History:  Procedure Laterality Date  . ABDOMINAL HYSTERECTOMY    . ABDOMINAL SURGERY    . APPENDECTOMY    . CARDIAC CATHETERIZATION    . JOINT REPLACEMENT     Both Knees  . TOTAL KNEE ARTHROPLASTY    . TUBAL LIGATION       OB History   No obstetric history on file.     Family History  Problem Relation Age of Onset  . Stroke Mother   . Heart attack Father   . Heart disease Brother   . Leukemia Paternal Uncle   . Diabetes Cousin   . Leukemia Cousin     Social History   Tobacco Use  . Smoking status: Never Smoker  . Smokeless tobacco: Never Used  Substance Use Topics  . Alcohol use: No  . Drug use: No    Home Medications Prior to Admission medications   Medication Sig Start Date End Date Taking? Authorizing Provider  ACCU-CHEK AVIVA PLUS test strip  12/30/19   [provider]  Accu-Chek Softclix Lancets lancets  12/30/19   [provider]  acetaminophen (TYLENOL) 325 MG tablet Take 2 tablets (650 mg  total) by mouth every 4 (four) hours as needed for headache or mild pain. 03/27/14   Erlene Quan, PA-C  Alcohol Swabs (B-D SINGLE USE SWABS REGULAR) PADS  12/30/19   [provider]  amLODipine (NORVASC) 10 MG tablet Take 1 tablet (10 mg total) by mouth daily. 01/28/17   [place], Peter M, MD  Blood Glucose Calibration (ACCU-CHEK AVIVA) SOLN  12/30/19   [provider]  brimonidine (ALPHAGAN) 0.2 % ophthalmic solution  12/30/19   [provider]  Budeson-Glycopyrrol-Formoterol (BREZTRI AEROSPHERE) 160-9-4.8 MCG/ACT AERO Inhale 2 puffs into the lungs in the  morning and at bedtime. 02/23/20   Lillard Anes, MD  cloNIDine (CATAPRES) 0.2 MG tablet Take 1 tablet (0.2 mg total) by mouth 2 (two) times daily. 01/28/17   [place], Peter M, MD  dorzolamide-timolol (COSOPT) 22.3-6.8 MG/ML ophthalmic solution  12/30/19   [provider]  esomeprazole ([place]) 40 MG capsule  12/30/19   [provider]  furosemide (LASIX) 20 MG tablet Take 1 tablet (20 mg total) by mouth daily. 01/28/17   [place], Peter M, MD  insulin degludec (TRESIBA FLEXTOUCH) 100 UNIT/ML SOPN FlexTouch Pen Inject 24 Units into the skin daily.  11/03/18   [provider]  Insulin Pen Needle (NOVOFINE PLUS) 32G X 4 MM MISC by Does not apply route.    [provider]  latanoprost (XALATAN) 0.005 % ophthalmic solution  12/30/19   [provider]  levofloxacin (LEVAQUIN) 500 MG tablet Take 1 tablet (500 mg total) by mouth daily. 02/23/20   Lillard Anes, MD  ondansetron (ZOFRAN) 8 MG tablet Take 1 tablet (8 mg total) by mouth every 8 (eight) hours as needed for nausea or vomiting. 03/01/20   Lillard Anes, MD  pravastatin (PRAVACHOL) 40 MG tablet  12/03/19   [provider]  predniSONE (DELTASONE) 5 MG tablet Take 1 tablet by mouth daily as needed. 03/04/19   [provider]  sertraline (ZOLOFT) 100 MG tablet Take 100 mg by mouth daily.  02/24/12   [provider]    Allergies    Ace inhibitors, Codeine, and Metoprolol  Review of Systems   Review of Systems  Gastrointestinal: Positive for vomiting.  Ten systems reviewed and are negative for acute change, except as noted in the HPI.    Physical Exam Updated Vital Signs BP (!) 227/85   Pulse 90   Temp 98.2 F (36.8 C) (Oral)   Resp (!) 22   Ht 4\' 9"  (1.448 m)   Wt 83.9 kg   SpO2 93%   BMI 40.03 kg/m   Physical Exam Vitals and nursing note reviewed.  Constitutional:      General: She is not in acute distress.    Appearance: She is well-developed. She is  not diaphoretic.     Comments: Obese female, in no distress  HENT:     Head: Normocephalic and atraumatic.  Eyes:     General: No scleral icterus.    Conjunctiva/sclera: Conjunctivae normal.  Cardiovascular:     Rate and Rhythm: Regular rhythm. Tachycardia present.     Pulses: Normal pulses.     Comments: Intermittent tachycardia.  Appears to be sinus on the monitor. Pulmonary:     Effort: Pulmonary effort is normal. No respiratory distress.  Abdominal:     Palpations: Abdomen is soft. There is no mass.     Tenderness: There is no guarding.     Comments: Abdomen soft without focal tenderness.  No guarding, rebound,  peritoneal signs.  Musculoskeletal:        General: Normal range of motion.     Cervical back: Normal range of motion.  Skin:    General: Skin is warm and dry.     Coloration: Skin is not pale.     Findings: No erythema or rash.  Neurological:     Mental Status: She is alert and oriented to person, place, and time.  Psychiatric:        Behavior: Behavior normal.     ED Results / Procedures / Treatments   Labs (all labs ordered are listed, but only abnormal results are displayed) Labs Reviewed  COMPREHENSIVE METABOLIC PANEL - Abnormal; Notable for the following components:      Result Value   Sodium 133 (*)    Chloride 91 (*)    Glucose, Bld 305 (*)    BUN 45 (*)    Creatinine, Ser 1.70 (*)    GFR calc non Af Amer 29 (*)    GFR calc Af Amer 33 (*)    Anion gap 19 (*)    All other components within normal limits  CBC - Abnormal; Notable for the following components:   WBC 14.0 (*)    RBC 6.16 (*)    Hemoglobin 17.0 (*)    HCT 51.1 (*)    Platelets 473 (*)    All other components within normal limits  URINALYSIS, ROUTINE W REFLEX MICROSCOPIC - Abnormal; Notable for the following components:   Color, Urine AMBER (*)    APPearance CLOUDY (*)    Glucose, UA 50 (*)    Ketones, ur 5 (*)    Protein, ur >=300 (*)    Leukocytes,Ua LARGE (*)    WBC, UA >50  (*)    Bacteria, UA RARE (*)    All other components within normal limits  LACTIC ACID, PLASMA - Abnormal; Notable for the following components:   Lactic Acid, Venous 2.2 (*)    All other components within normal limits  SARS CORONAVIRUS 2 BY RT PCR (HOSPITAL ORDER, [facility])  URINE CULTURE  LIPASE, BLOOD    EKG None  Radiology No results found.  Procedures Procedures (including critical care time)  Medications Ordered in ED Medications  famotidine (PEPCID) IVPB 20 mg premix (has no administration in time range)  cloNIDine (CATAPRES) tablet 0.2 mg (has no administration in time range)  amLODipine (NORVASC) tablet 10 mg (has no administration in time range)  sodium chloride 0.9 % bolus 500 mL (has no administration in time range)  sodium chloride 0.9 % bolus 1,000 mL (1,000 mLs Intravenous New Bag/Given 03/03/20 0309)  cefTRIAXone (ROCEPHIN) 1 g in sodium chloride 0.9 % 100 mL IVPB (1 g Intravenous New Bag/Given 03/03/20 0309)  ondansetron (ZOFRAN) injection 4 mg (4 mg Intravenous Given 03/03/20 0302)    ED Course  I have reviewed the triage vital signs and the nursing notes.  Pertinent labs & imaging results that were available during my care of the patient were reviewed by me and considered in my medical decision making (see chart for details).  Clinical Course as of Mar 04 411  Thu Mar 03, 1541  2541 79 year old female presenting for onset of vomiting after starting Ozempic for diabetes management.  Has had too numerous to count episodes of emesis over the past 4 days.  Abdominal exam is benign and she denies constant abdominal pain.  She does report some discomfort in her epigastrium around the time  of her emesis episodes.  Her evaluation was initiated in triage.  Work-up notable for acute kidney injury, likely secondary to dehydration.  She does have an elevated glucose of 305 with an increased anion gap, though this is not favored to be secondary  to DKA.  Urinalysis also notable for large leukocytes concerning for urinary tract infection.  UTI versus stress of persistent emesis and dehydration/hemoconcentration may be contributing to the patient's leukocytosis.  She has an intermittent tachycardia meeting SIRS criteria, though she is not felt to be septic at this time.  Will start on IV Rocephin, hydrate with IV fluids.  Orders also placed for IV Pepcid and IV antiemetics.   [KH]  8032 BP rechecked at this time. Noted to be 187/79. Patient has not taken any of her daily medications 2/2 emesis which is likely contributing to BP. Take Norvasc QD and Clonidine BID. Will order.  Nausea has improved with Zofran. Plan admission to hospitalist service for management of acute dehydration/AKI and UTI with SIRS criteria.   [KH]    Clinical Course User Index [KH] Beverely Pace    MDM Rules/Calculators/A&P                          79 year old female presents to the emergency department for evaluation of intractable vomiting which began after starting Ozempic 4 days ago.  Was seen by her primary care doctor in the office on 03/01/2020 and given a prescription for Zofran as well as a shot of IM Phenergan which did not improve her symptoms.  Denies abdominal pain.  Abdominal exam is benign and stable on repeat assessment.  Is doing a bit better since receiving IV Zofran as well as IV fluids, but was found to have an AKI.  GFR cut in half from baseline.  Also with pyuria c/w urinary tract infection.  Urine culture ordered.  She was started on IV Rocephin.  Patient meets SIRS criteria, but leukocytosis favored to be due to stress of vomiting and dehydration/hemoconcentration.  Patient not felt to be septic at time of admission.  Will admit to [facility] for further management, rehydration. Case discussed with Dr. Myna Hidalgo who will admit.   Final Clinical Impression(s) / ED Diagnoses Final diagnoses:  AKI (acute kidney injury) ([place])  Pyuria  SIRS (systemic  inflammatory response syndrome) [facility])    Rx / DC Orders ED Discharge Orders    None       Antonietta Breach, PA-C 03/03/20 0419    Merryl Hacker, MD 03/03/20 [phone]

## 2020-03-03 NOTE — Progress Notes (Signed)
Inpatient Diabetes Program Recommendations  AACE/ADA: New Consensus Statement on Inpatient Glycemic Control (2015)  Target Ranges:  Prepandial:   less than 140 mg/dL      Peak postprandial:   less than 180 mg/dL (1-2 hours)      Critically ill patients:  140 - 180 mg/dL   Lab Results  Component Value Date   GLUCAP 360 (H) 03/03/2020   HGBA1C 10.0 (H) 02/18/2020    Review of Glycemic Control Results for RAYN, SHORB (MRN [id number]) as of 03/03/2020 14:12  Ref. Range 03/03/2020 06:55  Glucose-Capillary Latest Ref Range: 70 - 99 mg/dL 360 (H)   Diabetes history: DM 2 Outpatient Diabetes medications:  Amaryl 2 mg daily, Tresiba 22 units daily, Ozempic started on 02/27/20 and patient had Nausea and vomitting- d/c'd on 03/01/20 Current orders for Inpatient glycemic control:  Novolog moderate q 4 hours Inpatient Diabetes Program Recommendations:    Please restart Lantus 15 units daily (patient takes [place] 22 units at home).    Thank s Adah Perl, RN, BC-ADM Inpatient Diabetes Coordinator Pager [phone] (8a-5p)

## 2020-03-03 NOTE — Progress Notes (Signed)
Called to receive report, informed nurse left her phone at the desk.

## 2020-03-03 NOTE — TOC Progression Note (Signed)
Transition of Care [facility]) - Progression Note    Patient Details  Name: Christina Bishop MRN: [id number] Date of Birth: June 21, 1941  Transition of [facility]) CM/SW Contact  Davius Goudeau, Edson Snowball, RN Phone Number: 03/03/2020, 4:47 PM  Clinical Narrative:     Patient from home by herself. Has a friend close by.   Confirmed face sheet information. Patient has transportation home at discharge.   Patient has cane at home, no other DME. Patient requesting shower chair, same ordered with [place].  Will continue to follow for discharge needs.  Expected Discharge Plan: Home/Self Care Barriers to Discharge: Continued Medical Work up  Expected Discharge Plan and Services Expected Discharge Plan: Home/Self Care   Discharge Planning Services: CM Consult Post Acute Care Choice: Durable Medical Equipment Living arrangements for the past 2 months: Apartment                 DME Arranged: Shower stool DME Agency: [employer] Date DME Agency Contacted: 03/03/20 Time DME Agency Contacted: [phone] Representative spoke with at DME Agency: Freda Munro [facility] Arranged: NA           Social Determinants of Health ([place]) Interventions    Readmission Risk Interventions No flowsheet data found.

## 2020-03-04 DIAGNOSIS — I5032 Chronic diastolic (congestive) heart failure: Secondary | ICD-10-CM | POA: Diagnosis not present

## 2020-03-04 DIAGNOSIS — M6281 Muscle weakness (generalized): Secondary | ICD-10-CM | POA: Diagnosis not present

## 2020-03-04 DIAGNOSIS — J42 Unspecified chronic bronchitis: Secondary | ICD-10-CM | POA: Diagnosis not present

## 2020-03-04 DIAGNOSIS — E119 Type 2 diabetes mellitus without complications: Secondary | ICD-10-CM | POA: Diagnosis not present

## 2020-03-04 DIAGNOSIS — I480 Paroxysmal atrial fibrillation: Secondary | ICD-10-CM | POA: Diagnosis not present

## 2020-03-04 DIAGNOSIS — N179 Acute kidney failure, unspecified: Secondary | ICD-10-CM | POA: Diagnosis not present

## 2020-03-04 DIAGNOSIS — Z794 Long term (current) use of insulin: Secondary | ICD-10-CM | POA: Diagnosis not present

## 2020-03-04 LAB — BASIC METABOLIC PANEL
Anion gap: 8 (ref 5–15)
BUN: 31 mg/dL — ABNORMAL HIGH (ref 8–23)
CO2: 26 mmol/L (ref 22–32)
Calcium: 8.5 mg/dL — ABNORMAL LOW (ref 8.9–10.3)
Chloride: 105 mmol/L (ref 98–111)
Creatinine, Ser: 1.15 mg/dL — ABNORMAL HIGH (ref 0.44–1.00)
GFR calc Af Amer: 53 mL/min — ABNORMAL LOW (ref >60)
GFR calc non Af Amer: 46 mL/min — ABNORMAL LOW (ref >60)
Glucose, Bld: 103 mg/dL — ABNORMAL HIGH (ref 70–99)
Potassium: 3.2 mmol/L — ABNORMAL LOW (ref 3.5–5.1)
Sodium: 139 mmol/L (ref 135–145)

## 2020-03-04 LAB — GLUCOSE, CAPILLARY
Glucose-Capillary: 57 mg/dL — ABNORMAL LOW (ref 70–99)
Glucose-Capillary: 78 mg/dL (ref 70–99)
Glucose-Capillary: 154 mg/dL — ABNORMAL HIGH (ref 70–99)
Glucose-Capillary: 96 mg/dL (ref 70–99)
Glucose-Capillary: 177 mg/dL — ABNORMAL HIGH (ref 70–99)
Glucose-Capillary: 172 mg/dL — ABNORMAL HIGH (ref 70–99)

## 2020-03-04 LAB — CBC
HCT: 40.8 % (ref 36.0–46.0)
Hemoglobin: 13.2 g/dL (ref 12.0–15.0)
MCH: 27.8 pg (ref 26.0–34.0)
MCHC: 32.4 g/dL (ref 30.0–36.0)
MCV: 85.9 fL (ref 80.0–100.0)
Platelets: 270 10*3/uL (ref 150–400)
RBC: 4.75 MIL/uL (ref 3.87–5.11)
RDW: 13.2 % (ref 11.5–15.5)
WBC: 7.1 10*3/uL (ref 4.0–10.5)
nRBC: 0 % (ref 0.0–0.2)

## 2020-03-04 LAB — UREA NITROGEN, URINE: Urea Nitrogen, Ur: 984 mg/dL

## 2020-03-04 MED ORDER — SODIUM CHLORIDE 0.9 % IV SOLN: INTRAVENOUS | Status: DC

## 2020-03-04 MED ORDER — NYSTATIN 100000 UNIT/GM EX POWD
Freq: Three times a day (TID) | CUTANEOUS | Status: DC
  Filled 2020-03-04: qty 15

## 2020-03-04 MED ORDER — LIP MEDEX EX OINT
TOPICAL_OINTMENT | CUTANEOUS | Status: DC | PRN
  Filled 2020-03-04: qty 7

## 2020-03-04 MED ORDER — POTASSIUM CHLORIDE CRYS ER 20 MEQ PO TBCR
40.0000 meq | EXTENDED_RELEASE_TABLET | Freq: Once | ORAL | Status: AC
  Administered 2020-03-04: 40 meq via ORAL
  Filled 2020-03-04: qty 2

## 2020-03-04 MED ORDER — PANTOPRAZOLE SODIUM 40 MG PO TBEC
40.0000 mg | DELAYED_RELEASE_TABLET | Freq: Every day | ORAL | Status: DC
  Administered 2020-03-04: 40 mg via ORAL
  Filled 2020-03-04 (×2): qty 1

## 2020-03-04 MED ORDER — ONDANSETRON HCL 4 MG PO TABS: 4.0000 mg | ORAL_TABLET | Freq: Four times a day (QID) | ORAL | 0 refills | Status: AC | PRN

## 2020-03-04 NOTE — Evaluation (Signed)
Physical Therapy Evaluation Patient Details Name: FRANKEE GRITZ MRN: [id number] DOB: [birth year] Today's Date: 03/04/2020   History of Present Illness  The pt is a 79 yo female presenting with nausea and vomiting. Pt admitted for continued work-up of AKI, UTI, and dehydration. PMH includes: glaucoma, HTN, HCL, psoriatic arthritis, and DOE.    Clinical Impression  Pt in bed upon arrival of PT, agreeable to evaluation at this time. Prior to admission the pt was independent with intermittent use of a cane at home for mobility, and independent with ADLs but with increasing concern about safety with bathing recently. The pt now presents with limitations in functional mobility, power, activity tolerance, strength, and stability due to above dx, and will continue to benefit from skilled PT to address these deficits. The pt was able to demo good independence with bed mobility, but did require supervision for prolonged ambulation in the hallway due to significant fatigue with mobility and onset of 3/4 DOE. The pt now requires use of a RW, and will benefit from skilled PT to progress functional strength and stability with her mobility in the home to allow her to return to independence and to resume taking care of her foster dog. The pt does not have any family who can visit to assist and would benefit from a [facility] aide to provide supervision and assist with ADLs such as bathing to further reduce the pt's risk of falls and fall-related injuries in the home.      Follow Up Recommendations Home health PT    Equipment Recommendations  Rolling walker with 5" wheels;3in1 (PT) (youth height RW (pt is 4\' 9" ), HH aide if possible)    Recommendations for Other Services       Precautions / Restrictions Precautions Precautions: Fall Precaution Comments: watch SpO2 Restrictions Weight Bearing Restrictions: No      Mobility  Bed Mobility Overal bed mobility: Modified Independent             General bed  mobility comments: increased time, use of bed rails, HOB elevated but no assist needed  Transfers Overall transfer level: Needs assistance Equipment used: None;Rolling walker (2 wheeled) Transfers: Sit to/from Stand Sit to Stand: Min guard         General transfer comment: initially trialled without AD, pt able to stand and pivot but minA to steady. minG with use of RW  Ambulation/Gait Ambulation/Gait assistance: Min guard Gait Distance (Feet): 125 Feet ((broken up into smaller bouts. multiple standing rest breaks)) Assistive device: Rolling walker (2 wheeled) Gait Pattern/deviations: Step-through pattern;Trunk flexed Gait velocity: 0.3 m/s Gait velocity interpretation: <1.31 ft/sec, indicative of household ambulator General Gait Details: pt with trunk flexed and step through pattern, short strides with rapid onset of fatigue (~30 ft) SpO2 >90% for most of ambulation, noted to be 84% after 100 ft but recovered to 89% with standing rest with guided breathing     Balance Overall balance assessment: Mild deficits observed, not formally tested                                           Pertinent Vitals/Pain Pain Assessment: No/denies pain    Home Living Family/patient expects to be discharged to:: Private residence Living Arrangements: Alone Available Help at Discharge: Other (Comment) (none, pt lives alone with no nearby contacts or friends) Type of Home: Apartment Home Access: Level entry  Home Layout: One level Home Equipment: Cane - single point Additional Comments: reports she has ordered shower chair, and has added one grab bar to the edge of her tub    Prior Function Level of Independence: Needs assistance   Gait / Transfers Assistance Needed: pt reports use of cane to move in her home, but has been unable to make it to the bathroom due to urinary urgency.  ADL's / Homemaking Assistance Needed: assist for cleaning each week, but able to manage  meals. reports independence with ADLs but concerned for her balance, orders groceries to the home  Comments: reports stumbling "regularly" and falling about 1x/month     Hand Dominance   Dominant Hand: Right    Extremity/Trunk Assessment   Upper Extremity Assessment Upper Extremity Assessment: Overall WFL for tasks assessed    Lower Extremity Assessment Lower Extremity Assessment: Generalized weakness (able to ambulate without buckling, but fatigues quickly bilaterally)    Cervical / Trunk Assessment Cervical / Trunk Assessment: Normal  Communication   Communication: HOH  Cognition Arousal/Alertness: Awake/alert Behavior During Therapy: WFL for tasks assessed/performed Overall Cognitive Status: Within Functional Limits for tasks assessed                                 General Comments: generally functional conversationally, good safety awareness and understanding of need for assist/supervision      General Comments General comments (skin integrity, edema, etc.): pt reports SOB with prolonged ambulation, educated in energy conservation and self-monitoring of exertion. 3/4 DOE with SpO2 as low as 84% on RA, but recovered within 30 sec to 90% with standing rest and guided breathing. (may have been poor quality reading)        Assessment/Plan    PT Assessment Patient needs continued PT services  PT Problem List Decreased strength;Decreased mobility;Decreased activity tolerance;Decreased balance;Decreased knowledge of use of DME       PT Treatment Interventions DME instruction;Therapeutic exercise;Gait training;Balance training;Functional mobility training;Therapeutic activities;Patient/family education    PT Goals (Current goals can be found in the Care Plan section)  Acute Rehab PT Goals Patient Stated Goal: return home and improve strength PT Goal Formulation: With patient Time For Goal Achievement: 03/18/20 Potential to Achieve Goals: Good     Frequency Min 3X/week   Barriers to discharge Decreased caregiver support pt reports she does not have anyone who can provide supervision, only a few family members who can call to check on her       AM-PAC PT "6 Clicks" Mobility  Outcome Measure Help needed turning from your back to your side while in a flat bed without using bedrails?: None Help needed moving from lying on your back to sitting on the side of a flat bed without using bedrails?: None Help needed moving to and from a bed to a chair (including a wheelchair)?: A Little Help needed standing up from a chair using your arms (e.g., wheelchair or bedside chair)?: None Help needed to walk in hospital room?: A Little Help needed climbing 3-5 steps with a railing? : A Little 6 Click Score: 21    End of Session Equipment Utilized During Treatment: Gait belt Activity Tolerance: Patient tolerated treatment well;Patient limited by fatigue Patient left: in bed;with call bell/phone within reach;with bed alarm set Nurse Communication: Mobility status PT Visit Diagnosis: Difficulty in walking, not elsewhere classified (R26.2)    Time: 4166-0630 PT Time Calculation (min) (ACUTE ONLY): 38 min  Charges:   PT Evaluation $PT Eval Moderate Complexity: 1 Mod PT Treatments $Gait Training: 23-37 mins        Karma Ganja, PT, DPT   Acute Rehabilitation Department Pager #: [phone]  Otho Bellows 03/04/2020, 11:05 AM

## 2020-03-04 NOTE — Progress Notes (Signed)
Inpatient Diabetes Program Recommendations  AACE/ADA: New Consensus Statement on Inpatient Glycemic Control   Target Ranges:  Prepandial:   less than 140 mg/dL      Peak postprandial:   less than 180 mg/dL (1-2 hours)      Critically ill patients:  140 - 180 mg/dL   Results for DORITA, ROWLANDS (MRN [id number]) as of 03/04/2020 12:10  Ref. Range 03/03/2020 23:09 03/04/2020 03:59 03/04/2020 04:33 03/04/2020 07:25 03/04/2020 11:26  Glucose-Capillary Latest Ref Range: 70 - 99 mg/dL 207 (H)  Novolog 5 units  Lantus 15 units 57 (L) 78 154 (H)  Novolog 3 units 96  Results for TAMASHA, LAPLANTE (MRN [id number]) as of 03/04/2020 12:10  Ref. Range 03/03/2020 06:55 03/03/2020 12:28 03/03/2020 16:23 03/03/2020 19:05 03/03/2020 19:53  Glucose-Capillary Latest Ref Range: 70 - 99 mg/dL 360 (H)  Novolog 15 units 73 150 (H) 129 (H) 177 (H)  Novolog 3 units   Review of Glycemic Control  Diabetes history: DM2 Outpatient Diabetes medications: Amaryl 2 mg daily, Tresiba 22 units daily, Ozempic started on 02/27/20 and patient had Nausea and vomitting- d/c'd on 03/01/20 Current orders for Inpatient glycemic control: Lantus 15 units QHS, Novolog 0-15 units Q4H  Inpatient Diabetes Program Recommendations:    Insulin-Hypoglycemia (57 mg/dl at 3:59 am today) likely due to Novolog correction given at 23:12 on 03/03/20. If patient is eating, please consider changing frequency of Novolog to 0-15 units TID with meals and Novolog 0-5 units QHS.  Thanks, Barnie Alderman, RN, MSN, CDE Diabetes Coordinator Inpatient Diabetes Program [phone] (Team Pager from 8am to 5pm)

## 2020-03-04 NOTE — TOC Progression Note (Addendum)
Transition of Care [facility]) - Progression Note    Patient Details  Name: LASHANNA ANGELO MRN: [id number] Date of Birth: [birth year]  Transition of Care [facility]) CM/SW Contact  Dontee Jaso, Edson Snowball, RN Phone Number: 03/04/2020, 12:14 PM  Clinical Narrative:     Ordered walker (youth) and 3 in1 with Freda Munro with [facility]  . Shower chair already delivered.   Offered choice for home health. Patient would like [facility]. Called Cory with [facility] awaiting call back. Tommi Rumps with [facility] has accepted referral for home health   Expected Discharge Plan: [place] Barriers to Discharge: Continued Medical Work up  Expected Discharge Plan and Services Expected Discharge Plan: [place]   Discharge Planning Services: CM Consult Post Acute Care Choice: Home Health, Durable Medical Equipment Living arrangements for the past 2 months: Single Family Home                 DME Arranged: 3-N-1, Walker rolling DME Agency: [employer] Date DME Agency Contacted: 03/04/20 Time DME Agency Contacted: 1214 Representative spoke with at DME Agency: Left message HH Arranged: PT           Social Determinants of Health (SDOH) Interventions    Readmission Risk Interventions No flowsheet data found.

## 2020-03-04 NOTE — Progress Notes (Signed)
PROGRESS NOTE    Christina Bishop  KKX:[id number] DOB: Oct 03, 1940 DOA: 03/02/2020 PCP: Lillard Anes, MD    Chief Complaint  Patient presents with  . Nausea  . Emesis    Brief Narrative:   79 year old lady with prior history of insulin-dependent DM, hypertension, COPD, paroxysmal atrial fibrillation presents to ED with nausea vomiting.  Patient was recently started on Ozempic following which patient had persistent nausea and vomiting.  She discontinued Ozempic and was referred to ED for persistent symptoms on arrival to ED she was found to be in mild AKI and elevated lactic acid.  Urine analysis showed leukocytosis. She was admitted by [facility] for evaluation of UTI, AKI and dehydration  Assessment & Plan:   Principal Problem:   AKI (acute kidney injury) ([place]) Active Problems:   Atrial fibrillation (HCC)   COPD (chronic obstructive pulmonary disease) (HCC)   Vomiting   Chronic diastolic CHF (congestive heart failure) ([place])   Insulin-requiring or dependent type II diabetes mellitus ([place])   Hypertensive urgency   Nausea and vomiting: Differential include from Ozempic vs from gastroparesis vs acute gastroenteritis.  Improved.  Continue with IV fluids for another 24 hours.  She was on full liquid diet and advanced to soft diet today.    AKI; Probably secondary to dehydration from persistent vomiting.  Improving.  Continue with IV fluids for another 24 hours.    Paroxysmal atrial fibrillation:  Rate controlled.  Not on anti coagulation per her wishes.    Hypokalemia;  Replaced.    Hyperlipidemia:  Resume pravachol.    DM insulin dependent.  CBG (last 3)  Recent Labs    03/04/20 0433 03/04/20 0725 03/04/20 1126  GLUCAP 78 154* 96   Resume SSI and lantus 15 units daily.   Chronic diastolic heart failure:  She appears compensated.    COPD:  No wheezing heard on exam.  Resume bronchodilators as needed.     DVT prophylaxis: (Heparin) Code  Status: (Full/code. ) Family Communication: none at bedside.  Disposition:   Status is: Observation  The patient will require care spanning > 2 midnights and should be moved to inpatient because: IV treatments appropriate due to intensity of illness or inability to take PO  Dispo: The patient is from: Home              Anticipated d/c is to: Home              Anticipated d/c date is: 1 day              Patient currently is not medically stable to d/c.       Consultants:   None.    Procedures: NONE  Antimicrobials: rocephin for UTI.      Subjective: Feels like getting yeast infection to the groin area.   Objective: Vitals:   03/03/20 2052 03/03/20 2304 03/04/20 0406 03/04/20 1250  BP:  137/70 (!) 130/48 (!) 129/46  Pulse:  67 68 68  Resp:  16 16 16   Temp:  98.1 F (36.7 C) 97.7 F (36.5 C) 98 F (36.7 C)  TempSrc:  Oral Oral Oral  SpO2: (!) 89% 97% 95% 97%  Weight:      Height:        Intake/Output Summary (Last 24 hours) at 03/04/2020 1500 Last data filed at 03/04/2020 1020 Gross per 24 hour  Intake 120 ml  Output 200 ml  Net -80 ml   Filed Weights   03/02/20 1320 03/03/20 0656  Weight: 83.9 kg 86.4 kg    Examination:  General exam: Appears calm and comfortable  Respiratory system: Clear to auscultation. Respiratory effort normal. Cardiovascular system: S1 & S2 heard, RRR. No JVD,No pedal edema. Gastrointestinal system: Abdomen is nondistended, soft and nontender.Normal bowel sounds heard. Central nervous system: Alert and oriented. No focal neurological deficits. Extremities: Symmetric 5 x 5 power. Skin: No rashes, lesions or ulcers Psychiatry:  Mood & affect appropriate.     Data Reviewed: I have personally reviewed following labs and imaging studies  CBC: Recent Labs  Lab 03/02/20 1330 03/03/20 1753 03/04/20 0650  WBC 14.0* 10.9* 7.1  NEUTROABS  --  6.6  --   HGB 17.0* 13.3 13.2  HCT 51.1* 40.1 40.8  MCV 83.0 84.1 85.9  PLT 473*  279 270    Basic Metabolic Panel: Recent Labs  Lab 03/02/20 1330 03/03/20 1753 03/04/20 0650  NA 133* 137 139  K 3.5 3.2* 3.2*  CL 91* 101 105  CO2 23 25 26   GLUCOSE 305* 151* 103*  BUN 45* 39* 31*  CREATININE 1.70* 1.36* 1.15*  CALCIUM 10.0 8.4* 8.5*    GFR: Estimated Creatinine Clearance: 37.3 mL/min (A) (by C-G formula based on SCr of 1.15 mg/dL (H)).  Liver Function Tests: Recent Labs  Lab 03/02/20 1330  AST 24  ALT 17  ALKPHOS 76  BILITOT 1.1  PROT 7.8  ALBUMIN 4.6    CBG: Recent Labs  Lab 03/03/20 2309 03/04/20 0359 03/04/20 0433 03/04/20 0725 03/04/20 1126  GLUCAP 207* 57* 78 154* 96     Recent Results (from the past 240 hour(s))  SARS Coronavirus 2 by RT PCR (hospital order, performed in [facility] hospital lab) Nasopharyngeal Nasopharyngeal Swab     Status: None   Collection Time: 03/03/20  2:54 AM   Specimen: Nasopharyngeal Swab  Result Value Ref Range Status   SARS Coronavirus 2 NEGATIVE NEGATIVE Final    Comment: (NOTE) SARS-CoV-2 target nucleic acids are NOT DETECTED.  The SARS-CoV-2 RNA is generally detectable in upper and lower respiratory specimens during the acute phase of infection. The lowest concentration of SARS-CoV-2 viral copies this assay can detect is 250 copies / mL. A negative result does not preclude SARS-CoV-2 infection and should not be used as the sole basis for treatment or other patient management decisions.  A negative result may occur with improper specimen collection / handling, submission of specimen other than nasopharyngeal swab, presence of viral mutation(s) within the areas targeted by this assay, and inadequate number of viral copies (<250 copies / mL). A negative result must be combined with clinical observations, patient history, and epidemiological information.  Fact Sheet for Patients:   StrictlyIdeas.no  Fact Sheet for Healthcare  Providers: BankingDealers.co.za  This test is not yet approved or  cleared by the [place] FDA and has been authorized for detection and/or diagnosis of SARS-CoV-2 by FDA under an Emergency Use Authorization (EUA).  This EUA will remain in effect (meaning this test can be used) for the duration of the COVID-19 declaration under Section 564(b)(1) of the Act, 21 U.S.C. section 360bbb-3(b)(1), unless the authorization is terminated or revoked sooner.  Performed at [facility], [place] [address] [place], [place] [address]          Radiology Studies: No results found.      Scheduled Meds: . amLODipine  10 mg Oral Daily  . budesonide (PULMICORT) nebulizer solution  0.25 mg Nebulization BID   And  . arformoterol  15 mcg Nebulization BID   And  . umeclidinium bromide  1 puff Inhalation Daily  . cloNIDine  0.2 mg Oral BID  . heparin  5,000 Units Subcutaneous Q8H  . insulin aspart  0-15 Units Subcutaneous Q4H  . insulin glargine  15 Units Subcutaneous QHS  . pravastatin  40 mg Oral Daily  . sertraline  100 mg Oral Daily   Continuous Infusions: . cefTRIAXone (ROCEPHIN)  IV 1 g (03/04/20 0225)     LOS: 0 days        Hosie Poisson, MD [facility]   To contact the attending provider between 7A-7P or the covering provider during after hours 7P-7A, please log into the web site [email or web address] and access using universal [place] password for that web site. If you do not have the password, please call the hospital operator.  03/04/2020, 3:00 PM

## 2020-03-05 DIAGNOSIS — N179 Acute kidney failure, unspecified: Secondary | ICD-10-CM | POA: Diagnosis not present

## 2020-03-05 DIAGNOSIS — J42 Unspecified chronic bronchitis: Secondary | ICD-10-CM | POA: Diagnosis not present

## 2020-03-05 DIAGNOSIS — Z794 Long term (current) use of insulin: Secondary | ICD-10-CM | POA: Diagnosis not present

## 2020-03-05 DIAGNOSIS — E119 Type 2 diabetes mellitus without complications: Secondary | ICD-10-CM | POA: Diagnosis not present

## 2020-03-05 DIAGNOSIS — I5032 Chronic diastolic (congestive) heart failure: Secondary | ICD-10-CM | POA: Diagnosis not present

## 2020-03-05 DIAGNOSIS — I480 Paroxysmal atrial fibrillation: Secondary | ICD-10-CM | POA: Diagnosis not present

## 2020-03-05 LAB — GLUCOSE, CAPILLARY
Glucose-Capillary: 119 mg/dL — ABNORMAL HIGH (ref 70–99)
Glucose-Capillary: 102 mg/dL — ABNORMAL HIGH (ref 70–99)
Glucose-Capillary: 203 mg/dL — ABNORMAL HIGH (ref 70–99)
Glucose-Capillary: 108 mg/dL — ABNORMAL HIGH (ref 70–99)

## 2020-03-05 LAB — BASIC METABOLIC PANEL
Anion gap: 6 (ref 5–15)
BUN: 21 mg/dL (ref 8–23)
CO2: 24 mmol/L (ref 22–32)
Calcium: 8.4 mg/dL — ABNORMAL LOW (ref 8.9–10.3)
Chloride: 109 mmol/L (ref 98–111)
Creatinine, Ser: 0.87 mg/dL (ref 0.44–1.00)
GFR calc Af Amer: >60 mL/min (ref >60)
GFR calc non Af Amer: >60 mL/min (ref >60)
Glucose, Bld: 128 mg/dL — ABNORMAL HIGH (ref 70–99)
Potassium: 3.6 mmol/L (ref 3.5–5.1)
Sodium: 139 mmol/L (ref 135–145)

## 2020-03-05 LAB — CBC
HCT: 36.8 % (ref 36.0–46.0)
Hemoglobin: 11.9 g/dL — ABNORMAL LOW (ref 12.0–15.0)
MCH: 28 pg (ref 26.0–34.0)
MCHC: 32.3 g/dL (ref 30.0–36.0)
MCV: 86.6 fL (ref 80.0–100.0)
Platelets: 221 10*3/uL (ref 150–400)
RBC: 4.25 MIL/uL (ref 3.87–5.11)
RDW: 13.2 % (ref 11.5–15.5)
WBC: 5.7 10*3/uL (ref 4.0–10.5)
nRBC: 0 % (ref 0.0–0.2)

## 2020-03-05 LAB — URINE CULTURE: Culture: NO GROWTH

## 2020-03-05 MED ORDER — FUROSEMIDE 20 MG PO TABS
20.0000 mg | ORAL_TABLET | Freq: Every day | ORAL | 2 refills | Status: DC
Start: 2020-03-16 — End: 2020-03-10

## 2020-03-05 MED ORDER — CEPHALEXIN 500 MG PO CAPS: 500.0000 mg | ORAL_CAPSULE | Freq: Two times a day (BID) | ORAL | 0 refills | Status: AC

## 2020-03-05 NOTE — Progress Notes (Signed)
Pt's IV removed, catheter intact. Pt d/c education provided at bedside. Pt has all belongings. Pt discharged via wheelchair with NT.

## 2020-03-06 NOTE — Discharge Summary (Signed)
Physician Discharge Summary  Christina Bishop BTD:[id number] DOB: 11-12-1940 DOA: 03/02/2020  PCP: Lillard Anes, MD  Admit date: 03/02/2020 Discharge date: 03/05/2020  Admitted From: Home.  Disposition:  Home.   Recommendations for Outpatient Follow-up:  1. Follow up with PCP in 1-2 weeks 2. Please obtain BMP/CBC in one week Please follow up pulmonology in 2 weeks for evaluation of COPD.   Discharge Condition: GUARDED.  CODE STATUS: full code.  Diet recommendation: Heart Healthy   Brief/Interim Summary: 79 year old lady with prior history of insulin-dependent DM, hypertension, COPD, paroxysmal atrial fibrillation presents to ED with nausea vomiting. Patient was recently started on Ozempic following which patient had persistent nausea and vomiting. She discontinued Ozempic and was referred to ED for persistent symptoms on arrival to ED she was found to be in mild AKI and elevated lactic acid. Urine analysis showed leukocytosis. She was admitted by [facility] for evaluation of UTI, AKI and dehydration  Discharge Diagnoses:  Principal Problem:   AKI (acute kidney injury) ([place]) Active Problems:   Atrial fibrillation (HCC)   COPD (chronic obstructive pulmonary disease) (HCC)   Vomiting   Chronic diastolic CHF (congestive heart failure) ([place])   Insulin-requiring or dependent type II diabetes mellitus ([place])   Hypertensive urgency  Nausea and vomiting: Differential include from Ozempic vs from gastroparesis vs acute gastroenteritis.  Resolved.  No new complaints.    AKI; Probably secondary to dehydration from persistent vomiting.  Resolved.    Paroxysmal atrial fibrillation:  Rate controlled.  Not on anti coagulation per her wishes.    Hypokalemia;  Replaced.    Hyperlipidemia:  Resume pravachol.    DM insulin dependent.  Resume home meds on discharge.   Chronic diastolic heart failure:  She appears compensated.    COPD:  No wheezing heard on  exam.  Resume bronchodilators as needed.      Discharge Instructions  Discharge Instructions    Diet - low sodium heart healthy   Complete by: As directed    Discharge instructions   Complete by: As directed    PLEASE follow up with PCP in one week.     Allergies as of 03/05/2020      Reactions   Ace Inhibitors Other (See Comments)   dyspnea   Codeine Itching   Metoprolol Other (See Comments)   Hallucinations      Medication List    STOP taking these medications   levofloxacin 500 MG tablet Commonly known as: LEVAQUIN   Ozempic (0.25 or 0.5 MG/DOSE) 2 MG/1.5ML Sopn Generic drug: Semaglutide(0.25 or 0.5MG /DOS)   predniSONE 5 MG tablet Commonly known as: DELTASONE     TAKE these medications   Accu-Chek Aviva Plus test strip Generic drug: glucose blood   Accu-Chek Aviva Soln   Accu-Chek Softclix Lancets lancets   acetaminophen 325 MG tablet Commonly known as: TYLENOL Take 2 tablets (650 mg total) by mouth every 4 (four) hours as needed for headache or mild pain.   amLODipine 10 MG tablet Commonly known as: NORVASC Take 1 tablet (10 mg total) by mouth daily.   B-D SINGLE USE SWABS REGULAR Pads   Breztri Aerosphere 160-9-4.8 MCG/ACT Aero Generic drug: Budeson-Glycopyrrol-Formoterol Inhale 2 puffs into the lungs in the morning and at bedtime.   brimonidine 0.2 % ophthalmic solution Commonly known as: ALPHAGAN Place 1 drop into both eyes 2 (two) times daily.   cephALEXin 500 MG capsule Commonly known as: KEFLEX Take 1 capsule (500 mg total) by mouth 2 (two) times daily for  2 days.   cloNIDine 0.2 MG tablet Commonly known as: CATAPRES Take 1 tablet (0.2 mg total) by mouth 2 (two) times daily.   dorzolamide-timolol 22.3-6.8 MG/ML ophthalmic solution Commonly known as: COSOPT Place 1 drop into both eyes 2 (two) times daily.   esomeprazole 40 MG capsule Commonly known as: NEXIUM Take 40 mg by mouth daily.   furosemide 20 MG tablet Commonly known  as: LASIX Take 1 tablet (20 mg total) by mouth daily. Start taking on: March 16, 2020 What changed: These instructions start on March 16, 2020. If you are unsure what to do until then, ask your doctor or other care provider.   glimepiride 2 MG tablet Commonly known as: AMARYL Take 2 mg by mouth daily with breakfast.   latanoprost 0.005 % ophthalmic solution Commonly known as: XALATAN Place 1 drop into both eyes every evening.   NovoFine Plus 32G X 4 MM Misc Generic drug: Insulin Pen Needle by Does not apply route.   ondansetron 4 MG tablet Commonly known as: ZOFRAN Take 1 tablet (4 mg total) by mouth every 6 (six) hours as needed for nausea. What changed:   medication strength  how much to take  when to take this  reasons to take this   pravastatin 40 MG tablet Commonly known as: PRAVACHOL Take 40 mg by mouth daily.   sertraline 100 MG tablet Commonly known as: ZOLOFT Take 100 mg by mouth daily.   Tyler Aas FlexTouch 100 UNIT/ML FlexTouch Pen Generic drug: insulin degludec Inject 22 Units into the skin daily.       Follow-up Information    Lillard Anes, MD. Schedule an appointment as soon as possible for a visit in 1 week(s).   Specialty: Family Medicine Contact information: [address] [place] [address] [phone]        Care, [facility] Follow up.   Specialty: Home Health Services Contact information: [place] [address] [phone]              Allergies  Allergen Reactions  . Ace Inhibitors Other (See Comments)    dyspnea  . Codeine Itching  . Metoprolol Other (See Comments)    Hallucinations    Consultations:  None.    Procedures/Studies:  No results found.    Subjective: No new complaints.   Discharge Exam: Vitals:   03/05/20 0520 03/05/20 1146  BP: (!) 151/50 (!) 151/48  Pulse: 60 65  Resp: 20 20  Temp: 97.9 F (36.6 C) 98.5 F (36.9 C)   SpO2: 97% 98%   Vitals:   03/04/20 2112 03/05/20 0007 03/05/20 0520 03/05/20 1146  BP:  (!) 145/43 (!) 151/50 (!) 151/48  Pulse: 78 63 60 65  Resp: 16 20 20 20   Temp:  98.6 F (37 C) 97.9 F (36.6 C) 98.5 F (36.9 C)  TempSrc:  Oral Oral Oral  SpO2: 98% 95% 97% 98%  Weight:      Height:        General: Pt is alert, awake, not in acute distress Cardiovascular: RRR, S1/S2 +, no rubs, no gallops Respiratory: CTA bilaterally, no wheezing, no rhonchi Abdominal: Soft, NT, ND, bowel sounds + Extremities: no edema, no cyanosis    The results of significant diagnostics from this hospitalization (including imaging, microbiology, ancillary and laboratory) are listed below for reference.     Microbiology: Recent Results (from the past 240 hour(s))  SARS Coronavirus 2 by RT PCR (hospital order, performed  in [place] lab) Nasopharyngeal Nasopharyngeal Swab     Status: None   Collection Time: 03/03/20  2:54 AM   Specimen: Nasopharyngeal Swab  Result Value Ref Range Status   SARS Coronavirus 2 NEGATIVE NEGATIVE Final    Comment: (NOTE) SARS-CoV-2 target nucleic acids are NOT DETECTED.  The SARS-CoV-2 RNA is generally detectable in upper and lower respiratory specimens during the acute phase of infection. The lowest concentration of SARS-CoV-2 viral copies this assay can detect is 250 copies / mL. A negative result does not preclude SARS-CoV-2 infection and should not be used as the sole basis for treatment or other patient management decisions.  A negative result may occur with improper specimen collection / handling, submission of specimen other than nasopharyngeal swab, presence of viral mutation(s) within the areas targeted by this assay, and inadequate number of viral copies (<250 copies / mL). A negative result must be combined with clinical observations, patient history, and epidemiological information.  Fact Sheet for Patients:    StrictlyIdeas.no  Fact Sheet for Healthcare Providers: BankingDealers.co.za  This test is not yet approved or  cleared by the [place] FDA and has been authorized for detection and/or diagnosis of SARS-CoV-2 by FDA under an Emergency Use Authorization (EUA).  This EUA will remain in effect (meaning this test can be used) for the duration of the COVID-19 declaration under Section 564(b)(1) of the Act, 21 U.S.C. section 360bbb-3(b)(1), unless the authorization is terminated or revoked sooner.  Performed at [facility], [place] [address] [place], [place] [address]   Urine culture     Status: None   Collection Time: 03/04/20  6:33 AM   Specimen: Urine, Random  Result Value Ref Range Status   Specimen Description URINE, RANDOM  Final   Special Requests NONE  Final   Culture   Final    NO GROWTH Performed at [facility], [place] [address], [place], [place] [address]    Report Status 03/05/2020 FINAL  Final     Labs: BNP (last 3 results) No results for input(s): BNP in the last 8760 hours. Basic Metabolic Panel: Recent Labs  Lab 03/02/20 1330 03/03/20 1753 03/04/20 0650 03/05/20 0312  NA 133* 137 139 139  K 3.5 3.2* 3.2* 3.6  CL 91* 101 105 109  CO2 23 25 26 24   GLUCOSE 305* 151* 103* 128*  BUN 45* 39* 31* 21  CREATININE 1.70* 1.36* 1.15* 0.87  CALCIUM 10.0 8.4* 8.5* 8.4*   Liver Function Tests: Recent Labs  Lab 03/02/20 1330  AST 24  ALT 17  ALKPHOS 76  BILITOT 1.1  PROT 7.8  ALBUMIN 4.6   Recent Labs  Lab 03/02/20 1330  LIPASE 31   No results for input(s): AMMONIA in the last 168 hours. CBC: Recent Labs  Lab 03/02/20 1330 03/03/20 1753 03/04/20 0650 03/05/20 0312  WBC 14.0* 10.9* 7.1 5.7  NEUTROABS  --  6.6  --   --   HGB 17.0* 13.3 13.2 11.9*  HCT 51.1* 40.1 40.8 36.8  MCV 83.0 84.1 85.9 86.6  PLT 473* 279 270 221   Cardiac Enzymes: No results for input(s): CKTOTAL, CKMB,  CKMBINDEX, TROPONINI in the last 168 hours. BNP: Invalid input(s): POCBNP CBG: Recent Labs  Lab 03/04/20 2131 03/05/20 0118 03/05/20 0608 03/05/20 1113 03/05/20 1559  GLUCAP 172* 119* 102* 203* 108*   D-Dimer No results for input(s): DDIMER in the last 72 hours. Hgb A1c No results for input(s): HGBA1C in the last  72 hours. Lipid Profile No results for input(s): CHOL, HDL, LDLCALC, TRIG, CHOLHDL, LDLDIRECT in the last 72 hours. Thyroid function studies No results for input(s): TSH, T4TOTAL, T3FREE, THYROIDAB in the last 72 hours.  Invalid input(s): FREET3 Anemia work up No results for input(s): VITAMINB12, FOLATE, FERRITIN, TIBC, IRON, RETICCTPCT in the last 72 hours. Urinalysis    Component Value Date/Time   COLORURINE AMBER (A) 03/02/2020 2135   APPEARANCEUR CLOUDY (A) 03/02/2020 2135   LABSPEC 1.025 03/02/2020 2135   PHURINE 5.0 03/02/2020 2135   GLUCOSEU 50 (A) 03/02/2020 2135   HGBUR NEGATIVE 03/02/2020 2135   BILIRUBINUR NEGATIVE 03/02/2020 2135   KETONESUR 5 (A) 03/02/2020 2135   PROTEINUR >=300 (A) 03/02/2020 2135   UROBILINOGEN 0.2 03/26/2014 1627   NITRITE NEGATIVE 03/02/2020 2135   LEUKOCYTESUR LARGE (A) 03/02/2020 2135   Sepsis Labs Invalid input(s): PROCALCITONIN,  WBC,  LACTICIDVEN Microbiology Recent Results (from the past 240 hour(s))  SARS Coronavirus 2 by RT PCR (hospital order, performed in [place] hospital lab) Nasopharyngeal Nasopharyngeal Swab     Status: None   Collection Time: 03/03/20  2:54 AM   Specimen: Nasopharyngeal Swab  Result Value Ref Range Status   SARS Coronavirus 2 NEGATIVE NEGATIVE Final    Comment: (NOTE) SARS-CoV-2 target nucleic acids are NOT DETECTED.  The SARS-CoV-2 RNA is generally detectable in upper and lower respiratory specimens during the acute phase of infection. The lowest concentration of SARS-CoV-2 viral copies this assay can detect is 250 copies / mL. A negative result does not preclude SARS-CoV-2  infection and should not be used as the sole basis for treatment or other patient management decisions.  A negative result may occur with improper specimen collection / handling, submission of specimen other than nasopharyngeal swab, presence of viral mutation(s) within the areas targeted by this assay, and inadequate number of viral copies (<250 copies / mL). A negative result must be combined with clinical observations, patient history, and epidemiological information.  Fact Sheet for Patients:   StrictlyIdeas.no  Fact Sheet for Healthcare Providers: BankingDealers.co.za  This test is not yet approved or  cleared by the [place] FDA and has been authorized for detection and/or diagnosis of SARS-CoV-2 by FDA under an Emergency Use Authorization (EUA).  This EUA will remain in effect (meaning this test can be used) for the duration of the COVID-19 declaration under Section 564(b)(1) of the Act, 21 U.S.C. section 360bbb-3(b)(1), unless the authorization is terminated or revoked sooner.  Performed at [facility], [place] [address] [place], [place] [address]   Urine culture     Status: None   Collection Time: 03/04/20  6:33 AM   Specimen: Urine, Random  Result Value Ref Range Status   Specimen Description URINE, RANDOM  Final   Special Requests NONE  Final   Culture   Final    NO GROWTH Performed at [facility], [place] [address], [place], [place] [address]    Report Status 03/05/2020 FINAL  Final     Time coordinating discharge: 36 minutes.   SIGNED:   Hosie Poisson, MD  [facility] 03/06/2020, 9:02 AM

## 2020-03-09 ENCOUNTER — Encounter: Payer: Self-pay | Admitting: Legal Medicine

## 2020-03-09 ENCOUNTER — Other Ambulatory Visit: Payer: Self-pay

## 2020-03-09 ENCOUNTER — Telehealth: Payer: Self-pay | Admitting: Cardiology

## 2020-03-09 ENCOUNTER — Ambulatory Visit (INDEPENDENT_AMBULATORY_CARE_PROVIDER_SITE_OTHER): Payer: Medicare HMO | Admitting: Legal Medicine

## 2020-03-09 VITALS — BP 122/74 | HR 91 | Temp 97.2°F | Resp 18 | Ht <= 58 in | Wt 189.6 lb

## 2020-03-09 DIAGNOSIS — I10 Essential (primary) hypertension: Secondary | ICD-10-CM

## 2020-03-09 DIAGNOSIS — E876 Hypokalemia: Secondary | ICD-10-CM | POA: Diagnosis not present

## 2020-03-09 DIAGNOSIS — D473 Essential (hemorrhagic) thrombocythemia: Secondary | ICD-10-CM | POA: Diagnosis not present

## 2020-03-09 DIAGNOSIS — E1121 Type 2 diabetes mellitus with diabetic nephropathy: Secondary | ICD-10-CM

## 2020-03-09 DIAGNOSIS — I5032 Chronic diastolic (congestive) heart failure: Secondary | ICD-10-CM

## 2020-03-09 DIAGNOSIS — E119 Type 2 diabetes mellitus without complications: Secondary | ICD-10-CM | POA: Diagnosis not present

## 2020-03-09 DIAGNOSIS — I110 Hypertensive heart disease with heart failure: Secondary | ICD-10-CM | POA: Diagnosis not present

## 2020-03-09 DIAGNOSIS — J449 Chronic obstructive pulmonary disease, unspecified: Secondary | ICD-10-CM | POA: Diagnosis not present

## 2020-03-09 DIAGNOSIS — N179 Acute kidney failure, unspecified: Secondary | ICD-10-CM | POA: Diagnosis not present

## 2020-03-09 DIAGNOSIS — I480 Paroxysmal atrial fibrillation: Secondary | ICD-10-CM

## 2020-03-09 DIAGNOSIS — N390 Urinary tract infection, site not specified: Secondary | ICD-10-CM | POA: Diagnosis not present

## 2020-03-09 DIAGNOSIS — D72829 Elevated white blood cell count, unspecified: Secondary | ICD-10-CM | POA: Diagnosis not present

## 2020-03-09 HISTORY — DX: Type 2 diabetes mellitus with diabetic nephropathy: E11.21

## 2020-03-09 MED ORDER — SPIRONOLACTONE 25 MG PO TABS: 25.0000 mg | ORAL_TABLET | Freq: Every day | ORAL | 3 refills | Status: AC

## 2020-03-09 MED ORDER — ISOSORBIDE MONONITRATE ER 30 MG PO TB24: 30.0000 mg | ORAL_TABLET | Freq: Every day | ORAL | 6 refills | Status: DC

## 2020-03-09 NOTE — Telephone Encounter (Signed)
Called patient scheduled her with Dr. Geraldo Pitter tomorrow.

## 2020-03-09 NOTE — Progress Notes (Signed)
Subjective:  Patient ID: Christina Bishop, female    DOB: 25-Apr-1941  Age: 79 y.o. MRN: [id number]  Chief Complaint  Patient presents with  . Transitions Of Care    Patient went to [facility] on 03/02/2020 to 03/05/2020.    HPIThe is a 79 y who presents for transition of care and reconciliation of medicines.  She was admitted 03/03/2020 for AKI and CHF.  She had adverse effect of Ozempic with nausea and vomiting.  This was stopped, she was rehydrate and found to have diastolic failure.  She also had uti that was treated, she stabilizing and discharged on 03/05/2020.  She is getting SOB with minimal walking.  Stage 3.  Patient's O2 sat at rest RA 99% and with ambulating around office O2 sat decreased to 95% and she had dip in HR to 40 transiently but quickly recovered, patient denies chest pain.  Consider ischemia  Current Outpatient Medications on File Prior to Visit  Medication Sig Dispense Refill  . ACCU-CHEK AVIVA PLUS test strip     . Accu-Chek Softclix Lancets lancets     . acetaminophen (TYLENOL) 325 MG tablet Take 2 tablets (650 mg total) by mouth every 4 (four) hours as needed for headache or mild pain.    . Alcohol Swabs (B-D SINGLE USE SWABS REGULAR) PADS     . amLODipine (NORVASC) 10 MG tablet Take 1 tablet (10 mg total) by mouth daily. 90 tablet 2  . Blood Glucose Calibration (ACCU-CHEK AVIVA) SOLN     . brimonidine (ALPHAGAN) 0.2 % ophthalmic solution Place 1 drop into both eyes 2 (two) times daily.     . Budeson-Glycopyrrol-Formoterol (BREZTRI AEROSPHERE) 160-9-4.8 MCG/ACT AERO Inhale 2 puffs into the lungs in the morning and at bedtime. 10.7 g 5  . cloNIDine (CATAPRES) 0.2 MG tablet Take 1 tablet (0.2 mg total) by mouth 2 (two) times daily. 180 tablet 3  . dorzolamide-timolol (COSOPT) 22.3-6.8 MG/ML ophthalmic solution Place 1 drop into both eyes 2 (two) times daily.     Marland Kitchen esomeprazole (NEXIUM) 40 MG capsule Take 40 mg by mouth daily.     Derrill Memo ON 03/16/2020] furosemide  (LASIX) 20 MG tablet Take 1 tablet (20 mg total) by mouth daily. 90 tablet 2  . glimepiride (AMARYL) 2 MG tablet Take 2 mg by mouth daily with breakfast.    . insulin degludec (TRESIBA FLEXTOUCH) 100 UNIT/ML SOPN FlexTouch Pen Inject 22 Units into the skin daily.     . Insulin Pen Needle (NOVOFINE PLUS) 32G X 4 MM MISC by Does not apply route.    . ondansetron (ZOFRAN) 4 MG tablet Take 1 tablet (4 mg total) by mouth every 6 (six) hours as needed for nausea. 20 tablet 0  . pravastatin (PRAVACHOL) 40 MG tablet Take 40 mg by mouth daily.     . sertraline (ZOLOFT) 100 MG tablet Take 100 mg by mouth daily.     Marland Kitchen latanoprost (XALATAN) 0.005 % ophthalmic solution Place 1 drop into both eyes every evening.      No current facility-administered medications on file prior to visit.   Past Medical History:  Diagnosis Date  . Depression   . Glaucoma   . H/O: hysterectomy   . HTN (hypertension)   . Hypercholesterolemia   . Psoriatic arthritis ([place])   . SOB (shortness of breath) on exertion    Past Surgical History:  Procedure Laterality Date  . ABDOMINAL HYSTERECTOMY    . ABDOMINAL SURGERY    .  APPENDECTOMY    . CARDIAC CATHETERIZATION    . JOINT REPLACEMENT     Both Knees  . TOTAL KNEE ARTHROPLASTY    . TUBAL LIGATION      Family History  Problem Relation Age of Onset  . Stroke Mother   . Heart attack Father   . Heart disease Brother   . Leukemia Paternal Uncle   . Diabetes Cousin   . Leukemia Cousin    Social History   Socioeconomic History  . Marital status: Legally Separated    Spouse name: Not on file  . Number of children: 2  . Years of education: 36  . Highest education level: Not on file  Occupational History  . Occupation: office work  . Occupation: CNA  Tobacco Use  . Smoking status: Never Smoker  . Smokeless tobacco: Never Used  Substance and Sexual Activity  . Alcohol use: No  . Drug use: No  . Sexual activity: Never  Other Topics Concern  . Not on file   Social History Narrative   Patient drinks about 6 cups of caffeine daily.   Patient is right handed.   Social Determinants of Health   Financial Resource Strain:   . Difficulty of Paying Living Expenses: Not on file  Food Insecurity:   . Worried About Charity fundraiser in the Last Year: Not on file  . Ran Out of Food in the Last Year: Not on file  Transportation Needs:   . Lack of Transportation (Medical): Not on file  . Lack of Transportation (Non-Medical): Not on file  Physical Activity:   . Days of Exercise per Week: Not on file  . Minutes of Exercise per Session: Not on file  Stress:   . Feeling of Stress : Not on file  Social Connections:   . Frequency of Communication with Friends and Family: Not on file  . Frequency of Social Gatherings with Friends and Family: Not on file  . Attends Religious Services: Not on file  . Active Member of Clubs or Organizations: Not on file  . Attends Archivist Meetings: Not on file  . Marital Status: Not on file    Review of Systems  Constitutional: Positive for activity change.  HENT: Negative.   Respiratory: Positive for shortness of breath. Negative for choking, chest tightness and wheezing.   Cardiovascular: Negative for chest pain, palpitations and leg swelling.  Gastrointestinal: Negative for abdominal distention and abdominal pain.  Genitourinary: Negative.   Musculoskeletal: Negative.   Neurological: Negative.   Psychiatric/Behavioral: Negative.      Objective:  BP 122/74   Pulse 91   Temp (!) 97.2 F (36.2 C) (Temporal)   Resp 18   Ht 4\' 9"  (1.448 m)   Wt 189 lb 9.6 oz (86 kg)   SpO2 98%   BMI 41.03 kg/m   BP/Weight 03/09/2020 3/97/6734 07/03/3788  Systolic BP 240 973 -  Diastolic BP 74 48 -  Wt. (Lbs) 189.6 - 190.48  BMI 41.03 - 41.22    Physical Exam Vitals reviewed.  Constitutional:      Appearance: Normal appearance.  HENT:     Right Ear: Tympanic membrane normal.     Left Ear: Tympanic  membrane normal.     Ears:     Comments: Hearing deficit    Nose: Nose normal.     Mouth/Throat:     Mouth: Mucous membranes are moist.  Eyes:     Extraocular Movements: Extraocular movements intact.  Conjunctiva/sclera: Conjunctivae normal.     Pupils: Pupils are equal, round, and reactive to light.  Cardiovascular:     Rate and Rhythm: Normal rate. Rhythm irregular.     Pulses: Normal pulses.     Heart sounds: Gallop (s4 gallop, no S3) present.   Pulmonary:     Effort: Respiratory distress present.     Breath sounds: Normal breath sounds. No wheezing or rales.  Abdominal:     General: Abdomen is flat. Bowel sounds are normal.     Palpations: Abdomen is soft.  Musculoskeletal:        General: Normal range of motion.     Cervical back: Normal range of motion and neck supple.  Skin:    General: Skin is warm and dry.     Capillary Refill: Capillary refill takes less than 2 seconds.  Neurological:     General: No focal deficit present.     Mental Status: She is alert. Mental status is at baseline.  Psychiatric:        Mood and Affect: Mood normal.        Thought Content: Thought content normal.     Diabetic Foot Exam - Simple   Simple Foot Form Diabetic Foot exam was performed with the following findings: Yes 03/09/2020  2:34 PM  Visual Inspection No deformities, no ulcerations, no other skin breakdown bilaterally: Yes Sensation Testing Intact to touch and monofilament testing bilaterally: Yes Pulse Check Posterior Tibialis and Dorsalis pulse intact bilaterally: Yes Comments      Lab Results  Component Value Date   WBC 5.7 03/05/2020   HGB 11.9 (L) 03/05/2020   HCT 36.8 03/05/2020   PLT 221 03/05/2020   GLUCOSE 128 (H) 03/05/2020   CHOL 182 02/18/2020   TRIG 101 02/18/2020   HDL 82 02/18/2020   LDLCALC 82 02/18/2020   ALT 17 03/02/2020   AST 24 03/02/2020   NA 139 03/05/2020   K 3.6 03/05/2020   CL 109 03/05/2020   CREATININE 0.87 03/05/2020   BUN 21  03/05/2020   CO2 24 03/05/2020   TSH 4.010 03/26/2014   HGBA1C 10.0 (H) 02/18/2020      Assessment & Plan:   1. Chronic diastolic CHF (congestive heart failure) (HCC) An individualized care plan was established and reinforced.  She has HFpEF The patient's disease status was assessed using clinical finding son exam today, labs, and/or other diagnostic testing such as x-rays, to determine the patient's success in meeting treatmentgoalsbased on disease-based guidelines and found to be somewhat improved. But not at goal yet. Medications prescriptions added spironolactone with her furosemide 20mg  and imdur 30mg  with her drop in HR Laboratory tests ordered to be performed today include BNP, CMP. RECOMMENDATIONS: given include see cardiology we called for appointment with Dr. Agustin Cree.  Call physician is patient gains 3 lbs in one day or 5 lbs for one week.  Call for progressive PND, orthopnea or increased pedal edema. Use walker for now.  She does not meet criteria for constant O2  2. Paroxysmal atrial fibrillation (HCC) AN INDIVIDUAL CARE PLAN Chronic atrial fibrillation was established and reinforced today.  The patient's status was assessed using clinical findings on exam, labs, and other diagnostic testing. Patient's success at meeting treatment goals based on disease specific evidence-bassed guidelines and found to be in fair control. RECOMMENDATIONS include maintaining present medicines and treatment. She is on some homeopathic anticoagulant that she does not remember.  3. Essential hypertension An individual hypertension care plan was established  and reinforced today.  The patient's status was assessed using clinical findings on exam and labs or diagnostic tests. The patient's success at meeting treatment goals on disease specific evidence-based guidelines and found to be good controlled. SELF MANAGEMENT: The patient and I together assessed ways to personally work towards obtaining the  recommended goals. RECOMMENDATIONS: avoid decongestants found in common cold remedies, decrease consumption of alcohol, perform routine monitoring of BP with home BP cuff, exercise, reduction of dietary salt, take medicines as prescribed, try not to miss doses and quit smoking.  Regular exercise and maintaining a healthy weight is needed.  Stress reduction may help. A CLINICAL SUMMARY including written plan identify barriers to care unique to individual due to social or financial issues.  We attempt to mutually creat solutions for individual and family understanding.  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  The patient's status was assessed using clinical findings on exam, labs and diagnostic testing. Patient success at meeting goals based on disease specific evidence-based guidelines and found to be improved BS controlled. Medications were assessed and patient's understanding of the medical issues , including barriers were assessed. Recommend adherence to a diabetic diet, a graduated exercise program, HgbA1c level is checked quarterly, and urine microalbumin performed yearly .  Annual mono-filament sensation testing performed. Lower blood pressure and control hyperlipidemia is important. Get annual eye exams and annual flu shots and smoking cessation discussed.  Self management goals were discussed.  5. Morbid obesity ([place]) An individualize plan was formulated for obesity using patient history and physical exam to encourage weight loss.  An evidence based program was formulated.  Patient is to cut portion size with meals and to plan physical exercise 3 days a week at least 20 minutes.  Weight watchers and other programs are helpful.  Planned amount of weight loss 10 lbs. We set up dietary consult.      Orders Placed This Encounter  Procedures  . Comprehensive metabolic panel  . CBC with Differential/Platelet  . Brain natriuretic  peptide  . Amb ref to Medical Nutrition Therapy-MNT     Follow-up: Return in about 2 weeks (around 03/23/2020) for chf.  An After Visit Summary was printed and given to the patient.  [place] [phone]

## 2020-03-09 NOTE — Telephone Encounter (Signed)
Pt c/o Shortness Of Breath: STAT if SOB developed within the last 24 hours or pt is noticeably SOB on the phone  1. Are you currently SOB (can you hear that pt is SOB on the phone)? PCP Called   2. How long have you been experiencing SOB? Continuous since 03/02/20  3. Are you SOB when sitting or when up moving around? Continuous  4. Are you currently experiencing any other symptoms? No  Pt went to her PCP today for a hospital f/u. When the patient was at her PCP she had some continuous SOB, which is what triggered the hospital visit 09/08. The PCP wanted to have the patient seen by Dr. Raliegh Ip asap. The PCP was hoping to squeeze the patient in at the [place] office with the first available provider.  Please reach out to the patient to set up the appointment

## 2020-03-09 NOTE — Telephone Encounter (Signed)
ok 

## 2020-03-10 ENCOUNTER — Encounter: Payer: Self-pay | Admitting: Cardiology

## 2020-03-10 ENCOUNTER — Ambulatory Visit (INDEPENDENT_AMBULATORY_CARE_PROVIDER_SITE_OTHER): Payer: Medicare HMO | Admitting: Cardiology

## 2020-03-10 VITALS — BP 115/75 | HR 66 | Ht <= 58 in | Wt 190.6 lb

## 2020-03-10 DIAGNOSIS — I10 Essential (primary) hypertension: Secondary | ICD-10-CM

## 2020-03-10 DIAGNOSIS — E669 Obesity, unspecified: Secondary | ICD-10-CM

## 2020-03-10 DIAGNOSIS — E1169 Type 2 diabetes mellitus with other specified complication: Secondary | ICD-10-CM

## 2020-03-10 DIAGNOSIS — E1159 Type 2 diabetes mellitus with other circulatory complications: Secondary | ICD-10-CM | POA: Diagnosis not present

## 2020-03-10 DIAGNOSIS — I5032 Chronic diastolic (congestive) heart failure: Secondary | ICD-10-CM | POA: Diagnosis not present

## 2020-03-10 DIAGNOSIS — I480 Paroxysmal atrial fibrillation: Secondary | ICD-10-CM | POA: Diagnosis not present

## 2020-03-10 DIAGNOSIS — E876 Hypokalemia: Secondary | ICD-10-CM | POA: Diagnosis not present

## 2020-03-10 DIAGNOSIS — I152 Hypertension secondary to endocrine disorders: Secondary | ICD-10-CM

## 2020-03-10 DIAGNOSIS — E7800 Pure hypercholesterolemia, unspecified: Secondary | ICD-10-CM | POA: Diagnosis not present

## 2020-03-10 DIAGNOSIS — J449 Chronic obstructive pulmonary disease, unspecified: Secondary | ICD-10-CM | POA: Diagnosis not present

## 2020-03-10 DIAGNOSIS — D473 Essential (hemorrhagic) thrombocythemia: Secondary | ICD-10-CM | POA: Diagnosis not present

## 2020-03-10 DIAGNOSIS — N390 Urinary tract infection, site not specified: Secondary | ICD-10-CM | POA: Diagnosis not present

## 2020-03-10 DIAGNOSIS — I4891 Unspecified atrial fibrillation: Secondary | ICD-10-CM

## 2020-03-10 DIAGNOSIS — I110 Hypertensive heart disease with heart failure: Secondary | ICD-10-CM | POA: Diagnosis not present

## 2020-03-10 DIAGNOSIS — D72829 Elevated white blood cell count, unspecified: Secondary | ICD-10-CM | POA: Diagnosis not present

## 2020-03-10 DIAGNOSIS — E119 Type 2 diabetes mellitus without complications: Secondary | ICD-10-CM | POA: Diagnosis not present

## 2020-03-10 LAB — COMPREHENSIVE METABOLIC PANEL
ALT: 17 IU/L (ref 0–32)
AST: 26 IU/L (ref 0–40)
Albumin/Globulin Ratio: 2.1 (ref 1.2–2.2)
Albumin: 4.6 g/dL (ref 3.7–4.7)
Alkaline Phosphatase: 81 IU/L (ref 44–121)
BUN/Creatinine Ratio: 12 (ref 12–28)
BUN: 17 mg/dL (ref 8–27)
Bilirubin Total: 0.4 mg/dL (ref 0.0–1.2)
CO2: 25 mmol/L (ref 20–29)
Calcium: 9.3 mg/dL (ref 8.7–10.3)
Chloride: 101 mmol/L (ref 96–106)
Creatinine, Ser: 1.4 mg/dL — ABNORMAL HIGH (ref 0.57–1.00)
GFR calc Af Amer: 42 mL/min/{1.73_m2} — ABNORMAL LOW (ref >59)
GFR calc non Af Amer: 36 mL/min/{1.73_m2} — ABNORMAL LOW (ref >59)
Globulin, Total: 2.2 g/dL (ref 1.5–4.5)
Glucose: 133 mg/dL — ABNORMAL HIGH (ref 65–99)
Potassium: 4.6 mmol/L (ref 3.5–5.2)
Sodium: 140 mmol/L (ref 134–144)
Total Protein: 6.8 g/dL (ref 6.0–8.5)

## 2020-03-10 LAB — CBC WITH DIFFERENTIAL/PLATELET
Basophils Absolute: 0.1 10*3/uL (ref 0.0–0.2)
Basos: 1 %
EOS (ABSOLUTE): 0.3 10*3/uL (ref 0.0–0.4)
Eos: 3 %
Hematocrit: 47.9 % — ABNORMAL HIGH (ref 34.0–46.6)
Hemoglobin: 16.2 g/dL — ABNORMAL HIGH (ref 11.1–15.9)
Immature Grans (Abs): 0.1 10*3/uL (ref 0.0–0.1)
Immature Granulocytes: 1 %
Lymphocytes Absolute: 3.3 10*3/uL — ABNORMAL HIGH (ref 0.7–3.1)
Lymphs: 27 %
MCH: 29.1 pg (ref 26.6–33.0)
MCHC: 33.8 g/dL (ref 31.5–35.7)
MCV: 86 fL (ref 79–97)
Monocytes Absolute: 1.2 10*3/uL — ABNORMAL HIGH (ref 0.1–0.9)
Monocytes: 10 %
Neutrophils Absolute: 7.2 10*3/uL — ABNORMAL HIGH (ref 1.4–7.0)
Neutrophils: 58 %
Platelets: 420 10*3/uL (ref 150–450)
RBC: 5.56 x10E6/uL — ABNORMAL HIGH (ref 3.77–5.28)
RDW: 13.6 % (ref 11.7–15.4)
WBC: 12.1 10*3/uL — ABNORMAL HIGH (ref 3.4–10.8)

## 2020-03-10 LAB — BRAIN NATRIURETIC PEPTIDE: BNP: 20.6 pg/mL (ref 0.0–100.0)

## 2020-03-10 MED ORDER — AMLODIPINE BESYLATE 5 MG PO TABS: 5.0000 mg | ORAL_TABLET | Freq: Every day | ORAL | 3 refills | Status: AC

## 2020-03-10 NOTE — Patient Instructions (Addendum)
Medication Instructions:  Your physician has recommended you make the following change in your medication:   Stop Lasix Decrease Amlodipine to 5 mg daily.   *If you need a refill on your cardiac medications before your next appointment, please call your pharmacy*   Lab Work: None ordered If you have labs (blood work) drawn today and your tests are completely normal, you will receive your results only by: Marland Kitchen MyChart Message (if you have MyChart) OR . A paper copy in the mail If you have any lab test that is abnormal or we need to change your treatment, we will call you to review the results.   Testing/Procedures: Your physician has requested that you have an echocardiogram. Echocardiography is a painless test that uses sound waves to create images of your heart. It provides your doctor with information about the size and shape of your heart and how well your heart's chambers and valves are working. This procedure takes approximately one hour. There are no restrictions for this procedure.     Follow-Up: At [facility], you and your health needs are our priority.  As part of our continuing mission to provide you with exceptional heart care, we have created designated Provider Care Teams.  These Care Teams include your primary Cardiologist (physician) and Advanced Practice Providers (APPs -  Physician Assistants and Nurse Practitioners) who all work together to provide you with the care you need, when you need it.  We recommend signing up for the patient portal called "MyChart".  Sign up information is provided on this After Visit Summary.  MyChart is used to connect with patients for Virtual Visits (Telemedicine).  Patients are able to view lab/test results, encounter notes, upcoming appointments, etc.  Non-urgent messages can be sent to your provider as well.   To learn more about what you can do with MyChart, go to [email or web address].    Your next appointment:   1 week(s)  The  format for your next appointment:   In Person  Provider:   Jyl Heinz, MD   Other Instructions   Check on Xarelto and Pradexa    Echocardiogram An echocardiogram is a procedure that uses painless sound waves (ultrasound) to produce an image of the heart. Images from an echocardiogram can provide important information about:  Signs of coronary artery disease (CAD).  Aneurysm detection. An aneurysm is a weak or damaged part of an artery wall that bulges out from the normal force of blood pumping through the body.  Heart size and shape. Changes in the size or shape of the heart can be associated with certain conditions, including heart failure, aneurysm, and CAD.  Heart muscle function.  Heart valve function.  Signs of a past heart attack.  Fluid buildup around the heart.  Thickening of the heart muscle.  A tumor or infectious growth around the heart valves. Tell a health care provider about:  Any allergies you have.  All medicines you are taking, including vitamins, herbs, eye drops, creams, and over-the-counter medicines.  Any blood disorders you have.  Any surgeries you have had.  Any medical conditions you have.  Whether you are pregnant or may be pregnant. What are the risks? Generally, this is a safe procedure. However, problems may occur, including:  Allergic reaction to dye (contrast) that may be used during the procedure. What happens before the procedure? No specific preparation is needed. You may eat and drink normally. What happens during the procedure?   An IV tube may be inserted  into one of your veins.  You may receive contrast through this tube. A contrast is an injection that improves the quality of the pictures from your heart.  A gel will be applied to your chest.  A wand-like tool (transducer) will be moved over your chest. The gel will help to transmit the sound waves from the transducer.  The sound waves will harmlessly bounce off  of your heart to allow the heart images to be captured in real-time motion. The images will be recorded on a computer. The procedure may vary among health care providers and hospitals. What happens after the procedure?  You may return to your normal, everyday life, including diet, activities, and medicines, unless your health care provider tells you not to do that. Summary  An echocardiogram is a procedure that uses painless sound waves (ultrasound) to produce an image of the heart.  Images from an echocardiogram can provide important information about the size and shape of your heart, heart muscle function, heart valve function, and fluid buildup around your heart.  You do not need to do anything to prepare before this procedure. You may eat and drink normally.  After the echocardiogram is completed, you may return to your normal, everyday life, unless your health care provider tells you not to do that. This information is not intended to replace advice given to you by your health care provider. Make sure you discuss any questions you have with your health care provider. Document Revised: 10/02/2018 Document Reviewed: 07/14/2016 Elsevier Patient Education  [place].    Heart Failure, Diagnosis  Heart failure means that your heart is not able to pump blood in the right way. This makes it hard for your body to work well. Heart failure is usually a long-term (chronic) condition. You must take good care of yourself and follow your treatment plan from your doctor. What are the causes? This condition may be caused by:  High blood pressure.  Build up of cholesterol and fat in the arteries.  Heart attack. This injures the heart muscle.  Heart valves that do not open and close properly.  Damage of the heart muscle. This is also called cardiomyopathy.  Lung disease.  Abnormal heart rhythms. What increases the risk? The risk of heart failure goes up as a person ages. This  condition is also more likely to develop in people who:  Are overweight.  Are female.  Smoke or chew tobacco.  Abuse alcohol or illegal drugs.  Have taken medicines that can damage the heart.  Have diabetes.  Have abnormal heart rhythms.  Have thyroid problems.  Have low blood counts (anemia). What are the signs or symptoms? Symptoms of this condition include:  Shortness of breath.  Coughing.  Swelling of the feet, ankles, legs, or belly.  Losing weight for no reason.  Trouble breathing.  Waking from sleep because of the need to sit up and get more air.  Rapid heartbeat.  Being very tired.  Feeling dizzy, or feeling like you may pass out (faint).  Having no desire to eat.  Feeling like you may vomit (nauseous).  Peeing (urinating) more at night.  Feeling confused. How is this treated?     This condition may be treated with:  Medicines. These can be given to treat blood pressure and to make the heart muscles stronger.  Changes in your daily life. These may include eating a healthy diet, staying at a healthy body weight, quitting tobacco and illegal drug use, or  doing exercises.  Surgery. Surgery can be done to open blocked valves, or to put devices in the heart, such as pacemakers.  A donor heart (heart transplant). You will receive a healthy heart from a donor. Follow these instructions at home:  Treat other conditions as told by your doctor. These may include high blood pressure, diabetes, thyroid disease, or abnormal heart rhythms.  Learn as much as you can about heart failure.  Get support as you need it.  Keep all follow-up visits as told by your doctor. This is important. Summary  Heart failure means that your heart is not able to pump blood in the right way.  This condition is caused by high blood pressure, heart attack, or damage of the heart muscle.  Symptoms of this condition include shortness of breath and swelling of the feet,  ankles, legs, or belly. You may also feel very tired or feel like you may vomit.  You may be treated with medicines, surgery, or changes in your daily life.  Treat other health conditions as told by your doctor. This information is not intended to replace advice given to you by your health care provider. Make sure you discuss any questions you have with your health care provider. Document Revised: 08/29/2018 Document Reviewed: 08/29/2018 Elsevier Patient Education  [place].  Heart Failure Eating Plan Heart failure, also called congestive heart failure, occurs when your heart does not pump blood well enough to meet your body's needs for oxygen-rich blood. Heart failure is a long-term (chronic) condition. Living with heart failure can be challenging. However, following your health care provider's instructions about a healthy lifestyle and working with a diet and nutrition specialist (dietitian) to choose the right foods may help to improve your symptoms. What are tips for following this plan? Reading food labels  Check food labels for the amount of sodium per serving. Choose foods that have less than 140 mg (milligrams) of sodium in each serving.  Check food labels for the number of calories per serving. This is important if you need to limit your daily calorie intake to lose weight.  Check food labels for the serving size. If you eat more than one serving, you will be eating more sodium and calories than what is listed on the label.  Look for foods that are labeled as "sodium-free," "very low sodium," or "low sodium." ? Foods labeled as "reduced sodium" or "lightly salted" may still have more sodium than what is recommended for you. Cooking  Avoid adding salt when cooking. Ask your health care provider or dietitian before using salt substitutes.  Season food with salt-free seasonings, spices, or herbs. Check the label of seasoning mixes to make sure they do not contain  salt.  Cook with heart-healthy oils, such as olive, canola, soybean, or sunflower oil.  Do not fry foods. Cook foods using low-fat methods, such as baking, boiling, grilling, and broiling.  Limit unhealthy fats when cooking by: ? Removing the skin from poultry, such as chicken. ? Removing all visible fats from meats. ? Skimming the fat off from stews, soups, and gravies before serving them. Meal planning   Limit your intake of: ? Processed, canned, or pre-packaged foods. ? Foods that are high in trans fat, such as fried foods. ? Sweets, desserts, sugary drinks, and other foods with added sugar. ? Full-fat dairy products, such as whole milk.  Eat a balanced diet that includes: ? 4-5 servings of fruit each day and 4-5 servings of vegetables each  day. At each meal, try to fill half of your plate with fruits and vegetables. ? Up to 6-8 servings of whole grains each day. ? Up to 2 servings of lean meat, poultry, or fish each day. One serving of meat is equal to 3 oz. This is about the same size as a deck of cards. ? 2 servings of low-fat dairy each day. ? Heart-healthy fats. Healthy fats called omega-3 fatty acids are found in foods such as flaxseed and cold-water fish like sardines, salmon, and mackerel.  Aim to eat 25-35 g (grams) of fiber a day. Foods that are high in fiber include apples, broccoli, carrots, beans, peas, and whole grains.  Do not add salt or condiments that contain salt (such as soy sauce) to foods before eating.  When eating at a restaurant, ask that your food be prepared with less salt or no salt, if possible.  Try to eat 2 or more vegetarian meals each week.  Eat more home-cooked food and eat less restaurant, buffet, and fast food. General information  Do not eat more than 2,300 mg of salt (sodium) a day. The amount of sodium that is recommended for you may be lower, depending on your condition.  Maintain a healthy body weight as directed. Ask your health care  provider what a healthy weight is for you. ? Check your weight every day. ? Work with your health care provider and dietitian to make a plan that is right for you to lose weight or maintain your current weight.  Limit how much fluid you drink. Ask your health care provider or dietitian how much fluid you can have each day.  Limit or avoid alcohol as told by your health care provider or dietitian. Recommended foods The items listed may not be a complete list. Talk with your dietitian about what dietary choices are best for you. Fruits All fresh, frozen, and canned fruits. Dried fruits, such as raisins, prunes, and cranberries. Vegetables All fresh vegetables. Vegetables that are frozen without sauce or added salt. Low-sodium or sodium-free canned vegetables. Grains Bread with less than 80 mg of sodium per slice. Whole-wheat pasta, quinoa, and brown rice. Oats and oatmeal. Barley. [place]. Grits and cream of wheat. Whole-grain and whole-wheat cold cereal. Meats and other protein foods Lean cuts of meat. Skinless chicken and [place]. Fish with high omega-3 fatty acids, such as salmon, sardines, and other cold-water fishes. Eggs. Dried beans, peas, and edamame. Unsalted nuts and nut butters. Dairy Low-fat or nonfat (skim) milk and dried milk. Rice milk, soy milk, and almond milk. Low-fat or nonfat yogurt. Small amounts of reduced-sodium block cheese. Low-sodium cottage cheese. Fats and oils Olive, canola, soybean, flaxseed, or sunflower oil. Avocado. Sweets and desserts Apple sauce. Granola bars. Sugar-free pudding and gelatin. Frozen fruit bars. Seasoning and other foods Fresh and dried herbs. Lemon or lime juice. Vinegar. Low-sodium ketchup. Salt-free marinades, salad dressings, sauces, and seasonings. The items listed above may not be a complete list of foods and beverages you can eat. Contact a dietitian for more information. Foods to avoid The items listed may not be a complete list. Talk  with your dietitian about what dietary choices are best for you. Fruits Fruits that are dried with sodium-containing preservatives. Vegetables Canned vegetables. Frozen vegetables with sauce or seasonings. Creamed vegetables. [place] fries. Onion rings. Pickled vegetables and sauerkraut. Grains Bread with more than 80 mg of sodium per slice. Hot or cold cereal with more than 140 mg sodium per serving. Salted pretzels  and crackers. Pre-packaged breadcrumbs. Bagels, croissants, and biscuits. Meats and other protein foods Ribs and chicken wings. Bacon, ham, pepperoni, bologna, salami, and packaged luncheon meats. Hot dogs, bratwurst, and sausage. Canned meat. Smoked meat and fish. Salted nuts and seeds. Dairy Whole milk, half-and-half, and cream. Buttermilk. Processed cheese, cheese spreads, and cheese curds. Regular cottage cheese. Feta cheese. Shredded cheese. String cheese. Fats and oils Butter, lard, shortening, ghee, and bacon fat. Canned and packaged gravies. Seasoning and other foods Onion salt, garlic salt, table salt, and sea salt. Marinades. Regular salad dressings. Relishes, pickles, and olives. Meat flavorings and tenderizers, and bouillon cubes. Horseradish, ketchup, and mustard. Worcestershire sauce. Teriyaki sauce, soy sauce (including reduced sodium). Hot sauce and Tabasco sauce. Steak sauce, fish sauce, oyster sauce, and cocktail sauce. Taco seasonings. Barbecue sauce. Tartar sauce. The items listed above may not be a complete list of foods and beverages you should avoid. Contact a dietitian for more information. Summary  A heart failure eating plan includes changes that limit your intake of sodium and unhealthy fat, and it may help you lose weight or maintain a healthy weight. Your health care provider may also recommend limiting how much fluid you drink.  Most people with heart failure should eat no more than 2,300 mg of salt (sodium) a day. The amount of sodium that is recommended  for you may be lower, depending on your condition.  Contact your health care provider or dietitian before making any major changes to your diet. This information is not intended to replace advice given to you by your health care provider. Make sure you discuss any questions you have with your health care provider. Document Revised: 08/07/2018 Document Reviewed: 10/26/2016 Elsevier Patient Education  [place].

## 2020-03-10 NOTE — Progress Notes (Signed)
Cardiology Office Note:    Date:  03/10/2020   ID:  [name], DOB 1941-03-17, MRN [id number]  PCP:  Lillard Anes, MD  Cardiologist:  Jenean Lindau, MD   Referring MD: Lillard Anes,*    ASSESSMENT:    1. Atrial fibrillation, unspecified type ([place])   2. Essential hypertension   3. Hypercholesterolemia   4. Obesity, diabetes, and hypertension syndrome ([place])   5. Morbid obesity ([place])    PLAN:    In order of problems listed above:  1. Primary prevention stressed with the patient.  Importance of compliance with diet medication stressed and she vocalized understanding. 2. Atrial fibrillation:I discussed with the patient atrial fibrillation, disease process. Management and therapy including rate and rhythm control, anticoagulation benefits and potential risks were discussed extensively with the patient. Patient had multiple questions which were answered to patient's satisfaction.  She is not keen on anticoagulation because of multiple factors.  First she mentioned to me that Eliquis was expensive.  I told her to check with the pharmacist about other medications such as Xarelto and Pradaxa and she will inquire and let me know.  I also gave her the option of anticoagulation with Coumadin but she is not keen on it.  I respect her wishes. 3. Essential hypertension: Blood pressure stable.  Is borderline therefore I have stopped her amlodipine and reduced to 5 mg daily. 4. Diuretic therapy: Patient was in the hospital with dehydration therefore I have stopped her furosemide.  I will see her in follow-up in 1 week and adjust her medications.  She was advised to weigh her self on a regular basis and she promises to do so.  Patient had multiple questions which were answered to her satisfaction.  Echocardiogram will be done to assess murmur on auscultation.   Medication Adjustments/Labs and Tests Ordered: Current medicines are reviewed at length with the patient today.   Concerns regarding medicines are outlined above.  No orders of the defined types were placed in this encounter.  No orders of the defined types were placed in this encounter.    No chief complaint on file.    History of Present Illness:    Christina Bishop is a 79 y.o. female.  Patient has past medical history of essential hypertension, atrial fibrillation, dyslipidemia and diabetes mellitus.  She is morbidly obese.  She was admitted to the hospital.  She has history of dehydration.  I reviewed records.  She denies any chest pain orthopnea or PND.  She ambulates with a walker she mentions to me that she has never had a fall.  No dizziness or any syncopal spells.  At the time of my evaluation, the patient is alert awake oriented and in no distress.  Past Medical History:  Diagnosis Date  . Depression   . Glaucoma   . H/O: hysterectomy   . HTN (hypertension)   . Hypercholesterolemia   . Psoriatic arthritis ([place])   . SOB (shortness of breath) on exertion     Past Surgical History:  Procedure Laterality Date  . ABDOMINAL HYSTERECTOMY    . ABDOMINAL SURGERY    . APPENDECTOMY    . CARDIAC CATHETERIZATION    . JOINT REPLACEMENT     Both Knees  . TOTAL KNEE ARTHROPLASTY    . TUBAL LIGATION      Current Medications: Current Meds  Medication Sig  . ACCU-CHEK AVIVA PLUS test strip   . Accu-Chek Softclix Lancets lancets   . acetaminophen (  TYLENOL) 325 MG tablet Take 2 tablets (650 mg total) by mouth every 4 (four) hours as needed for headache or mild pain.  . Alcohol Swabs (B-D SINGLE USE SWABS REGULAR) PADS   . amLODipine (NORVASC) 10 MG tablet Take 1 tablet (10 mg total) by mouth daily.  . Blood Glucose Calibration (ACCU-CHEK AVIVA) SOLN   . brimonidine (ALPHAGAN) 0.2 % ophthalmic solution Place 1 drop into both eyes 2 (two) times daily.   . Budeson-Glycopyrrol-Formoterol (BREZTRI AEROSPHERE) 160-9-4.8 MCG/ACT AERO Inhale 2 puffs into the lungs in the morning and at bedtime.  .  cloNIDine (CATAPRES) 0.2 MG tablet Take 1 tablet (0.2 mg total) by mouth 2 (two) times daily.  . dorzolamide-timolol (COSOPT) 22.3-6.8 MG/ML ophthalmic solution Place 1 drop into both eyes 2 (two) times daily.   Marland Kitchen esomeprazole (NEXIUM) 40 MG capsule Take 40 mg by mouth daily.   Derrill Memo ON 03/16/2020] furosemide (LASIX) 20 MG tablet Take 1 tablet (20 mg total) by mouth daily.  Marland Kitchen glimepiride (AMARYL) 2 MG tablet Take 2 mg by mouth daily with breakfast.  . insulin degludec (TRESIBA FLEXTOUCH) 100 UNIT/ML SOPN FlexTouch Pen Inject 22 Units into the skin daily.   . Insulin Pen Needle (NOVOFINE PLUS) 32G X 4 MM MISC by Does not apply route.  . latanoprost (XALATAN) 0.005 % ophthalmic solution Place 1 drop into both eyes every evening.   . ondansetron (ZOFRAN) 4 MG tablet Take 1 tablet (4 mg total) by mouth every 6 (six) hours as needed for nausea.  . pravastatin (PRAVACHOL) 40 MG tablet Take 40 mg by mouth daily.   . sertraline (ZOLOFT) 100 MG tablet Take 100 mg by mouth daily.      Allergies:   Ozempic (0.25 or 0.5 mg-dose) [semaglutide(0.25 or 0.5mg -dos)], Ace inhibitors, Codeine, and Metoprolol   Social History   Socioeconomic History  . Marital status: Legally Separated    Spouse name: Not on file  . Number of children: 2  . Years of education: 19  . Highest education level: Not on file  Occupational History  . Occupation: office work  . Occupation: CNA  Tobacco Use  . Smoking status: Never Smoker  . Smokeless tobacco: Never Used  Substance and Sexual Activity  . Alcohol use: No  . Drug use: No  . Sexual activity: Never  Other Topics Concern  . Not on file  Social History Narrative   Patient drinks about 6 cups of caffeine daily.   Patient is right handed.   Social Determinants of Health   Financial Resource Strain:   . Difficulty of Paying Living Expenses: Not on file  Food Insecurity:   . Worried About Charity fundraiser in the Last Year: Not on file  . Ran Out of Food  in the Last Year: Not on file  Transportation Needs:   . Lack of Transportation (Medical): Not on file  . Lack of Transportation (Non-Medical): Not on file  Physical Activity:   . Days of Exercise per Week: Not on file  . Minutes of Exercise per Session: Not on file  Stress:   . Feeling of Stress : Not on file  Social Connections:   . Frequency of Communication with Friends and Family: Not on file  . Frequency of Social Gatherings with Friends and Family: Not on file  . Attends Religious Services: Not on file  . Active Member of Clubs or Organizations: Not on file  . Attends Archivist Meetings: Not on file  .  Marital Status: Not on file     Family History: The patient's family history includes Diabetes in her cousin; Heart attack in her father; Heart disease in her brother; Leukemia in her cousin and paternal uncle; Stroke in her mother.  ROS:   Please see the history of present illness.    All other systems reviewed and are negative.  EKGs/Labs/Other Studies Reviewed:    The following studies were reviewed today: I reviewed records from hospital admission and discharge summary   Recent Labs: 03/09/2020: ALT 17; BNP 20.6; BUN 17; Creatinine, Ser 1.40; Hemoglobin 16.2; Platelets 420; Potassium 4.6; Sodium 140  Recent Lipid Panel    Component Value Date/Time   CHOL 182 02/18/2020 1437   TRIG 101 02/18/2020 1437   HDL 82 02/18/2020 1437   CHOLHDL 2.2 02/18/2020 1437   CHOLHDL 2.0 03/27/2014 0250   VLDL 16 03/27/2014 0250   LDLCALC 82 02/18/2020 1437    Physical Exam:    VS:  BP 115/75   Pulse 66   Ht 4\' 9"  (1.448 m)   Wt 190 lb 9.6 oz (86.5 kg)   SpO2 95%   BMI 41.25 kg/m     Wt Readings from Last 3 Encounters:  03/10/20 190 lb 9.6 oz (86.5 kg)  03/09/20 189 lb 9.6 oz (86 kg)  03/03/20 190 lb 7.6 oz (86.4 kg)     GEN: Patient is in no acute distress HEENT: Normal NECK: No JVD; No carotid bruits LYMPHATICS: No lymphadenopathy CARDIAC: Hear sounds  regular, 2/6 systolic murmur at the apex. RESPIRATORY:  Clear to auscultation without rales, wheezing or rhonchi  ABDOMEN: Soft, non-tender, non-distended MUSCULOSKELETAL:  No edema; No deformity  SKIN: Warm and dry NEUROLOGIC:  Alert and oriented x 3 PSYCHIATRIC:  Normal affect   Signed, Jenean Lindau, MD  03/10/2020 1:45 PM    [facility]

## 2020-03-10 NOTE — Progress Notes (Signed)
Glucose 133, kidney tests stable, potassium 4.6 good, liver tests normal, WBC high, any infection symptoms, BNP 20.6 no heart failure lp

## 2020-03-11 DIAGNOSIS — D72829 Elevated white blood cell count, unspecified: Secondary | ICD-10-CM | POA: Diagnosis not present

## 2020-03-11 DIAGNOSIS — I480 Paroxysmal atrial fibrillation: Secondary | ICD-10-CM | POA: Diagnosis not present

## 2020-03-11 DIAGNOSIS — E876 Hypokalemia: Secondary | ICD-10-CM | POA: Diagnosis not present

## 2020-03-11 DIAGNOSIS — E119 Type 2 diabetes mellitus without complications: Secondary | ICD-10-CM | POA: Diagnosis not present

## 2020-03-11 DIAGNOSIS — J449 Chronic obstructive pulmonary disease, unspecified: Secondary | ICD-10-CM | POA: Diagnosis not present

## 2020-03-11 DIAGNOSIS — N390 Urinary tract infection, site not specified: Secondary | ICD-10-CM | POA: Diagnosis not present

## 2020-03-11 DIAGNOSIS — I5032 Chronic diastolic (congestive) heart failure: Secondary | ICD-10-CM | POA: Diagnosis not present

## 2020-03-11 DIAGNOSIS — D473 Essential (hemorrhagic) thrombocythemia: Secondary | ICD-10-CM | POA: Diagnosis not present

## 2020-03-11 DIAGNOSIS — I110 Hypertensive heart disease with heart failure: Secondary | ICD-10-CM | POA: Diagnosis not present

## 2020-03-14 ENCOUNTER — Other Ambulatory Visit: Payer: Self-pay

## 2020-03-14 ENCOUNTER — Ambulatory Visit (INDEPENDENT_AMBULATORY_CARE_PROVIDER_SITE_OTHER): Payer: Medicare HMO

## 2020-03-14 DIAGNOSIS — I4891 Unspecified atrial fibrillation: Secondary | ICD-10-CM

## 2020-03-14 LAB — ECHOCARDIOGRAM COMPLETE
Area-P 1/2: 3.55 cm2
S' Lateral: 3.2 cm

## 2020-03-14 NOTE — Progress Notes (Signed)
Complete echocardiogram performed.  Jimmy Tiron Suski RDCS, RVT  

## 2020-03-15 DIAGNOSIS — E876 Hypokalemia: Secondary | ICD-10-CM | POA: Diagnosis not present

## 2020-03-15 DIAGNOSIS — D72829 Elevated white blood cell count, unspecified: Secondary | ICD-10-CM | POA: Diagnosis not present

## 2020-03-15 DIAGNOSIS — J449 Chronic obstructive pulmonary disease, unspecified: Secondary | ICD-10-CM | POA: Diagnosis not present

## 2020-03-15 DIAGNOSIS — I480 Paroxysmal atrial fibrillation: Secondary | ICD-10-CM | POA: Diagnosis not present

## 2020-03-15 DIAGNOSIS — D473 Essential (hemorrhagic) thrombocythemia: Secondary | ICD-10-CM | POA: Diagnosis not present

## 2020-03-15 DIAGNOSIS — I110 Hypertensive heart disease with heart failure: Secondary | ICD-10-CM | POA: Diagnosis not present

## 2020-03-15 DIAGNOSIS — I5032 Chronic diastolic (congestive) heart failure: Secondary | ICD-10-CM | POA: Diagnosis not present

## 2020-03-15 DIAGNOSIS — E119 Type 2 diabetes mellitus without complications: Secondary | ICD-10-CM | POA: Diagnosis not present

## 2020-03-15 DIAGNOSIS — N390 Urinary tract infection, site not specified: Secondary | ICD-10-CM | POA: Diagnosis not present

## 2020-03-16 DIAGNOSIS — N390 Urinary tract infection, site not specified: Secondary | ICD-10-CM | POA: Diagnosis not present

## 2020-03-16 DIAGNOSIS — E876 Hypokalemia: Secondary | ICD-10-CM | POA: Diagnosis not present

## 2020-03-16 DIAGNOSIS — I110 Hypertensive heart disease with heart failure: Secondary | ICD-10-CM | POA: Diagnosis not present

## 2020-03-16 DIAGNOSIS — D72829 Elevated white blood cell count, unspecified: Secondary | ICD-10-CM | POA: Diagnosis not present

## 2020-03-16 DIAGNOSIS — I480 Paroxysmal atrial fibrillation: Secondary | ICD-10-CM | POA: Diagnosis not present

## 2020-03-16 DIAGNOSIS — J449 Chronic obstructive pulmonary disease, unspecified: Secondary | ICD-10-CM | POA: Diagnosis not present

## 2020-03-16 DIAGNOSIS — E119 Type 2 diabetes mellitus without complications: Secondary | ICD-10-CM | POA: Diagnosis not present

## 2020-03-16 DIAGNOSIS — D473 Essential (hemorrhagic) thrombocythemia: Secondary | ICD-10-CM | POA: Diagnosis not present

## 2020-03-16 DIAGNOSIS — I5032 Chronic diastolic (congestive) heart failure: Secondary | ICD-10-CM | POA: Diagnosis not present

## 2020-03-17 ENCOUNTER — Other Ambulatory Visit: Payer: Self-pay

## 2020-03-17 ENCOUNTER — Ambulatory Visit: Payer: Medicare HMO | Admitting: Cardiology

## 2020-03-17 VITALS — BP 144/70 | HR 74 | Ht <= 58 in | Wt 194.2 lb

## 2020-03-17 DIAGNOSIS — I4891 Unspecified atrial fibrillation: Secondary | ICD-10-CM

## 2020-03-17 DIAGNOSIS — I951 Orthostatic hypotension: Secondary | ICD-10-CM | POA: Diagnosis not present

## 2020-03-17 DIAGNOSIS — E1121 Type 2 diabetes mellitus with diabetic nephropathy: Secondary | ICD-10-CM

## 2020-03-17 DIAGNOSIS — I10 Essential (primary) hypertension: Secondary | ICD-10-CM | POA: Diagnosis not present

## 2020-03-17 DIAGNOSIS — Z90710 Acquired absence of both cervix and uterus: Secondary | ICD-10-CM | POA: Insufficient documentation

## 2020-03-17 DIAGNOSIS — R0602 Shortness of breath: Secondary | ICD-10-CM | POA: Insufficient documentation

## 2020-03-17 MED ORDER — APIXABAN 5 MG PO TABS: 5.0000 mg | ORAL_TABLET | Freq: Two times a day (BID) | ORAL | 6 refills | Status: AC

## 2020-03-17 NOTE — Patient Instructions (Addendum)
Medication Instructions:  Your physician has recommended you make the following change in your medication:   Start Eliquis 5 mg twice daily.  *If you need a refill on your cardiac medications before your next appointment, please call your pharmacy*   Lab Work: Your physician recommends that you return for lab work in: 2 weeks. You need to have labs done when you are fasting.  You can come Monday through Friday 8:30 am to 12:00 pm and 1:15 to 4:30. You do not need to make an appointment as the order has already been placed. The labs you are going to have done are BMET and CBC.   If you have labs (blood work) drawn today and your tests are completely normal, you will receive your results only by: Marland Kitchen MyChart Message (if you have MyChart) OR . A paper copy in the mail If you have any lab test that is abnormal or we need to change your treatment, we will call you to review the results.   Testing/Procedures: None ordered   Follow-Up: At [facility], you and your health needs are our priority.  As part of our continuing mission to provide you with exceptional heart care, we have created designated Provider Care Teams.  These Care Teams include your primary Cardiologist (physician) and Advanced Practice Providers (APPs -  Physician Assistants and Nurse Practitioners) who all work together to provide you with the care you need, when you need it.  We recommend signing up for the patient portal called "MyChart".  Sign up information is provided on this After Visit Summary.  MyChart is used to connect with patients for Virtual Visits (Telemedicine).  Patients are able to view lab/test results, encounter notes, upcoming appointments, etc.  Non-urgent messages can be sent to your provider as well.   To learn more about what you can do with MyChart, go to [email or web address].    Your next appointment:   1 month(s)  The format for your next appointment:   In Person  Provider:   Jyl Heinz, MD   Other Instructions Apixaban oral tablets What is this medicine? APIXABAN (a PIX a ban) is an anticoagulant (blood thinner). It is used to lower the chance of stroke in people with a medical condition called atrial fibrillation. It is also used to treat or prevent blood clots in the lungs or in the veins. This medicine may be used for other purposes; ask your health care provider or pharmacist if you have questions. COMMON BRAND NAME(S): Eliquis What should I tell my health care provider before I take this medicine? They need to know if you have any of these conditions:  antiphospholipid antibody syndrome  bleeding disorders  bleeding in the brain  blood in your stools (black or tarry stools) or if you have blood in your vomit  history of blood clots  history of stomach bleeding  kidney disease  liver disease  mechanical heart valve  an unusual or allergic reaction to apixaban, other medicines, foods, dyes, or preservatives  pregnant or trying to get pregnant  breast-feeding How should I use this medicine? Take this medicine by mouth with a glass of water. Follow the directions on the prescription label. You can take it with or without food. If it upsets your stomach, take it with food. Take your medicine at regular intervals. Do not take it more often than directed. Do not stop taking except on your doctor's advice. Stopping this medicine may increase your risk of a blood clot.  Be sure to refill your prescription before you run out of medicine. Talk to your pediatrician regarding the use of this medicine in children. Special care may be needed. Overdosage: If you think you have taken too much of this medicine contact a poison control center or emergency room at once. NOTE: This medicine is only for you. Do not share this medicine with others. What if I miss a dose? If you miss a dose, take it as soon as you can. If it is almost time for your next dose, take  only that dose. Do not take double or extra doses. What may interact with this medicine? This medicine may interact with the following:  aspirin and aspirin-like medicines  certain medicines for fungal infections like ketoconazole and itraconazole  certain medicines for seizures like carbamazepine and phenytoin  certain medicines that treat or prevent blood clots like warfarin, enoxaparin, and dalteparin  clarithromycin  NSAIDs, medicines for pain and inflammation, like ibuprofen or naproxen  rifampin  ritonavir  St. John's wort This list may not describe all possible interactions. Give your health care provider a list of all the medicines, herbs, non-prescription drugs, or dietary supplements you use. Also tell them if you smoke, drink alcohol, or use illegal drugs. Some items may interact with your medicine. What should I watch for while using this medicine? Visit your healthcare professional for regular checks on your progress. You may need blood work done while you are taking this medicine. Your condition will be monitored carefully while you are receiving this medicine. It is important not to miss any appointments. Avoid sports and activities that might cause injury while you are using this medicine. Severe falls or injuries can cause unseen bleeding. Be careful when using sharp tools or knives. Consider using an Copy. Take special care brushing or flossing your teeth. Report any injuries, bruising, or red spots on the skin to your healthcare professional. If you are going to need surgery or other procedure, tell your healthcare professional that you are taking this medicine. Wear a medical ID bracelet or chain. Carry a card that describes your disease and details of your medicine and dosage times. What side effects may I notice from receiving this medicine? Side effects that you should report to your doctor or health care professional as soon as possible:  allergic  reactions like skin rash, itching or hives, swelling of the face, lips, or tongue  signs and symptoms of bleeding such as bloody or black, tarry stools; red or dark-brown urine; spitting up blood or brown material that looks like coffee grounds; red spots on the skin; unusual bruising or bleeding from the eye, gums, or nose  signs and symptoms of a blood clot such as chest pain; shortness of breath; pain, swelling, or warmth in the leg  signs and symptoms of a stroke such as changes in vision; confusion; trouble speaking or understanding; severe headaches; sudden numbness or weakness of the face, arm or leg; trouble walking; dizziness; loss of coordination This list may not describe all possible side effects. Call your doctor for medical advice about side effects. You may report side effects to FDA at 1-800-FDA-1088. Where should I keep my medicine? Keep out of the reach of children. Store at room temperature between 20 and 25 degrees C (68 and 77 degrees F). Throw away any unused medicine after the expiration date. NOTE: This sheet is a summary. It may not cover all possible information. If you have questions about this medicine,  talk to your doctor, pharmacist, or health care provider.  2020 Elsevier/Gold Standard (2018-02-19 17:39:34)

## 2020-03-17 NOTE — Progress Notes (Signed)
Cardiology Office Note:    Date:  03/17/2020   ID:  SENAI RAMNATH, DOB 05-07-1941, MRN [id number]  PCP:  Lillard Anes, MD  Cardiologist:  Jenean Lindau, MD   Referring MD: Lillard Anes,*    ASSESSMENT:    1. Orthostatic hypotension   2. Essential hypertension   3. Atrial fibrillation, unspecified type ([place])   4. 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 Primary prevention stressed with the patient.  Importance of compliance with diet medication stressed and she vocalized understanding. 2. Permanent atrial fibrillation:I discussed with the patient atrial fibrillation, disease process. Management and therapy including rate and rhythm control, anticoagulation benefits and potential risks were discussed extensively with the patient. Patient had multiple questions which were answered to patient's satisfaction. 3. She is willing to take anticoagulation for atrial fibrillation.  Benefits and potential is explained and she vocalized understanding.  We will initiate her on Eliquis 5 mg twice daily.  She will be back in 2 weeks for a Chem-7 and CBC. 4. Essential hypertension: Blood pressure stable and diet was emphasized. 5. Mixed dyslipidemia diabetes mellitus and obesity: I had an extensive discussion with the above diet and lifestyle modification and she promises to do better. 6. Her gait is stable.  She ambulates with a walker.  She tells me that she has not had any falls for the past several months and I discussed this especially in view of anticoagulation.  Follow-up appointment in a month or earlier if she has any concerns.   Medication Adjustments/Labs and Tests Ordered: Current medicines are reviewed at length with the patient today.  Concerns regarding medicines are outlined above.  No orders of the defined types were placed in this encounter.  No orders of the defined types were placed in  this encounter.    No chief complaint on file.    History of Present Illness:    AUDRIELLA BLAKELEY is a 79 y.o. female.  Patient has history of atrial fibrillation, essential hypertension dyslipidemia diabetes mellitus and morbid obesity.  She was admitted to the hospital with dehydration.  Subsequently she has felt better.  I saw her a few days ago.  I kept her off her diuretics.  She has felt very well.  She was much better than she saw me a few days ago.  She recently had blood work done by her primary care physician.  Her hemoglobin A1c is markedly elevated.  At the time of my evaluation, the patient is alert awake oriented and in no distress.  Past Medical History:  Diagnosis Date  . AKI (acute kidney injury) ([place]) 03/03/2020  . Atrial fibrillation ([place]) 03/26/2014  . Chest pain 02/14/2015  . Chronic diastolic CHF (congestive heart failure) ([place]) 03/03/2020  . COPD (chronic obstructive pulmonary disease) ([place]) 02/23/2020  . Depression   . 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 Dyspnea on exertion    Followed in Pulmonary clinic/ [facility]/ [place] - PFT's 03/07/12 FEV1  1.28 ( 80%) ratio 70 and DLCO 53 corrects to 104  ERV 39%  - 02/13/2013  Walked RA x 3 laps @ 185 ft each stopped due to end of study no desat  - Trial off ACEi 02/14/2013 > all symptoms resolved    . GERD (gastroesophageal reflux disease) 03/27/2013  . Glaucoma   . H/O: hysterectomy   . Heartburn 01/13/2019  . High risk medication  use 02/01/2012  . History of cardiac catheterization 02/01/2012  . HTN (hypertension)   . Hypercholesterolemia   . Hypertensive urgency 03/03/2020  . Morbid obesity ([place]) 02/18/2020  . Obesity, diabetes, and hypertension syndrome ([place]) 02/18/2020  . Orthostatic hypotension 02/23/2015  . Psoriatic arthritis ([place])   . 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 SOB (shortness of breath) on exertion   . Syncope 03/27/2014  . Vomiting 03/01/2020    Past Surgical History:   Procedure Laterality Date  . ABDOMINAL HYSTERECTOMY    . ABDOMINAL SURGERY    . APPENDECTOMY    . CARDIAC CATHETERIZATION    . JOINT REPLACEMENT     Both Knees  . TOTAL KNEE ARTHROPLASTY    . TUBAL LIGATION      Current Medications: Current Meds  Medication Sig  . ACCU-CHEK AVIVA PLUS test strip   . Accu-Chek Softclix Lancets lancets   . acetaminophen (TYLENOL) 325 MG tablet Take 2 tablets (650 mg total) by mouth every 4 (four) hours as needed for headache or mild pain.  . Alcohol Swabs (B-D SINGLE USE SWABS REGULAR) PADS   . amLODipine (NORVASC) 5 MG tablet Take 1 tablet (5 mg total) by mouth daily.  . Blood Glucose Calibration (ACCU-CHEK AVIVA) SOLN   . brimonidine (ALPHAGAN) 0.2 % ophthalmic solution Place 1 drop into both eyes 2 (two) times daily.   . Budeson-Glycopyrrol-Formoterol (BREZTRI AEROSPHERE) 160-9-4.8 MCG/ACT AERO Inhale 2 puffs into the lungs in the morning and at bedtime.  . cloNIDine (CATAPRES) 0.2 MG tablet Take 1 tablet (0.2 mg total) by mouth 2 (two) times daily.  . dorzolamide-timolol (COSOPT) 22.3-6.8 MG/ML ophthalmic solution Place 1 drop into both eyes 2 (two) times daily.   Marland Kitchen esomeprazole (NEXIUM) 40 MG capsule Take 40 mg by mouth daily.   Marland Kitchen glimepiride (AMARYL) 2 MG tablet Take 2 mg by mouth daily with breakfast.  . insulin degludec (TRESIBA FLEXTOUCH) 100 UNIT/ML SOPN FlexTouch Pen Inject 22 Units into the skin daily.   . Insulin Pen Needle (NOVOFINE PLUS) 32G X 4 MM MISC by Does not apply route.  . isosorbide mononitrate (IMDUR) 30 MG 24 hr tablet Take 1 tablet (30 mg total) by mouth daily.  Marland Kitchen latanoprost (XALATAN) 0.005 % ophthalmic solution Place 1 drop into both eyes every evening.   . ondansetron (ZOFRAN) 4 MG tablet Take 1 tablet (4 mg total) by mouth every 6 (six) hours as needed for nausea.  . pravastatin (PRAVACHOL) 40 MG tablet Take 40 mg by mouth daily.   . sertraline (ZOLOFT) 100 MG tablet Take 100 mg by mouth daily.   Marland Kitchen spironolactone  (ALDACTONE) 25 MG tablet Take 1 tablet (25 mg total) by mouth daily.     Allergies:   Ozempic (0.25 or 0.5 mg-dose) [semaglutide(0.25 or 0.5mg -dos)], Ace inhibitors, Codeine, and Metoprolol   Social History   Socioeconomic History  . Marital status: Legally Separated    Spouse name: Not on file  . Number of children: 2  . Years of education: 34  . Highest education level: Not on file  Occupational History  . Occupation: office work  . Occupation: CNA  Tobacco Use  . Smoking status: Never Smoker  . Smokeless tobacco: Never Used  Substance and Sexual Activity  . Alcohol use: No  . Drug use: No  . Sexual activity: Never  Other Topics Concern  . Not on file  Social History Narrative   Patient drinks about 6 cups of caffeine daily.  Patient is right handed.   Social Determinants of Health   Financial Resource Strain:   . Difficulty of Paying Living Expenses: Not on file  Food Insecurity:   . Worried About Charity fundraiser in the Last Year: Not on file  . Ran Out of Food in the Last Year: Not on file  Transportation Needs:   . Lack of Transportation (Medical): Not on file  . Lack of Transportation (Non-Medical): Not on file  Physical Activity:   . Days of Exercise per Week: Not on file  . Minutes of Exercise per Session: Not on file  Stress:   . Feeling of Stress : Not on file  Social Connections:   . Frequency of Communication with Friends and Family: Not on file  . Frequency of Social Gatherings with Friends and Family: Not on file  . Attends Religious Services: Not on file  . Active Member of Clubs or Organizations: Not on file  . Attends Archivist Meetings: Not on file  . Marital Status: Not on file     Family History: The patient's family history includes Diabetes in her cousin; Heart attack in her father; Heart disease in her brother; Leukemia in her cousin and paternal uncle; Stroke in her mother.  ROS:   Please see the history of present  illness.    All other systems reviewed and are negative.  EKGs/Labs/Other Studies Reviewed:    The following studies were reviewed today: IMPRESSIONS    1. Left ventricular ejection fraction, by estimation, is 55 to 60%. The  left ventricle has normal function. The left ventricle has no regional  wall motion abnormalities. There is mild concentric left ventricular  hypertrophy. Left ventricular diastolic  parameters are indeterminate.  2. Right ventricular systolic function is normal. The right ventricular  size is normal. There is normal pulmonary artery systolic pressure.  3. The mitral valve is normal in structure. Trivial mitral valve  regurgitation. No evidence of mitral stenosis.  4. The aortic valve is normal in structure. Aortic valve regurgitation is  not visualized. No aortic stenosis is present.  5. Mild calcification at the aortic root.  6. The inferior vena cava is normal in size with greater than 50%  respiratory variability, suggesting right atrial pressure of 3 mmHg.    Recent Labs: 03/09/2020: ALT 17; BNP 20.6; BUN 17; Creatinine, Ser 1.40; Hemoglobin 16.2; Platelets 420; Potassium 4.6; Sodium 140  Recent Lipid Panel    Component Value Date/Time   CHOL 182 02/18/2020 1437   TRIG 101 02/18/2020 1437   HDL 82 02/18/2020 1437   CHOLHDL 2.2 02/18/2020 1437   CHOLHDL 2.0 03/27/2014 0250   VLDL 16 03/27/2014 0250   LDLCALC 82 02/18/2020 1437    Physical Exam:    VS:  BP (!) 144/70   Pulse 74   Ht 4\' 9"  (1.448 m)   Wt 194 lb 3.2 oz (88.1 kg)   SpO2 96%   BMI 42.02 kg/m     Wt Readings from Last 3 Encounters:  03/17/20 194 lb 3.2 oz (88.1 kg)  03/10/20 190 lb 9.6 oz (86.5 kg)  03/09/20 189 lb 9.6 oz (86 kg)     GEN: Patient is in no acute distress HEENT: Normal NECK: No JVD; No carotid bruits LYMPHATICS: No lymphadenopathy CARDIAC: Hear sounds regular, 2/6 systolic murmur at the apex. RESPIRATORY:  Clear to auscultation without rales, wheezing  or rhonchi  ABDOMEN: Soft, non-tender, non-distended MUSCULOSKELETAL:  No edema; No deformity  SKIN:  Warm and dry NEUROLOGIC:  Alert and oriented x 3 PSYCHIATRIC:  Normal affect   Signed, Jenean Lindau, MD  03/17/2020 2:35 PM    [facility]

## 2020-03-18 ENCOUNTER — Encounter: Payer: Self-pay | Admitting: Legal Medicine

## 2020-03-18 ENCOUNTER — Ambulatory Visit (INDEPENDENT_AMBULATORY_CARE_PROVIDER_SITE_OTHER): Payer: Medicare HMO | Admitting: Legal Medicine

## 2020-03-18 VITALS — BP 110/70 | HR 90 | Temp 97.0°F | Resp 18 | Ht <= 58 in | Wt 187.0 lb

## 2020-03-18 DIAGNOSIS — J449 Chronic obstructive pulmonary disease, unspecified: Secondary | ICD-10-CM | POA: Diagnosis not present

## 2020-03-18 DIAGNOSIS — E1121 Type 2 diabetes mellitus with diabetic nephropathy: Secondary | ICD-10-CM | POA: Diagnosis not present

## 2020-03-18 DIAGNOSIS — D473 Essential (hemorrhagic) thrombocythemia: Secondary | ICD-10-CM | POA: Diagnosis not present

## 2020-03-18 DIAGNOSIS — E876 Hypokalemia: Secondary | ICD-10-CM | POA: Diagnosis not present

## 2020-03-18 DIAGNOSIS — N390 Urinary tract infection, site not specified: Secondary | ICD-10-CM | POA: Diagnosis not present

## 2020-03-18 DIAGNOSIS — I110 Hypertensive heart disease with heart failure: Secondary | ICD-10-CM | POA: Diagnosis not present

## 2020-03-18 DIAGNOSIS — I480 Paroxysmal atrial fibrillation: Secondary | ICD-10-CM | POA: Diagnosis not present

## 2020-03-18 DIAGNOSIS — D72829 Elevated white blood cell count, unspecified: Secondary | ICD-10-CM | POA: Diagnosis not present

## 2020-03-18 DIAGNOSIS — E119 Type 2 diabetes mellitus without complications: Secondary | ICD-10-CM | POA: Diagnosis not present

## 2020-03-18 DIAGNOSIS — J42 Unspecified chronic bronchitis: Secondary | ICD-10-CM

## 2020-03-18 DIAGNOSIS — I5032 Chronic diastolic (congestive) heart failure: Secondary | ICD-10-CM | POA: Diagnosis not present

## 2020-03-18 NOTE — Progress Notes (Signed)
Subjective:  Patient ID: Christina Bishop, female    DOB: [birth year]  Age: 79 y.o. MRN: [id number]  Chief Complaint  Patient presents with  . COPD  . Diabetes    HPI: 1 month follow up.  Patient present with type 2 diabetes.  Specifically, this is type 2, insulin dependent requiring diabetes, complicated by hypertension and hypercholesterolemia.  Compliance with treatment has been good; patient take medicines as directed, maintains diet and exercise regimen, follows up as directed, and is keeping glucose diary.  Date of  diagnosis 2010.  Depression screen has been performed.Tobacco screen nonsmoker. Current medicines for diabetes tresiba 14 units,glimipiride .  Patient is on none for renal protection and pravasttin for cholesterol control.  Patient performs foot exams daily and last ophthalmologic exam was no, she has cut down tresiba to 14 units qd..  Patient presents with diagnosis of COPD.  It is not secondary to prolonged asthma.  Diagnosis 10  Treatment includes brextri.  The diagnosis has not been hospitalized for this diagnosis. Last na.  Patient is compliant with medicines with regular use of medicines.  The CHF is under good control and atrialf ibrilliation is controlled and she is on eliquis. Current Outpatient Medications on File Prior to Visit  Medication Sig Dispense Refill  . ACCU-CHEK AVIVA PLUS test strip     . Accu-Chek Softclix Lancets lancets     . acetaminophen (TYLENOL) 325 MG tablet Take 2 tablets (650 mg total) by mouth every 4 (four) hours as needed for headache or mild pain.    . Alcohol Swabs (B-D SINGLE USE SWABS REGULAR) PADS     . amLODipine (NORVASC) 5 MG tablet Take 1 tablet (5 mg total) by mouth daily. 90 tablet 3  . apixaban (ELIQUIS) 5 MG TABS tablet Take 1 tablet (5 mg total) by mouth 2 (two) times daily. 60 tablet 6  . Blood Glucose Calibration (ACCU-CHEK AVIVA) SOLN     . brimonidine (ALPHAGAN) 0.2 % ophthalmic solution Place 1 drop into both eyes 2  (two) times daily.     . Budeson-Glycopyrrol-Formoterol (BREZTRI AEROSPHERE) 160-9-4.8 MCG/ACT AERO Inhale 2 puffs into the lungs in the morning and at bedtime. 10.7 g 5  . cloNIDine (CATAPRES) 0.2 MG tablet Take 1 tablet (0.2 mg total) by mouth 2 (two) times daily. 180 tablet 3  . dorzolamide-timolol (COSOPT) 22.3-6.8 MG/ML ophthalmic solution Place 1 drop into both eyes 2 (two) times daily.     Marland Kitchen esomeprazole (NEXIUM) 40 MG capsule Take 40 mg by mouth daily.     Marland Kitchen glimepiride (AMARYL) 2 MG tablet Take 2 mg by mouth daily with breakfast.    . insulin degludec (TRESIBA FLEXTOUCH) 100 UNIT/ML SOPN FlexTouch Pen Inject 14 Units into the skin daily.    . Insulin Pen Needle (NOVOFINE PLUS) 32G X 4 MM MISC by Does not apply route.    . isosorbide mononitrate (IMDUR) 30 MG 24 hr tablet Take 1 tablet (30 mg total) by mouth daily. 30 tablet 6  . latanoprost (XALATAN) 0.005 % ophthalmic solution Place 1 drop into both eyes every evening.     . ondansetron (ZOFRAN) 4 MG tablet Take 1 tablet (4 mg total) by mouth every 6 (six) hours as needed for nausea. 20 tablet 0  . pravastatin (PRAVACHOL) 40 MG tablet Take 40 mg by mouth daily.     . sertraline (ZOLOFT) 100 MG tablet Take 100 mg by mouth daily.     Marland Kitchen spironolactone (ALDACTONE) 25 MG tablet Take  1 tablet (25 mg total) by mouth daily. (Patient not taking: Reported on 03/18/2020) 90 tablet 3   No current facility-administered medications on file prior to visit.   Past Medical History:  Diagnosis Date  . AKI (acute kidney injury) ([place]) 03/03/2020  . Atrial fibrillation ([place]) 03/26/2014  . Chest pain 02/14/2015  . Chronic diastolic CHF (congestive heart failure) ([place]) 03/03/2020  . COPD (chronic obstructive pulmonary disease) ([place]) 02/23/2020  . Depression   . 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 Dyspnea on exertion    Followed in Pulmonary clinic/ [facility]/ [place] - PFT's 03/07/12 FEV1  1.28 ( 80%) ratio 70 and DLCO 53 corrects to 104  ERV 39%   - 02/13/2013  Walked RA x 3 laps @ 185 ft each stopped due to end of study no desat  - Trial off ACEi 02/14/2013 > all symptoms resolved    . GERD (gastroesophageal reflux disease) 03/27/2013  . Glaucoma   . H/O: hysterectomy   . Heartburn 01/13/2019  . High risk medication use 02/01/2012  . History of cardiac catheterization 02/01/2012  . HTN (hypertension)   . Hypercholesterolemia   . Hypertensive urgency 03/03/2020  . Morbid obesity ([place]) 02/18/2020  . Obesity, diabetes, and hypertension syndrome ([place]) 02/18/2020  . Orthostatic hypotension 02/23/2015  . Psoriatic arthritis ([place])   . 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 SOB (shortness of breath) on exertion   . Syncope 03/27/2014  . Vomiting 03/01/2020   Past Surgical History:  Procedure Laterality Date  . ABDOMINAL HYSTERECTOMY    . ABDOMINAL SURGERY    . APPENDECTOMY    . CARDIAC CATHETERIZATION    . JOINT REPLACEMENT     Both Knees  . TOTAL KNEE ARTHROPLASTY    . TUBAL LIGATION      Family History  Problem Relation Age of Onset  . Stroke Mother   . Heart attack Father   . Heart disease Brother   . Leukemia Paternal Uncle   . Diabetes Cousin   . Leukemia Cousin    Social History   Socioeconomic History  . Marital status: Legally Separated    Spouse name: Not on file  . Number of children: 2  . Years of education: 34  . Highest education level: Not on file  Occupational History  . Occupation: office work  . Occupation: CNA  Tobacco Use  . Smoking status: Never Smoker  . Smokeless tobacco: Never Used  Substance and Sexual Activity  . Alcohol use: Yes    Alcohol/week: 2.0 standard drinks    Types: 2 Standard drinks or equivalent per week    Comment: ocassionally  . Drug use: No  . Sexual activity: Never  Other Topics Concern  . Not on file  Social History Narrative   Patient drinks about 6 cups of caffeine daily.   Patient is right handed.   Social Determinants of Health   Financial  Resource Strain:   . Difficulty of Paying Living Expenses: Not on file  Food Insecurity:   . Worried About Charity fundraiser in the Last Year: Not on file  . Ran Out of Food in the Last Year: Not on file  Transportation Needs:   . Lack of Transportation (Medical): Not on file  . Lack of Transportation (Non-Medical): Not on file  Physical Activity:   . Days of Exercise per Week: Not on file  . Minutes of Exercise per Session: Not on file  Stress:   .  Feeling of Stress : Not on file  Social Connections:   . Frequency of Communication with Friends and Family: Not on file  . Frequency of Social Gatherings with Friends and Family: Not on file  . Attends Religious Services: Not on file  . Active Member of Clubs or Organizations: Not on file  . Attends Archivist Meetings: Not on file  . Marital Status: Not on file    Review of Systems  Constitutional: Negative.   HENT: Negative.   Eyes: Negative.   Respiratory: Positive for shortness of breath.   Cardiovascular: Negative for chest pain, palpitations and leg swelling.  Gastrointestinal: Negative.   Genitourinary: Negative for dysuria.  Musculoskeletal: Negative.   Skin: Negative.   Neurological: Negative.      Objective:  BP 110/70   Pulse 90   Temp (!) 97 F (36.1 C)   Resp 18   Ht 4\' 9"  (1.448 m)   Wt 187 lb (84.8 kg)   BMI 40.47 kg/m   BP/Weight 03/18/2020 03/17/2020 5/39/7673  Systolic BP 419 379 024  Diastolic BP 70 70 75  Wt. (Lbs) 187 194.2 190.6  BMI 40.47 42.02 41.25    Physical Exam Vitals reviewed.  HENT:     Head: Normocephalic.     Nose: Nose normal.  Eyes:     Extraocular Movements: Extraocular movements intact.     Conjunctiva/sclera: Conjunctivae normal.     Pupils: Pupils are equal, round, and reactive to light.  Cardiovascular:     Rate and Rhythm: Normal rate. Rhythm irregular.     Heart sounds: Normal heart sounds.  Pulmonary:     Effort: Pulmonary effort is normal.   Musculoskeletal:        General: Normal range of motion.     Cervical back: Normal range of motion and neck supple.  Skin:    General: Skin is warm and dry.     Capillary Refill: Capillary refill takes less than 2 seconds.  Neurological:     General: No focal deficit present.     Mental Status: Mental status is at baseline.     \\  Lab Results  Component Value Date   WBC 12.1 (H) 03/09/2020   HGB 16.2 (H) 03/09/2020   HCT 47.9 (H) 03/09/2020   PLT 420 03/09/2020   GLUCOSE 133 (H) 03/09/2020   CHOL 182 02/18/2020   TRIG 101 02/18/2020   HDL 82 02/18/2020   LDLCALC 82 02/18/2020   ALT 17 03/09/2020   AST 26 03/09/2020   NA 140 03/09/2020   K 4.6 03/09/2020   CL 101 03/09/2020   CREATININE 1.40 (H) 03/09/2020   BUN 17 03/09/2020   CO2 25 03/09/2020   TSH 4.010 03/26/2014   HGBA1C 10.0 (H) 02/18/2020      Assessment & Plan:   1. Diabetic glomerulopathy [facility]) An individual care plan for diabetes was established and reinforced today.  The patient's status was assessed using clinical findings on exam, labs and diagnostic testing. Patient success at meeting goals based on disease specific evidence-based guidelines and found to be good controlled. Medications were assessed and patient's understanding of the medical issues , including barriers were assessed. Recommend adherence to a diabetic diet, a graduated exercise program, HgbA1c level is checked quarterly, and urine microalbumin performed yearly .  Annual mono-filament sensation testing performed. Lower blood pressure and control hyperlipidemia is important. Get annual eye exams and annual flu shots and smoking cessation discussed.  Self management goals were discussed.  2. Chronic bronchitis, unspecified chronic bronchitis type [facility]) An individualize plan was formulated for care of COPD.  Treatment is evidence based.  She will continue on inhalers, avoid smoking and smoke.  Regular exercise with help with dyspnea. Routine  follow ups and medication compliance is needed.         Follow-up: Return in about 3 months (around 06/17/2020) for diabetes.  An After Visit Summary was printed and given to the patient.  [place] [phone]

## 2020-03-23 DIAGNOSIS — E785 Hyperlipidemia, unspecified: Secondary | ICD-10-CM

## 2020-03-23 DIAGNOSIS — E119 Type 2 diabetes mellitus without complications: Secondary | ICD-10-CM | POA: Diagnosis not present

## 2020-03-23 DIAGNOSIS — Z9181 History of falling: Secondary | ICD-10-CM

## 2020-03-23 DIAGNOSIS — Z794 Long term (current) use of insulin: Secondary | ICD-10-CM

## 2020-03-23 DIAGNOSIS — J449 Chronic obstructive pulmonary disease, unspecified: Secondary | ICD-10-CM | POA: Diagnosis not present

## 2020-03-23 DIAGNOSIS — E876 Hypokalemia: Secondary | ICD-10-CM | POA: Diagnosis not present

## 2020-03-23 DIAGNOSIS — H409 Unspecified glaucoma: Secondary | ICD-10-CM

## 2020-03-23 DIAGNOSIS — D72829 Elevated white blood cell count, unspecified: Secondary | ICD-10-CM

## 2020-03-23 DIAGNOSIS — N390 Urinary tract infection, site not specified: Secondary | ICD-10-CM | POA: Diagnosis not present

## 2020-03-23 DIAGNOSIS — I5032 Chronic diastolic (congestive) heart failure: Secondary | ICD-10-CM | POA: Diagnosis not present

## 2020-03-23 DIAGNOSIS — F329 Major depressive disorder, single episode, unspecified: Secondary | ICD-10-CM

## 2020-03-23 DIAGNOSIS — I110 Hypertensive heart disease with heart failure: Secondary | ICD-10-CM | POA: Diagnosis not present

## 2020-03-23 DIAGNOSIS — I480 Paroxysmal atrial fibrillation: Secondary | ICD-10-CM

## 2020-03-23 DIAGNOSIS — D473 Essential (hemorrhagic) thrombocythemia: Secondary | ICD-10-CM | POA: Diagnosis not present

## 2020-03-23 DIAGNOSIS — Z96653 Presence of artificial knee joint, bilateral: Secondary | ICD-10-CM

## 2020-03-23 DIAGNOSIS — E7800 Pure hypercholesterolemia, unspecified: Secondary | ICD-10-CM

## 2020-03-28 ENCOUNTER — Ambulatory Visit: Payer: Medicare HMO | Admitting: Legal Medicine

## 2020-03-29 DIAGNOSIS — J449 Chronic obstructive pulmonary disease, unspecified: Secondary | ICD-10-CM | POA: Diagnosis not present

## 2020-03-29 DIAGNOSIS — D72829 Elevated white blood cell count, unspecified: Secondary | ICD-10-CM | POA: Diagnosis not present

## 2020-03-29 DIAGNOSIS — N390 Urinary tract infection, site not specified: Secondary | ICD-10-CM | POA: Diagnosis not present

## 2020-03-29 DIAGNOSIS — E119 Type 2 diabetes mellitus without complications: Secondary | ICD-10-CM | POA: Diagnosis not present

## 2020-03-29 DIAGNOSIS — I5032 Chronic diastolic (congestive) heart failure: Secondary | ICD-10-CM | POA: Diagnosis not present

## 2020-03-29 DIAGNOSIS — D473 Essential (hemorrhagic) thrombocythemia: Secondary | ICD-10-CM | POA: Diagnosis not present

## 2020-03-29 DIAGNOSIS — I480 Paroxysmal atrial fibrillation: Secondary | ICD-10-CM | POA: Diagnosis not present

## 2020-03-29 DIAGNOSIS — E876 Hypokalemia: Secondary | ICD-10-CM | POA: Diagnosis not present

## 2020-03-29 DIAGNOSIS — I110 Hypertensive heart disease with heart failure: Secondary | ICD-10-CM | POA: Diagnosis not present

## 2020-04-06 DIAGNOSIS — D473 Essential (hemorrhagic) thrombocythemia: Secondary | ICD-10-CM | POA: Diagnosis not present

## 2020-04-06 DIAGNOSIS — I5032 Chronic diastolic (congestive) heart failure: Secondary | ICD-10-CM | POA: Diagnosis not present

## 2020-04-06 DIAGNOSIS — D72829 Elevated white blood cell count, unspecified: Secondary | ICD-10-CM | POA: Diagnosis not present

## 2020-04-06 DIAGNOSIS — I480 Paroxysmal atrial fibrillation: Secondary | ICD-10-CM | POA: Diagnosis not present

## 2020-04-06 DIAGNOSIS — J449 Chronic obstructive pulmonary disease, unspecified: Secondary | ICD-10-CM | POA: Diagnosis not present

## 2020-04-06 DIAGNOSIS — I110 Hypertensive heart disease with heart failure: Secondary | ICD-10-CM | POA: Diagnosis not present

## 2020-04-06 DIAGNOSIS — E119 Type 2 diabetes mellitus without complications: Secondary | ICD-10-CM | POA: Diagnosis not present

## 2020-04-06 DIAGNOSIS — E876 Hypokalemia: Secondary | ICD-10-CM | POA: Diagnosis not present

## 2020-04-06 DIAGNOSIS — N390 Urinary tract infection, site not specified: Secondary | ICD-10-CM | POA: Diagnosis not present

## 2020-04-08 DIAGNOSIS — E119 Type 2 diabetes mellitus without complications: Secondary | ICD-10-CM | POA: Diagnosis not present

## 2020-04-08 DIAGNOSIS — J449 Chronic obstructive pulmonary disease, unspecified: Secondary | ICD-10-CM | POA: Diagnosis not present

## 2020-04-08 DIAGNOSIS — N390 Urinary tract infection, site not specified: Secondary | ICD-10-CM | POA: Diagnosis not present

## 2020-04-08 DIAGNOSIS — I480 Paroxysmal atrial fibrillation: Secondary | ICD-10-CM | POA: Diagnosis not present

## 2020-04-08 DIAGNOSIS — I5032 Chronic diastolic (congestive) heart failure: Secondary | ICD-10-CM | POA: Diagnosis not present

## 2020-04-08 DIAGNOSIS — D72829 Elevated white blood cell count, unspecified: Secondary | ICD-10-CM | POA: Diagnosis not present

## 2020-04-08 DIAGNOSIS — I110 Hypertensive heart disease with heart failure: Secondary | ICD-10-CM | POA: Diagnosis not present

## 2020-04-08 DIAGNOSIS — E876 Hypokalemia: Secondary | ICD-10-CM | POA: Diagnosis not present

## 2020-04-08 DIAGNOSIS — D473 Essential (hemorrhagic) thrombocythemia: Secondary | ICD-10-CM | POA: Diagnosis not present

## 2020-04-11 DIAGNOSIS — E1121 Type 2 diabetes mellitus with diabetic nephropathy: Secondary | ICD-10-CM | POA: Diagnosis not present

## 2020-04-11 DIAGNOSIS — I4891 Unspecified atrial fibrillation: Secondary | ICD-10-CM | POA: Diagnosis not present

## 2020-04-11 DIAGNOSIS — I10 Essential (primary) hypertension: Secondary | ICD-10-CM | POA: Diagnosis not present

## 2020-04-11 DIAGNOSIS — Z713 Dietary counseling and surveillance: Secondary | ICD-10-CM | POA: Diagnosis not present

## 2020-04-11 LAB — CBC WITH DIFFERENTIAL/PLATELET
Basophils Absolute: 0.1 10*3/uL (ref 0.0–0.2)
Basos: 2 %
EOS (ABSOLUTE): 0.3 10*3/uL (ref 0.0–0.4)
Eos: 5 %
Hematocrit: 40.8 % (ref 34.0–46.6)
Hemoglobin: 13.9 g/dL (ref 11.1–15.9)
Immature Grans (Abs): 0 10*3/uL (ref 0.0–0.1)
Immature Granulocytes: 0 %
Lymphocytes Absolute: 2 10*3/uL (ref 0.7–3.1)
Lymphs: 33 %
MCH: 29.2 pg (ref 26.6–33.0)
MCHC: 34.1 g/dL (ref 31.5–35.7)
MCV: 86 fL (ref 79–97)
Monocytes Absolute: 0.6 10*3/uL (ref 0.1–0.9)
Monocytes: 9 %
Neutrophils Absolute: 3.1 10*3/uL (ref 1.4–7.0)
Neutrophils: 51 %
Platelets: 302 10*3/uL (ref 150–450)
RBC: 4.76 x10E6/uL (ref 3.77–5.28)
RDW: 13.2 % (ref 11.7–15.4)
WBC: 6.1 10*3/uL (ref 3.4–10.8)

## 2020-04-11 LAB — BASIC METABOLIC PANEL
BUN/Creatinine Ratio: 13 (ref 12–28)
BUN: 12 mg/dL (ref 8–27)
CO2: 28 mmol/L (ref 20–29)
Calcium: 9.1 mg/dL (ref 8.7–10.3)
Chloride: 103 mmol/L (ref 96–106)
Creatinine, Ser: 0.95 mg/dL (ref 0.57–1.00)
GFR calc Af Amer: 67 mL/min/{1.73_m2} (ref >59)
GFR calc non Af Amer: 58 mL/min/{1.73_m2} — ABNORMAL LOW (ref >59)
Glucose: 86 mg/dL (ref 65–99)
Potassium: 4.3 mmol/L (ref 3.5–5.2)
Sodium: 142 mmol/L (ref 134–144)

## 2020-04-12 ENCOUNTER — Telehealth: Payer: Self-pay | Admitting: Cardiology

## 2020-04-12 DIAGNOSIS — I110 Hypertensive heart disease with heart failure: Secondary | ICD-10-CM | POA: Diagnosis not present

## 2020-04-12 DIAGNOSIS — D473 Essential (hemorrhagic) thrombocythemia: Secondary | ICD-10-CM | POA: Diagnosis not present

## 2020-04-12 DIAGNOSIS — E876 Hypokalemia: Secondary | ICD-10-CM | POA: Diagnosis not present

## 2020-04-12 DIAGNOSIS — J449 Chronic obstructive pulmonary disease, unspecified: Secondary | ICD-10-CM | POA: Diagnosis not present

## 2020-04-12 DIAGNOSIS — D72829 Elevated white blood cell count, unspecified: Secondary | ICD-10-CM | POA: Diagnosis not present

## 2020-04-12 DIAGNOSIS — I480 Paroxysmal atrial fibrillation: Secondary | ICD-10-CM | POA: Diagnosis not present

## 2020-04-12 DIAGNOSIS — N390 Urinary tract infection, site not specified: Secondary | ICD-10-CM | POA: Diagnosis not present

## 2020-04-12 DIAGNOSIS — E119 Type 2 diabetes mellitus without complications: Secondary | ICD-10-CM | POA: Diagnosis not present

## 2020-04-12 DIAGNOSIS — I5032 Chronic diastolic (congestive) heart failure: Secondary | ICD-10-CM | POA: Diagnosis not present

## 2020-04-12 NOTE — Telephone Encounter (Signed)
Patient returning call for lab results. 

## 2020-04-12 NOTE — Telephone Encounter (Signed)
Called patient informed her of results.  

## 2020-04-19 ENCOUNTER — Other Ambulatory Visit: Payer: Self-pay

## 2020-04-19 ENCOUNTER — Encounter: Payer: Self-pay | Admitting: Cardiology

## 2020-04-19 ENCOUNTER — Ambulatory Visit: Payer: Medicare HMO | Admitting: Cardiology

## 2020-04-19 DIAGNOSIS — E7800 Pure hypercholesterolemia, unspecified: Secondary | ICD-10-CM

## 2020-04-19 DIAGNOSIS — I480 Paroxysmal atrial fibrillation: Secondary | ICD-10-CM | POA: Diagnosis not present

## 2020-04-19 DIAGNOSIS — Z23 Encounter for immunization: Secondary | ICD-10-CM

## 2020-04-19 DIAGNOSIS — E1121 Type 2 diabetes mellitus with diabetic nephropathy: Secondary | ICD-10-CM | POA: Diagnosis not present

## 2020-04-19 NOTE — Patient Instructions (Signed)

## 2020-04-19 NOTE — Progress Notes (Signed)
Cardiology Office Note:    Date:  04/19/2020   ID:  TERRILL WAUTERS, DOB 02-01-1941, MRN [id number]  PCP:  Lillard Anes, MD  Cardiologist:  Jenean Lindau, MD   Referring MD: Lillard Anes,*    ASSESSMENT:    1. Morbid obesity ([place])   2. PAF (paroxysmal atrial fibrillation) ([place])   3. Diabetic glomerulopathy ([place])   4. Hypercholesterolemia    PLAN:    In order of problems listed above:  1. Primary prevention stressed with the patient. Importance of compliance with diet medication stressed and she vocalized understanding. 2. Paroxysmal atrial fibrillation:I discussed with the patient atrial fibrillation, disease process. Management and therapy including rate and rhythm control, anticoagulation benefits and potential risks were discussed extensively with the patient. Patient had multiple questions which were answered to patient's satisfaction. Patient is tolerating anticoagulation well and happy about it. 3. Essential hypertension: Blood pressure stable 4. Mixed dyslipidemia and diabetes mellitus: Diet was urged. Her last hemoglobin A1c has been greater than 10 and I cautioned her about this. 5. Morbid obesity: Diet was emphasized. She tells me that she was under stress and did not do well with her diet and she is going to do better in the coming months. She plans to move to [facility] and find a local cardiologist there and I wished her well. I told her we are available for help in any way we possibly can and we will be available to take care of her until she is established by her cardiologist in the new area. She will be seen in follow-up appointment on a as needed basis.    Medication Adjustments/Labs and Tests Ordered: Current medicines are reviewed at length with the patient today.  Concerns regarding medicines are outlined above.  No orders of the defined types were placed in this encounter.  No orders of the defined types were placed in this  encounter.    No chief complaint on file.    History of Present Illness:    GLADIS SOLEY is a 79 y.o. female. Patient has past medical history of essential hypertension, dyslipidemia, diabetes mellitus, paroxysmal atrial fibrillation and morbid obesity. She denies any problems at this time and takes care of activities of daily living. No chest pain orthopnea or PND. At the time of my evaluation, the patient is alert awake oriented and in no distress.  Past Medical History:  Diagnosis Date  . AKI (acute kidney injury) ([place]) 03/03/2020  . Atrial fibrillation ([place]) 03/26/2014  . Chest pain 02/14/2015  . Chronic diastolic CHF (congestive heart failure) ([place]) 03/03/2020  . COPD (chronic obstructive pulmonary disease) ([place]) 02/23/2020  . Depression   . 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 Dyspnea on exertion    Followed in Pulmonary clinic/ [facility]/ [place] - PFT's 03/07/12 FEV1  1.28 ( 80%) ratio 70 and DLCO 53 corrects to 104  ERV 39%  - 02/13/2013  Walked RA x 3 laps @ 185 ft each stopped due to end of study no desat  - Trial off ACEi 02/14/2013 > all symptoms resolved    . GERD (gastroesophageal reflux disease) 03/27/2013  . Glaucoma   . H/O: hysterectomy   . Heartburn 01/13/2019  . High risk medication use 02/01/2012  . History of cardiac catheterization 02/01/2012  . HTN (hypertension)   . Hypercholesterolemia   . Hypertensive urgency 03/03/2020  . Morbid obesity ([place]) 02/18/2020  . Obesity, diabetes, and hypertension syndrome ([place]) 02/18/2020  . Orthostatic hypotension 02/23/2015  .  Psoriatic arthritis ([place])   . 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 SOB (shortness of breath) on exertion   . Syncope 03/27/2014  . Vomiting 03/01/2020    Past Surgical History:  Procedure Laterality Date  . ABDOMINAL HYSTERECTOMY    . ABDOMINAL SURGERY    . APPENDECTOMY    . CARDIAC CATHETERIZATION    . JOINT REPLACEMENT     Both Knees  . TOTAL KNEE ARTHROPLASTY     . TUBAL LIGATION      Current Medications: Current Meds  Medication Sig  . ACCU-CHEK AVIVA PLUS test strip   . Accu-Chek Softclix Lancets lancets   . acetaminophen (TYLENOL) 325 MG tablet Take 2 tablets (650 mg total) by mouth every 4 (four) hours as needed for headache or mild pain.  . Alcohol Swabs (B-D SINGLE USE SWABS REGULAR) PADS   . amLODipine (NORVASC) 5 MG tablet Take 1 tablet (5 mg total) by mouth daily.  Marland Kitchen apixaban (ELIQUIS) 5 MG TABS tablet Take 1 tablet (5 mg total) by mouth 2 (two) times daily.  . Blood Glucose Calibration (ACCU-CHEK AVIVA) SOLN   . brimonidine (ALPHAGAN) 0.2 % ophthalmic solution Place 1 drop into both eyes 2 (two) times daily.   . Budeson-Glycopyrrol-Formoterol (BREZTRI AEROSPHERE) 160-9-4.8 MCG/ACT AERO Inhale 2 puffs into the lungs in the morning and at bedtime.  . cloNIDine (CATAPRES) 0.2 MG tablet Take 1 tablet (0.2 mg total) by mouth 2 (two) times daily.  . dorzolamide-timolol (COSOPT) 22.3-6.8 MG/ML ophthalmic solution Place 1 drop into both eyes 2 (two) times daily.   Marland Kitchen esomeprazole (NEXIUM) 40 MG capsule Take 40 mg by mouth daily.   Marland Kitchen glimepiride (AMARYL) 2 MG tablet Take 2 mg by mouth daily with breakfast.  . insulin degludec (TRESIBA FLEXTOUCH) 100 UNIT/ML SOPN FlexTouch Pen Inject 14 Units into the skin daily.  . Insulin Pen Needle (NOVOFINE PLUS) 32G X 4 MM MISC by Does not apply route.  . isosorbide mononitrate (IMDUR) 30 MG 24 hr tablet Take 1 tablet (30 mg total) by mouth daily.  Marland Kitchen latanoprost (XALATAN) 0.005 % ophthalmic solution Place 1 drop into both eyes every evening.   . ondansetron (ZOFRAN) 4 MG tablet Take 1 tablet (4 mg total) by mouth every 6 (six) hours as needed for nausea.  . pravastatin (PRAVACHOL) 40 MG tablet Take 40 mg by mouth daily.   . sertraline (ZOLOFT) 100 MG tablet Take 100 mg by mouth daily.   Marland Kitchen spironolactone (ALDACTONE) 25 MG tablet Take 1 tablet (25 mg total) by mouth daily.     Allergies:   Ozempic (0.25 or  0.5 mg-dose) [semaglutide(0.25 or 0.5mg -dos)], Ace inhibitors, Codeine, and Metoprolol   Social History   Socioeconomic History  . Marital status: Legally Separated    Spouse name: Not on file  . Number of children: 2  . Years of education: 8  . Highest education level: Not on file  Occupational History  . Occupation: office work  . Occupation: CNA  Tobacco Use  . Smoking status: Never Smoker  . Smokeless tobacco: Never Used  Substance and Sexual Activity  . Alcohol use: Yes    Alcohol/week: 2.0 standard drinks    Types: 2 Standard drinks or equivalent per week    Comment: ocassionally  . Drug use: No  . Sexual activity: Never  Other Topics Concern  . Not on file  Social History Narrative   Patient drinks about 6 cups of caffeine daily.   Patient is  right handed.   Social Determinants of Health   Financial Resource Strain:   . Difficulty of Paying Living Expenses: Not on file  Food Insecurity:   . Worried About Charity fundraiser in the Last Year: Not on file  . Ran Out of Food in the Last Year: Not on file  Transportation Needs:   . Lack of Transportation (Medical): Not on file  . Lack of Transportation (Non-Medical): Not on file  Physical Activity:   . Days of Exercise per Week: Not on file  . Minutes of Exercise per Session: Not on file  Stress:   . Feeling of Stress : Not on file  Social Connections:   . Frequency of Communication with Friends and Family: Not on file  . Frequency of Social Gatherings with Friends and Family: Not on file  . Attends Religious Services: Not on file  . Active Member of Clubs or Organizations: Not on file  . Attends Archivist Meetings: Not on file  . Marital Status: Not on file     Family History: The patient's family history includes Diabetes in her cousin; Heart attack in her father; Heart disease in her brother; Leukemia in her cousin and paternal uncle; Stroke in her mother.  ROS:   Please see the history of  present illness.    All other systems reviewed and are negative.  EKGs/Labs/Other Studies Reviewed:    The following studies were reviewed today: I discussed my findings with the patient at extensive length.   Recent Labs: 03/09/2020: ALT 17; BNP 20.6 04/11/2020: BUN 12; Creatinine, Ser 0.95; Hemoglobin 13.9; Platelets 302; Potassium 4.3; Sodium 142  Recent Lipid Panel    Component Value Date/Time   CHOL 182 02/18/2020 1437   TRIG 101 02/18/2020 1437   HDL 82 02/18/2020 1437   CHOLHDL 2.2 02/18/2020 1437   CHOLHDL 2.0 03/27/2014 0250   VLDL 16 03/27/2014 0250   LDLCALC 82 02/18/2020 1437    Physical Exam:    VS:  BP 124/68   Pulse 68   Ht 4\' 9"  (1.448 m)   Wt 194 lb 3.2 oz (88.1 kg)   SpO2 95%   BMI 42.02 kg/m     Wt Readings from Last 3 Encounters:  04/19/20 194 lb 3.2 oz (88.1 kg)  03/18/20 187 lb (84.8 kg)  03/17/20 194 lb 3.2 oz (88.1 kg)     GEN: Patient is in no acute distress HEENT: Normal NECK: No JVD; No carotid bruits LYMPHATICS: No lymphadenopathy CARDIAC: Hear sounds regular, 2/6 systolic murmur at the apex. RESPIRATORY:  Clear to auscultation without rales, wheezing or rhonchi  ABDOMEN: Soft, non-tender, non-distended MUSCULOSKELETAL:  No edema; No deformity  SKIN: Warm and dry NEUROLOGIC:  Alert and oriented x 3 PSYCHIATRIC:  Normal affect   Signed, Jenean Lindau, MD  04/19/2020 2:11 PM    [facility]

## 2020-04-20 ENCOUNTER — Other Ambulatory Visit: Payer: Self-pay | Admitting: Legal Medicine

## 2020-04-20 DIAGNOSIS — I10 Essential (primary) hypertension: Secondary | ICD-10-CM

## 2020-04-20 DIAGNOSIS — E1159 Type 2 diabetes mellitus with other circulatory complications: Secondary | ICD-10-CM

## 2020-04-20 DIAGNOSIS — I152 Hypertension secondary to endocrine disorders: Secondary | ICD-10-CM

## 2020-04-20 DIAGNOSIS — E669 Obesity, unspecified: Secondary | ICD-10-CM

## 2020-04-20 MED ORDER — TRESIBA FLEXTOUCH 100 UNIT/ML ~~LOC~~ SOPN: 14.0000 [IU] | PEN_INJECTOR | Freq: Every day | SUBCUTANEOUS | 3 refills | Status: DC

## 2020-04-20 MED ORDER — CLONIDINE HCL 0.2 MG PO TABS: 0.2000 mg | ORAL_TABLET | Freq: Two times a day (BID) | ORAL | 2 refills | Status: DC

## 2020-04-21 ENCOUNTER — Other Ambulatory Visit: Payer: Self-pay

## 2020-04-21 DIAGNOSIS — I10 Essential (primary) hypertension: Secondary | ICD-10-CM

## 2020-04-21 DIAGNOSIS — E1159 Type 2 diabetes mellitus with other circulatory complications: Secondary | ICD-10-CM

## 2020-04-21 DIAGNOSIS — E669 Obesity, unspecified: Secondary | ICD-10-CM

## 2020-04-21 DIAGNOSIS — I152 Hypertension secondary to endocrine disorders: Secondary | ICD-10-CM

## 2020-04-21 MED ORDER — CLONIDINE HCL 0.2 MG PO TABS: 0.2000 mg | ORAL_TABLET | Freq: Two times a day (BID) | ORAL | 2 refills | Status: AC

## 2020-04-21 MED ORDER — TRESIBA FLEXTOUCH 100 UNIT/ML ~~LOC~~ SOPN: 14.0000 [IU] | PEN_INJECTOR | Freq: Every day | SUBCUTANEOUS | 3 refills | Status: AC

## 2020-05-10 ENCOUNTER — Ambulatory Visit: Payer: Medicare HMO | Admitting: Legal Medicine

## 2020-05-24 ENCOUNTER — Ambulatory Visit: Payer: Medicare HMO | Admitting: Legal Medicine

## 2020-11-09 ENCOUNTER — Other Ambulatory Visit: Payer: Self-pay | Admitting: Legal Medicine

## 2020-11-09 DIAGNOSIS — I5032 Chronic diastolic (congestive) heart failure: Secondary | ICD-10-CM

## 2023-12-24 DEATH — deceased
